# Patient Record
Sex: Male | Born: 1935 | Race: White | Hispanic: No | Marital: Married | Smoking: Former smoker
Health system: Southern US, Community
[De-identification: ages and names within clinical notes are randomized; demographics above are authoritative.]

## PROBLEM LIST (undated history)

## (undated) DIAGNOSIS — E041 Nontoxic single thyroid nodule: Secondary | ICD-10-CM

## (undated) DIAGNOSIS — IMO0001 Reserved for inherently not codable concepts without codable children: Secondary | ICD-10-CM

## (undated) DIAGNOSIS — E785 Hyperlipidemia, unspecified: Secondary | ICD-10-CM

## (undated) DIAGNOSIS — G4733 Obstructive sleep apnea (adult) (pediatric): Secondary | ICD-10-CM

## (undated) DIAGNOSIS — K219 Gastro-esophageal reflux disease without esophagitis: Secondary | ICD-10-CM

## (undated) DIAGNOSIS — G500 Trigeminal neuralgia: Secondary | ICD-10-CM

## (undated) DIAGNOSIS — I10 Essential (primary) hypertension: Secondary | ICD-10-CM

## (undated) DIAGNOSIS — I350 Nonrheumatic aortic (valve) stenosis: Secondary | ICD-10-CM

## (undated) DIAGNOSIS — E119 Type 2 diabetes mellitus without complications: Secondary | ICD-10-CM

## (undated) HISTORY — DX: Type 2 diabetes mellitus without complications: E11.9

## (undated) HISTORY — DX: Nontoxic single thyroid nodule: E04.1

## (undated) HISTORY — DX: Gastro-esophageal reflux disease without esophagitis: K21.9

## (undated) HISTORY — DX: Nonrheumatic aortic (valve) stenosis: I35.0

## (undated) HISTORY — DX: Essential (primary) hypertension: I10

## (undated) HISTORY — DX: Reserved for inherently not codable concepts without codable children: IMO0001

## (undated) HISTORY — DX: Obstructive sleep apnea (adult) (pediatric): G47.33

## (undated) HISTORY — DX: Hyperlipidemia, unspecified: E78.5

## (undated) HISTORY — PX: APPENDECTOMY: SHX54

## (undated) HISTORY — PX: OTHER SURGICAL HISTORY: SHX169

## (undated) HISTORY — DX: Trigeminal neuralgia: G50.0

---

## 1999-04-15 ENCOUNTER — Ambulatory Visit (HOSPITAL_BASED_OUTPATIENT_CLINIC_OR_DEPARTMENT_OTHER): Admission: RE | Admit: 1999-04-15 | Discharge: 1999-04-15 | Payer: Self-pay | Admitting: Plastic Surgery

## 1999-05-03 ENCOUNTER — Ambulatory Visit (HOSPITAL_BASED_OUTPATIENT_CLINIC_OR_DEPARTMENT_OTHER): Admission: RE | Admit: 1999-05-03 | Discharge: 1999-05-03 | Payer: Self-pay | Admitting: Plastic Surgery

## 2000-12-07 ENCOUNTER — Encounter (INDEPENDENT_AMBULATORY_CARE_PROVIDER_SITE_OTHER): Payer: Self-pay | Admitting: Specialist

## 2000-12-07 ENCOUNTER — Other Ambulatory Visit: Admission: RE | Admit: 2000-12-07 | Discharge: 2000-12-07 | Payer: Self-pay | Admitting: Gastroenterology

## 2004-11-15 ENCOUNTER — Ambulatory Visit: Payer: Self-pay | Admitting: Internal Medicine

## 2004-11-18 ENCOUNTER — Ambulatory Visit: Payer: Self-pay | Admitting: Internal Medicine

## 2004-12-13 ENCOUNTER — Ambulatory Visit: Payer: Self-pay | Admitting: Internal Medicine

## 2005-02-06 ENCOUNTER — Ambulatory Visit: Payer: Self-pay | Admitting: Internal Medicine

## 2005-04-03 ENCOUNTER — Ambulatory Visit: Payer: Self-pay | Admitting: Internal Medicine

## 2005-04-24 ENCOUNTER — Encounter: Admission: RE | Admit: 2005-04-24 | Discharge: 2005-04-24 | Payer: Self-pay | Admitting: Neurology

## 2005-07-10 ENCOUNTER — Ambulatory Visit: Payer: Self-pay | Admitting: Internal Medicine

## 2005-07-31 ENCOUNTER — Ambulatory Visit: Payer: Self-pay | Admitting: Internal Medicine

## 2005-10-30 ENCOUNTER — Ambulatory Visit: Payer: Self-pay | Admitting: Internal Medicine

## 2005-12-20 ENCOUNTER — Ambulatory Visit: Payer: Self-pay | Admitting: Internal Medicine

## 2005-12-21 ENCOUNTER — Ambulatory Visit: Payer: Self-pay | Admitting: Internal Medicine

## 2006-01-01 ENCOUNTER — Ambulatory Visit: Payer: Self-pay | Admitting: Internal Medicine

## 2006-01-08 ENCOUNTER — Encounter: Payer: Self-pay | Admitting: Internal Medicine

## 2006-01-08 ENCOUNTER — Ambulatory Visit: Payer: Self-pay

## 2006-02-01 ENCOUNTER — Ambulatory Visit: Payer: Self-pay | Admitting: Internal Medicine

## 2006-05-10 ENCOUNTER — Ambulatory Visit (HOSPITAL_BASED_OUTPATIENT_CLINIC_OR_DEPARTMENT_OTHER): Admission: RE | Admit: 2006-05-10 | Discharge: 2006-05-10 | Payer: Self-pay | Admitting: Neurology

## 2006-05-13 ENCOUNTER — Ambulatory Visit: Payer: Self-pay | Admitting: Internal Medicine

## 2006-06-01 ENCOUNTER — Ambulatory Visit: Payer: Self-pay | Admitting: Pulmonary Disease

## 2006-06-29 ENCOUNTER — Ambulatory Visit: Payer: Self-pay | Admitting: Pulmonary Disease

## 2006-08-08 ENCOUNTER — Ambulatory Visit: Payer: Self-pay | Admitting: Internal Medicine

## 2006-08-16 ENCOUNTER — Ambulatory Visit: Payer: Self-pay | Admitting: Internal Medicine

## 2006-10-25 ENCOUNTER — Ambulatory Visit: Payer: Self-pay | Admitting: Internal Medicine

## 2006-11-19 ENCOUNTER — Encounter: Admission: RE | Admit: 2006-11-19 | Discharge: 2006-11-19 | Payer: Self-pay | Admitting: Family Medicine

## 2007-01-18 ENCOUNTER — Encounter: Admission: RE | Admit: 2007-01-18 | Discharge: 2007-01-18 | Payer: Self-pay | Admitting: Neurology

## 2007-02-21 ENCOUNTER — Encounter: Admission: RE | Admit: 2007-02-21 | Discharge: 2007-02-21 | Payer: Self-pay | Admitting: Orthopedic Surgery

## 2007-02-22 ENCOUNTER — Ambulatory Visit: Payer: Self-pay | Admitting: Internal Medicine

## 2007-02-23 ENCOUNTER — Encounter: Admission: RE | Admit: 2007-02-23 | Discharge: 2007-02-23 | Payer: Self-pay | Admitting: Internal Medicine

## 2007-03-01 ENCOUNTER — Ambulatory Visit: Payer: Self-pay | Admitting: Vascular Surgery

## 2007-03-01 ENCOUNTER — Ambulatory Visit (HOSPITAL_COMMUNITY): Admission: RE | Admit: 2007-03-01 | Discharge: 2007-03-01 | Payer: Self-pay | Admitting: Family Medicine

## 2007-03-01 ENCOUNTER — Encounter: Payer: Self-pay | Admitting: Vascular Surgery

## 2007-03-20 ENCOUNTER — Encounter: Admission: RE | Admit: 2007-03-20 | Discharge: 2007-03-20 | Payer: Self-pay | Admitting: Neurology

## 2007-05-17 ENCOUNTER — Encounter: Admission: RE | Admit: 2007-05-17 | Discharge: 2007-05-17 | Payer: Self-pay | Admitting: Neurosurgery

## 2007-05-23 ENCOUNTER — Ambulatory Visit: Payer: Self-pay | Admitting: Internal Medicine

## 2007-05-23 LAB — CONVERTED CEMR LAB
ALT: 20 units/L (ref 0–53)
BUN: 15 mg/dL (ref 6–23)
HDL: 40 mg/dL (ref >39.0)
LDL Cholesterol: 90 mg/dL (ref 0–99)
T3, Free: 2.7 pg/mL (ref 2.3–4.2)
TSH: 4.47 microintl units/mL (ref 0.35–5.50)
Total CHOL/HDL Ratio: 3.8
VLDL: 21 mg/dL (ref 0–40)

## 2007-05-24 ENCOUNTER — Encounter: Admission: RE | Admit: 2007-05-24 | Discharge: 2007-05-24 | Payer: Self-pay | Admitting: Internal Medicine

## 2007-07-01 DIAGNOSIS — K219 Gastro-esophageal reflux disease without esophagitis: Secondary | ICD-10-CM

## 2007-07-01 DIAGNOSIS — E785 Hyperlipidemia, unspecified: Secondary | ICD-10-CM

## 2007-07-08 ENCOUNTER — Encounter: Payer: Self-pay | Admitting: Internal Medicine

## 2007-07-08 ENCOUNTER — Ambulatory Visit: Payer: Self-pay | Admitting: Internal Medicine

## 2007-07-08 DIAGNOSIS — E119 Type 2 diabetes mellitus without complications: Secondary | ICD-10-CM | POA: Insufficient documentation

## 2007-07-08 DIAGNOSIS — G500 Trigeminal neuralgia: Secondary | ICD-10-CM | POA: Insufficient documentation

## 2007-07-12 ENCOUNTER — Ambulatory Visit: Payer: Self-pay | Admitting: Internal Medicine

## 2007-07-13 ENCOUNTER — Encounter: Payer: Self-pay | Admitting: Internal Medicine

## 2007-08-13 ENCOUNTER — Encounter: Payer: Self-pay | Admitting: Internal Medicine

## 2007-08-15 ENCOUNTER — Ambulatory Visit: Payer: Self-pay | Admitting: Internal Medicine

## 2008-04-01 ENCOUNTER — Ambulatory Visit: Payer: Self-pay | Admitting: Internal Medicine

## 2008-04-01 DIAGNOSIS — G4700 Insomnia, unspecified: Secondary | ICD-10-CM

## 2008-04-01 DIAGNOSIS — G4733 Obstructive sleep apnea (adult) (pediatric): Secondary | ICD-10-CM

## 2008-04-01 DIAGNOSIS — F329 Major depressive disorder, single episode, unspecified: Secondary | ICD-10-CM

## 2008-06-09 ENCOUNTER — Ambulatory Visit: Payer: Self-pay | Admitting: Internal Medicine

## 2008-06-09 DIAGNOSIS — R350 Frequency of micturition: Secondary | ICD-10-CM | POA: Insufficient documentation

## 2008-06-09 DIAGNOSIS — R011 Cardiac murmur, unspecified: Secondary | ICD-10-CM | POA: Insufficient documentation

## 2008-06-09 DIAGNOSIS — K117 Disturbances of salivary secretion: Secondary | ICD-10-CM | POA: Insufficient documentation

## 2008-06-11 ENCOUNTER — Ambulatory Visit: Payer: Self-pay | Admitting: Internal Medicine

## 2008-06-11 LAB — CONVERTED CEMR LAB
Alkaline Phosphatase: 67 units/L (ref 39–117)
Calcium: 9 mg/dL (ref 8.4–10.5)
Creatinine, Ser: 1.1 mg/dL (ref 0.4–1.5)
GFR calc non Af Amer: 68 mL/min
Hgb A1c MFr Bld: 6.5 % — ABNORMAL HIGH (ref 4.6–6.0)
LDL Cholesterol: 71 mg/dL (ref 0–99)
PSA: 0.41 ng/mL (ref 0.10–4.00)
Total Bilirubin: 0.8 mg/dL (ref 0.3–1.2)
Total CHOL/HDL Ratio: 3.8
Total Protein: 7.1 g/dL (ref 6.0–8.3)
Triglycerides: 67 mg/dL (ref 0–149)

## 2008-06-12 ENCOUNTER — Ambulatory Visit: Payer: Self-pay

## 2008-06-12 ENCOUNTER — Encounter: Payer: Self-pay | Admitting: Internal Medicine

## 2008-06-16 ENCOUNTER — Encounter: Payer: Self-pay | Admitting: Internal Medicine

## 2008-06-18 ENCOUNTER — Encounter: Payer: Self-pay | Admitting: Internal Medicine

## 2008-06-22 ENCOUNTER — Ambulatory Visit: Payer: Self-pay | Admitting: Pulmonary Disease

## 2008-07-13 ENCOUNTER — Ambulatory Visit: Payer: Self-pay | Admitting: Internal Medicine

## 2008-07-13 ENCOUNTER — Telehealth: Payer: Self-pay | Admitting: Internal Medicine

## 2008-07-13 DIAGNOSIS — H8309 Labyrinthitis, unspecified ear: Secondary | ICD-10-CM | POA: Insufficient documentation

## 2008-08-05 ENCOUNTER — Ambulatory Visit: Payer: Self-pay | Admitting: Internal Medicine

## 2008-08-07 ENCOUNTER — Telehealth: Payer: Self-pay | Admitting: Internal Medicine

## 2008-09-11 ENCOUNTER — Telehealth: Payer: Self-pay | Admitting: Internal Medicine

## 2009-04-03 ENCOUNTER — Telehealth: Payer: Self-pay | Admitting: Family Medicine

## 2009-04-03 ENCOUNTER — Ambulatory Visit: Payer: Self-pay | Admitting: Family Medicine

## 2009-04-03 DIAGNOSIS — IMO0002 Reserved for concepts with insufficient information to code with codable children: Secondary | ICD-10-CM

## 2009-06-30 ENCOUNTER — Ambulatory Visit: Payer: Self-pay | Admitting: Internal Medicine

## 2009-08-04 ENCOUNTER — Ambulatory Visit: Payer: Self-pay | Admitting: Internal Medicine

## 2009-08-04 DIAGNOSIS — I10 Essential (primary) hypertension: Secondary | ICD-10-CM | POA: Insufficient documentation

## 2009-08-04 DIAGNOSIS — R079 Chest pain, unspecified: Secondary | ICD-10-CM | POA: Insufficient documentation

## 2009-08-06 ENCOUNTER — Telehealth: Payer: Self-pay | Admitting: Internal Medicine

## 2009-08-06 ENCOUNTER — Ambulatory Visit: Payer: Self-pay | Admitting: Internal Medicine

## 2009-08-09 ENCOUNTER — Ambulatory Visit: Payer: Self-pay | Admitting: Internal Medicine

## 2009-08-09 DIAGNOSIS — I2782 Chronic pulmonary embolism: Secondary | ICD-10-CM | POA: Insufficient documentation

## 2009-08-09 LAB — CONVERTED CEMR LAB
INR: 1
Prothrombin Time: 9.9 s
aPTT: 27.4 s

## 2009-08-10 ENCOUNTER — Encounter: Payer: Self-pay | Admitting: Internal Medicine

## 2009-08-10 LAB — CONVERTED CEMR LAB
AntiThromb III Func: 104 % (ref 76–126)
Protein C Activity: 129 % (ref 75–133)
Protein S Activity: 25 % — ABNORMAL LOW (ref 69–129)

## 2009-08-15 ENCOUNTER — Telehealth (INDEPENDENT_AMBULATORY_CARE_PROVIDER_SITE_OTHER): Payer: Self-pay | Admitting: *Deleted

## 2009-08-17 ENCOUNTER — Telehealth (INDEPENDENT_AMBULATORY_CARE_PROVIDER_SITE_OTHER): Payer: Self-pay | Admitting: *Deleted

## 2009-08-18 ENCOUNTER — Ambulatory Visit: Payer: Self-pay | Admitting: Internal Medicine

## 2009-08-18 DIAGNOSIS — D6859 Other primary thrombophilia: Secondary | ICD-10-CM

## 2009-08-25 ENCOUNTER — Ambulatory Visit: Payer: Self-pay | Admitting: Oncology

## 2009-08-27 ENCOUNTER — Encounter: Payer: Self-pay | Admitting: Internal Medicine

## 2009-08-27 LAB — CBC & DIFF AND RETIC
BASO%: 0.4 % (ref 0.0–2.0)
LYMPH%: 28.3 % (ref 14.0–49.0)
MCHC: 34.2 g/dL (ref 32.0–36.0)
MCV: 94.3 fL (ref 79.3–98.0)
MONO%: 6.1 % (ref 0.0–14.0)
Platelets: 248 10*3/uL (ref 140–400)
RBC: 4.4 10*6/uL (ref 4.20–5.82)
nRBC: 0 % (ref 0–0)

## 2009-08-27 LAB — MORPHOLOGY: RBC Comments: NORMAL

## 2009-09-01 ENCOUNTER — Encounter: Payer: Self-pay | Admitting: Internal Medicine

## 2009-09-01 LAB — PROTEIN S ACTIVITY: Protein S Activity: 58 % — ABNORMAL LOW (ref 69–129)

## 2009-09-01 LAB — COMPREHENSIVE METABOLIC PANEL
Alkaline Phosphatase: 72 U/L (ref 39–117)
BUN: 19 mg/dL (ref 6–23)
CO2: 20 mEq/L (ref 19–32)
Glucose, Bld: 155 mg/dL — ABNORMAL HIGH (ref 70–99)
Sodium: 139 mEq/L (ref 135–145)
Total Bilirubin: 0.4 mg/dL (ref 0.3–1.2)
Total Protein: 6.9 g/dL (ref 6.0–8.3)

## 2009-09-01 LAB — LACTATE DEHYDROGENASE: LDH: 103 U/L (ref 94–250)

## 2009-09-01 LAB — LUPUS ANTICOAGULANT PANEL

## 2009-09-01 LAB — PROTEIN S, ANTIGEN, FREE: Protein S Ag, Free: 59 % normal (ref 57–171)

## 2010-03-09 ENCOUNTER — Ambulatory Visit: Payer: Self-pay | Admitting: Internal Medicine

## 2010-03-09 DIAGNOSIS — J209 Acute bronchitis, unspecified: Secondary | ICD-10-CM

## 2010-03-14 ENCOUNTER — Telehealth: Payer: Self-pay | Admitting: Internal Medicine

## 2010-03-14 ENCOUNTER — Encounter: Payer: Self-pay | Admitting: Internal Medicine

## 2010-03-17 ENCOUNTER — Ambulatory Visit: Payer: Self-pay | Admitting: Internal Medicine

## 2010-07-22 ENCOUNTER — Ambulatory Visit: Payer: Self-pay | Admitting: Internal Medicine

## 2010-09-12 ENCOUNTER — Telehealth: Payer: Self-pay | Admitting: Internal Medicine

## 2010-10-25 ENCOUNTER — Telehealth: Payer: Self-pay | Admitting: Internal Medicine

## 2010-11-13 ENCOUNTER — Encounter: Payer: Self-pay | Admitting: Orthopedic Surgery

## 2010-11-13 ENCOUNTER — Encounter: Payer: Self-pay | Admitting: Family Medicine

## 2010-11-20 LAB — CONVERTED CEMR LAB
Basophils Absolute: 0 10*3/uL (ref 0.0–0.1)
Basophils Relative: 0.3 % (ref 0.0–3.0)
CK-MB: 2.1 ng/mL (ref 0.3–4.0)
Chloride: 104 meq/L (ref 96–112)
Cholesterol: 130 mg/dL (ref 0–200)
Eosinophils Relative: 4.4 % (ref 0.0–5.0)
HDL: 37.1 mg/dL — ABNORMAL LOW (ref >39.00)
Monocytes Absolute: 0.5 10*3/uL (ref 0.1–1.0)
Potassium: 4 meq/L (ref 3.5–5.1)
RBC: 4.33 M/uL (ref 4.22–5.81)
RDW: 12.8 % (ref 11.5–14.6)
TSH: 3.1 microintl units/mL (ref 0.35–5.50)
Total CK: 90 units/L (ref 7–232)

## 2010-11-22 NOTE — Assessment & Plan Note (Signed)
Summary: cold,cough/cd   Vital Signs:  Patient profile:   77 year old male Height:      68 inches Weight:      215 pounds BMI:     32.81 O2 Sat:      96 % on Room air Temp:     98.0 degrees F oral Pulse rate:   86 / minute BP sitting:   162 / 78  (left arm) Cuff size:   regular  Vitals Entered By: Bill Salinas CMA (Mar 09, 2010 9:36 AM)  O2 Flow:  Room air CC: pt here with c/o cold, cough and head congestion x 8 days/ ab   Primary Care Provider:  Albin Duckett  CC:  pt here with c/o cold and cough and head congestion x 8 days/ ab.  History of Present Illness: The patient is in the office today with an 8-day history of cough, chest congestion and nasal drainage.  At the onset of his symptoms he had sore throat and possibly a fever.  Since then the sore throat and fever have resolved but he has continued to have nasal drainage and productive cough.  He denies hemoptysis, shortness of breath, chest pain, decreased appetite, nausea and vomiting.  He used Mucinex for a few days with questionable benefit and has been using an over the counter cough syrup at night with great benefit.  He does not know of any sick contacts before his illness, but his wife has had similar symptoms for the past five days.  Current Medications (verified): 1)  Metformin Hcl 500 Mg  Tabs (Metformin Hcl) .... Take 1 Tablet By Mouth Two Times A Day 2)  Norvasc 5 Mg  Tabs (Amlodipine Besylate) .... Take 1 Tablet By Mouth Once A Day 3)  Simvastatin 20 Mg Tabs (Simvastatin) .Marland Kitchen.. 1 Once Daily 4)  Cialis 10 Mg  Tabs (Tadalafil) .... Take 1 Tablet By Mouth As Needed 5)  Meclizine Hcl 25 Mg Tabs (Meclizine Hcl) .... 1/2 or 1 Tab Q 6 Hrs Until Symptoms Clear Then Taper Off 6)  Aspirin 325 Mg Tabs (Aspirin) .Marland Kitchen.. 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  The patient denies anorexia, weight loss, weight gain, decreased hearing, chest pain, syncope, dyspnea on exertion, headaches, hemoptysis, abdominal pain, melena, hematochezia, severe indigestion/heartburn, muscle weakness, difficulty walking, unusual weight change, and enlarged lymph nodes.    Physical Exam  General:  alert, well-developed, and well-nourished.   Head:  normocephalic and atraumatic.   Eyes:  vision grossly intact, pupils equal, pupils round, pupils reactive to light, corneas and lenses clear, and no injection.   Ears:  R ear normal and L ear normal.  cerumen noted in bilateral EACs Nose:  no external deformity.   Mouth:  good dentition and pharynx pink and moist.   Neck:  supple and no masses.   Lungs:  normal respiratory effort, no accessory muscle use, normal breath sounds, no dullness, no crackles,  and no wheezes.   Heart:  regular rate and rhythm with a 2/6 holosystolic murmur best heard at the right upper sternal border.  no gallop or rub. Abdomen:  soft, non-tender, normal bowel sounds, no distention, no masses, no guarding, and no rigidity.   Pulses:  R radial normal.   Neurologic:  alert & oriented X3, cranial nerves II-XII intact, and strength normal in all extremities.   Cervical Nodes:  no anterior cervical adenopathy and no posterior cervical adenopathy.     Impression & Recommendations:  Problem # 1:  ACUTE BRONCHITIS (ICD-466.0) Patient's symptoms are consistent with a viral  bronchitis.  He reports some improvement in symptoms over the past week but does continue to complain of cough and congestion.  Plan- Symptomatic treatment with benzonatate and promethazine-codeine syrup.  Recommend hydration, continued use of Mucinex, ecchinacea and Vit C 1500mg  daily.  Patient counseled to call office if he begins to have fever or purulent sputum.    Complete Medication List: 1)  Metformin Hcl 500 Mg Tabs (Metformin hcl) .... Take 1 tablet by mouth two times a day 2)  Norvasc 5 Mg Tabs (Amlodipine besylate) .... Take 1 tablet by mouth once a day 3)  Simvastatin 20 Mg Tabs (Simvastatin) .Marland Kitchen.. 1 once daily 4)  Cialis 10 Mg Tabs (Tadalafil) .... Take 1 tablet by mouth as needed 5)  Meclizine Hcl 25 Mg Tabs (Meclizine hcl) .... 1/2 or 1 tab q 6 hrs until symptoms clear then taper off 6)  Aspirin 325 Mg Tabs (Aspirin) .Marland Kitchen.. 1 by mouth once daily

## 2010-11-22 NOTE — Progress Notes (Signed)
Summary: CALL  Phone Note Call from Patient Call back at [facility] Phone [phone]   Summary of Call: Pt left vm, he does not feel better and was advised to call office if no change. Pt emailed MD Friday. Patient is requesting a call back from MD Initial call taken by: Lamar Sprinkles, CMA,  Mar 14, 2010 11:22 AM  Follow-up for Phone Call        Doxycycline 100mg  two times a day x 7 days for bronchitis.  Follow-up by: Jacques Navy MD,  Mar 14, 2010 1:39 PM  Additional Follow-up for Phone Call Additional follow up Details #1::        Rx sent to pharmacy and pt notified Additional Follow-up by: Rock Nephew CMA,  Mar 14, 2010 2:13 PM    New/Updated Medications: DOXYCYCLINE HYCLATE 100 MG TABS (DOXYCYCLINE HYCLATE) Take 1 tablet by mouth two times a day x7 days Prescriptions: DOXYCYCLINE HYCLATE 100 MG TABS (DOXYCYCLINE HYCLATE) Take 1 tablet by mouth two times a day x7 days  #14 x 0   Entered by:   Rock Nephew CMA   Authorized by:   Jacques Navy MD   Signed by:   Rock Nephew CMA on 03/14/2010   Method used:   Electronically to        [facility] [phone]* (retail)       [address]       [place], [place]  [address]       Ph: [phone] or [phone]       Fax: [phone]   RxID:   [id number]

## 2010-11-22 NOTE — Letter (Signed)
Summary: Email regarding pt's bronchitis/Patient's Spouse  Email regarding pt's bronchitis/Patient's Spouse   Imported By: Sherian Rein 03/24/2010 10:31:40  _____________________________________________________________________  External Attachment:    Type:   Image     Comment:   External Document

## 2010-11-22 NOTE — Assessment & Plan Note (Signed)
Summary: FLU Jeremy Stone [name]   Nurse Visit   Allergies: No Known Drug Allergies  Orders Added: 1)  Flu Vaccine 5yrs + MEDICARE PATIENTS [Q2039] 2)  Administration Flu vaccine - MCR [G0008] Flu Vaccine Consent Questions     Do you have a history of severe allergic reactions to this vaccine? no    Any prior history of allergic reactions to egg and/or gelatin? no    Do you have a sensitivity to the preservative Thimersol? no    Do you have a past history of Guillan-Barre Syndrome? no    Do you currently have an acute febrile illness? no    Have you ever had a severe reaction to latex? no    Vaccine information given and explained to patient? yes    Are you currently pregnant? no    Lot Number:[id number]   Exp Date:04/22/2011   Site Given  Left Deltoid IM.lbmedflu

## 2010-11-22 NOTE — Progress Notes (Signed)
Summary: refills  Phone Note Call from Patient Call back at Home Phone [phone]   Caller: Patient Call For: Dr Debby Bud Summary of Call: Pt requests Simvastatin 20 mg,Amlopipile 5 mg,Metformin 500 mg be phoned in to [facility] - [phone] phone. Pt not out yet but will be by end of the is week. If we phone in today they will ship to pt today so he will have by end of the week. Initial call taken by: Verdell Face,  September 12, 2010 9:01 AM  Follow-up for Phone Call        RX called in to pharmacy given per pt.Alvy Beal Archie CMA  September 12, 2010 10:06 AM     Prescriptions: SIMVASTATIN 20 MG TABS (SIMVASTATIN) 1 once daily  #90 x 3   Entered by:   Rock Nephew CMA   Authorized by:   Jacques Navy MD   Signed by:   Rock Nephew CMA on 09/12/2010   Method used:   Telephoned to ...       [facility]* (mail-order)       [address]       [place], [place]  [address]       Ph: [phone]       Fax: [phone]   RxID:   [id number] NORVASC 5 MG  TABS (AMLODIPINE BESYLATE) Take 1 tablet by mouth once a day  #90 x 3   Entered by:   Rock Nephew CMA   Authorized by:   Jacques Navy MD   Signed by:   Rock Nephew CMA on 09/12/2010   Method used:   Telephoned to ...       [facility]* (mail-order)       [address]       [place], [place]  [address]       Ph: [phone]       Fax: [phone]   RxID:   [id number] METFORMIN HCL 500 MG  TABS (METFORMIN HCL) Take 1 tablet by mouth two times a day  #180 x 3   Entered by:   Rock Nephew CMA   Authorized by:   Jacques Navy MD   Signed by:   Rock Nephew CMA on 09/12/2010   Method used:   Telephoned to ...       [facility]* (mail-order)       [address]       [place], [place]  [address]       Ph: [phone]       Fax: [phone]   RxID:   [id number]

## 2010-11-22 NOTE — Assessment & Plan Note (Signed)
Summary: fell hit head/cd -- men pt   Vital Signs:  Patient profile:   77 year old male Height:      68 inches Weight:      215 pounds BMI:     32.81 O2 Sat:      96 % on Room air Temp:     97.8 degrees F oral Pulse rate:   89 / minute BP sitting:   148 / 80  (left arm) Cuff size:   regular  Vitals Entered By: Bill Salinas CMA (Mar 17, 2010 9:16 AM)  O2 Flow:  Room air CC: pt here for evaluation  after falling while gardening and hitting his head on a brick wall/ ab   Primary Care Provider:  norins  CC:  pt here for evaluation  after falling while gardening and hitting his head on a brick wall/ ab.  History of Present Illness: Patient lost his footing while working in is garden. He struck his head, arms and leg sustaining a deep abrasion to the scalp, proximal right UE and a superficial abrasion to the right forearm. He had a lot of bleeding. He did not loose consciousness or have any neurologic symptoms. He cleaned his wounds with peroxide and applied bandaid.   He is completing doxycycline. His cold is better. He has a scant cough and no productive sputum.   Current Medications (verified): 1)  Metformin Hcl 500 Mg  Tabs (Metformin Hcl) .... Take 1 Tablet By Mouth Two Times A Day 2)  Norvasc 5 Mg  Tabs (Amlodipine Besylate) .... Take 1 Tablet By Mouth Once A Day 3)  Simvastatin 20 Mg Tabs (Simvastatin) .Marland Kitchen.. 1 Once Daily 4)  Cialis 10 Mg  Tabs (Tadalafil) .... Take 1 Tablet By Mouth As Needed 5)  Meclizine Hcl 25 Mg Tabs (Meclizine Hcl) .... 1/2 or 1 Tab Q 6 Hrs Until Symptoms Clear Then Taper Off 6)  Aspirin 325 Mg Tabs (Aspirin) .Marland Kitchen.. 1 By Mouth Once Daily 7)  Doxycycline Hyclate 100 Mg Tabs (Doxycycline Hyclate) .... Take 1 Tablet By Mouth Two Times A Day X7 Days  Allergies (verified): No Known Drug Allergies PMH-FH-SH reviewed-no changes except otherwise noted  Review of Systems Neuro:  Denies brief paralysis, difficulty with concentration, disturbances in coordination,  headaches, inability to speak, memory loss, numbness, poor balance, sensation of room spinning, visual disturbances, and weakness.  Physical Exam  General:  Well-developed,well-nourished,in no acute distress; alert,appropriate and cooperative throughout examination Head:  abrasion on head. No deformity or severe traumatic lesion Eyes:  corneas and lenses clear and no injection.   Lungs:  normal respiratory effort and normal breath sounds.   Heart:  normal rate and regular rhythm.   Msk:  normal ROM and no joint instability.   Pulses:  2+ radial Neurologic:  alert & oriented X3, cranial nerves II-XII intact, and gait normal.   Skin:  3 x 3 cm abrasion frontal scalp. No exudate. MIld swelling. Abrasion/lac proximal Right UE just above the elbow. Clean appearing wound. Abrasion on right forearm - not bleeding. Abrasion on right leg.  Cervical Nodes:  no anterior cervical adenopathy and no posterior cervical adenopathy.   Psych:  Oriented X3, memory intact for recent and remote, normally interactive, and good eye contact.     Impression & Recommendations:  Problem # 1:  FCE NCK&SCLP NO EYE SUP FB NO MAJ OPN WND-INF (ICD-910.7) patient with superficial abrasions. He is neurologically intact. NO evidence of infection. He is current with tetnus.  Plan -  soap and water wash to all abrasions.           neosporin is optional           bandaid is ok for a day or two then open to the air.          call for any fever, drainage or change in wounds.   Complete Medication List: 1)  Metformin Hcl 500 Mg Tabs (Metformin hcl) .... Take 1 tablet by mouth two times a day 2)  Norvasc 5 Mg Tabs (Amlodipine besylate) .... Take 1 tablet by mouth once a day 3)  Simvastatin 20 Mg Tabs (Simvastatin) .Marland Kitchen.. 1 once daily 4)  Cialis 10 Mg Tabs (Tadalafil) .... Take 1 tablet by mouth as needed 5)  Meclizine Hcl 25 Mg Tabs (Meclizine hcl) .... 1/2 or 1 tab q 6 hrs until symptoms clear then taper off 6)  Aspirin 325 Mg  Tabs (Aspirin) .Marland Kitchen.. 1 by mouth once daily 7)  Doxycycline Hyclate 100 Mg Tabs (Doxycycline hyclate) .... Take 1 tablet by mouth two times a day x7 days   Immunization History:  Tetanus/Td Immunization History:    Tetanus/Td:  historical (12/23/2006)

## 2010-11-24 NOTE — Progress Notes (Signed)
Summary: RFs  Phone Note Call from Patient   Summary of Call: [facility]eds [facility]. Req a call when complete.  Initial call taken by: Lamar Sprinkles, CMA,  October 25, 2010 11:31 AM  Follow-up for Phone Call        Patient notified.Alvy Beal Archie CMA  October 25, 2010 3:08 PM     Prescriptions: SIMVASTATIN 20 MG TABS (SIMVASTATIN) 1 once daily  #90 x 3   Entered by:   Lamar Sprinkles, CMA   Authorized by:   Jacques Navy MD   Signed by:   Lamar Sprinkles, CMA on 10/25/2010   Method used:   Faxed to ...       [facility]* (retail)       [address] [place], [place]  [address]       Ph: [phone]       Fax: [phone]   RxID:   [id number] NORVASC 5 MG  TABS (AMLODIPINE BESYLATE) Take 1 tablet by mouth once a day  #90 x 3   Entered by:   Lamar Sprinkles, CMA   Authorized by:   Jacques Navy MD   Signed by:   Lamar Sprinkles, CMA on 10/25/2010   Method used:   Faxed to ...       [facility]* (retail)       [address] [place], [place]  [address]       Ph: [phone]       Fax: [phone]   RxID:   [id number] METFORMIN HCL 500 MG  TABS (METFORMIN HCL) Take 1 tablet by mouth two times a day  #180 x 3   Entered by:   Lamar Sprinkles, CMA   Authorized by:   Jacques Navy MD   Signed by:   Lamar Sprinkles, CMA on 10/25/2010   Method used:   Faxed to ...       [facility]* (retail)       [address] [place], [place]  [address]       Ph: [phone]       Fax: [phone]   RxID:   [id number]

## 2010-12-30 ENCOUNTER — Ambulatory Visit (INDEPENDENT_AMBULATORY_CARE_PROVIDER_SITE_OTHER): Payer: Medicare PPO | Admitting: Internal Medicine

## 2010-12-30 ENCOUNTER — Encounter: Payer: Self-pay | Admitting: Internal Medicine

## 2010-12-30 DIAGNOSIS — H8309 Labyrinthitis, unspecified ear: Secondary | ICD-10-CM

## 2011-01-10 NOTE — Assessment & Plan Note (Signed)
Summary: DIZZY LAST PM:  12/28/10---STC   Vital Signs:  Patient profile:   77 year old male Height:      68 inches (172.72 cm) Weight:      217 pounds (98.64 kg) BMI:     33.11 O2 Sat:      94 % on Room air Temp:     97.4 degrees F (36.33 degrees C) oral Pulse rate:   98 / minute Resp:     16 per minute BP sitting:   138 / 78  (left arm) Cuff size:   large  Vitals Entered By: Bill Salinas CMA (December 30, 2010 4:08 PM)  O2 Flow:  Room air CC: F/U Labrynthitis.Pt c/o dizzy spell Wednesday night/sls,cma Comments Pt needs Rx refill Cialis   Primary Care Provider:  norins  CC:  F/U Labrynthitis.Pt c/o dizzy spell Wednesday night/sls and cma.  History of Present Illness: Patient presents because of an episode several days ago of severe dizziness and atxia. this started at night while in bed: room spinning dizziness. He was able to get out of bed and had continues dysequilbirium. He had no focal symptoms: no diploplia, focal weakness, loss of strength, loss of feelng. He was able to sleep in his reclner. He did have some intiial diaphoresis but this cleared. He did take meclizine with good results. Over the next 24 hrs his symptoms resolved.   He is otherwise doing well.   Current Medications (verified): 1)  Metformin Hcl 500 Mg  Tabs (Metformin Hcl) .... Take 1 Tablet By Mouth Two Times A Day 2)  Norvasc 5 Mg  Tabs (Amlodipine Besylate) .... Take 1 Tablet By Mouth Once A Day 3)  Simvastatin 20 Mg Tabs (Simvastatin) .Marland Kitchen.. 1 Once Daily 4)  Cialis 10 Mg  Tabs (Tadalafil) .... Take 1 Tablet By Mouth As Needed 5)  Meclizine Hcl 25 Mg Tabs (Meclizine Hcl) .... 1/2 or 1 Tab Q 6 Hrs Until Symptoms Clear Then Taper Off 6)  Aspirin 325 Mg Tabs (Aspirin) .Marland Kitchen.. 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    Physical Exam  General:  Well-developed,well-nourished,in no acute distress; alert,appropriate and cooperative throughout examination Head:  normocephalic and atraumatic.   Eyes:  vision grossly intact.   Neck:  supple.   Lungs:  normal respiratory effort.   Heart:  normal rate and regular rhythm.   Neurologic:  alert & oriented X3, cranial nerves II-XII intact, strength normal in all extremities, gait normal, and DTRs symmetrical and normal.   Skin:  turgor normal and color normal.   Cervical Nodes:  no anterior cervical adenopathy and no posterior cervical adenopathy.   Psych:  Oriented X3, memory intact for recent  and remote, normally interactive, and good eye contact.     Impression & Recommendations:  Problem # 1:  LABRYNTHITIS (ICD-386.30) Acute episode of labyrinthits by history with a negative exam.  Plan - no further work-up indicated at this time.   Complete Medication List: 1)  Metformin Hcl 500 Mg Tabs (Metformin hcl) .... Take 1 tablet by mouth two times a day 2)  Norvasc 5 Mg Tabs (Amlodipine besylate) .... Take 1 tablet by mouth once a day 3)  Simvastatin 20 Mg Tabs  (Simvastatin) .Marland Kitchen.. 1 once daily 4)  Cialis 10 Mg Tabs (Tadalafil) .... Take 1 tablet by mouth as needed 5)  Meclizine Hcl 25 Mg Tabs (Meclizine hcl) .... 1/2 or 1 tab q 6 hrs until symptoms clear then taper off 6)  Aspirin 325 Mg Tabs (Aspirin) .Marland Kitchen.. 1 by mouth once daily   Orders Added: 1)  Est. Patient Level III [[address]]

## 2011-03-07 ENCOUNTER — Encounter: Payer: Self-pay | Admitting: Internal Medicine

## 2011-03-07 NOTE — Assessment & Plan Note (Signed)
[facility]                                 ON-CALL NOTE   NAME:LOWENSTEINChasen, Mendell                  MRN:          [id number]  DATE:02/22/2007                            DOB:          [birth year]    DATE OF INTERACTION:  Feb 22, 2007 at 6:32 p.m.   Phone number is [phone] and the caller was Angie at [facility]  The  patient had an after hours BMP drawn at 5:55 p.m. today with all numbers  normal except for a glucose of 115.  She does not know site that  generated the blood draw.  Primary care Norene Oliveri is unknown.  I told her  to fax the results to both [facility] and [facility].  A glucose of 115 is  easily tolerable.  Primary care Devaunte Gasparini is Dr. Debby Bud.  Home office is  [place].     Arta Silence, MD  Electronically Signed    RNS/MedQ  DD: 02/22/2007  DT: 02/22/2007  Job #: [id number]

## 2011-03-07 NOTE — Assessment & Plan Note (Signed)
[facility]                           PRIMARY CARE OFFICE NOTE   NAME:Jeremy Stone, Jeremy Stone                  MRN:          [id number]  DATE:05/23/2007                            DOB:          1926/10/15    HISTORY OF PRESENT ILLNESS:  Jeremy Stone is a 77 year old gentleman  well known to me who presents for followup evaluation and examination.  He was last seen on Feb 22, 2007. He is being evaluated for lower  extremity swelling. In the interval, since that visit, the patient has  been moving forward with treatment for his trigeminal neuralgia. He is  currently scheduled for a microvascular decompression procedure at [facility] by Jeremy Stone July 04, 2007. He has had  recent MRI on May 17, 2007, which did re-confirm previous studies,  which showed that there is a tortuous vessel with clear pressure on the  trigeminal nerve base. The patient reports that his surgeon has got a  very high success rate with very durable results. The patient is moving  in this direction because of difficulty with medical management and side  effects from medications.   The patient did have a study for a carotid occlusion in March of 2008,  which showed no significant internal carotid artery stenosis. Incidental  finding revealed multiple small thyroid nodules and a dedicated thyroid  ultrasound was recommended. This has been scheduled for the patient for  May 24, 2007.   PAST MEDICAL HISTORY:  Is well documented in my note of June 01, 2006  with no changes except as above.   CURRENT MEDICATIONS:  Metformin 500 mg b.i.d., Norvasc 5 mg daily,  Gabapentin 800 mg q.i.d., aspirin 81 mg daily, multivitamin daily, CQ10  daily, Lipitor 20 mg daily, Trileptal 100 mg daily.   SOCIAL HISTORY:  The patient's marriage is in good health. 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 He  is very pleasant with this decision and is happy to move forward.   CHART REVIEW:  The patient's last colonoscopy was January 22, 2003 with  diverticulosis and hemorrhoids and it was recommended that he have a  followup study April 2009. The patient had Zostervax August 16, 2006.  He has had a DP in 1993. The patient would be a candidate for pneumonia  vaccine.   REASON FOR ADMISSION:  The patient has had no fevers, sweats, or chills.  He has had no ophthalmic, cardiovascular, respiratory, GI, GU  complaints. Musculoskeletal is stable. Neurologic is positive for his  trigeminal neuralgia.   PHYSICAL EXAMINATION:  VITAL SIGNS:  Temperature 98.3, blood pressure  157/86, pulse 82, weight 230 pounds.  GENERAL:  This is an overweight Caucasian gentleman who looks younger  than his stated age. No acute distress.  HEENT:  Normocephalic and atraumatic. EAC with some cerumen but no  impaction. TM's were normal. Oropharynx revealed the patient did have  several teeth missing but the remaining dentition is in good repair. No  buccal lesions were noted. Posterior pharynx was clear. Conjunctivae and  sclerae were clear. Pupils are equal, round, and reactive to light and  accommodation. Funduscopic examination with hand held instrument  revealed normal disk margins with no vascular abnormality.  NECK:  Supple. He has an easily palpable thyroid gland with no nodular  abnormalities.  NODES:  No adenopathy was noted in the submandibular, cervical,  supraclavicular regions.  CHEST:  No deformities are noted. He has mild increased AV diameter.  LUNGS:  Clear with no rales, wheezes, or rhonchi.  CARDIOVASCULAR:  He had 2+ radial pulses. He has no JVD. I did not  appreciate any carotid bruits. He had a quiet precordium with a regular  rate and rhythm.  ABDOMEN:  Obese. He had positive bowel sounds in all 4 quadrants. No  guarding or rebound was  appreciated.  RECTAL:  The patient has a hemorrhoidal skin tag at the 4:00 o'clock  position. Sphincter tone was normal Prostate was smooth and normal size  and contour without nodules.  EXTREMITIES:  Without clubbing, cyanosis, or edema. No deformities were  noted. There is no residual swelling.   LABORATORY DATA:  Ordered and pending. Included PSA, A1C, lipid panel,  AST, ALT, BUN, creatinine, potassium, TSH, free T4 and T3.   ASSESSMENT:  1. Trigeminal neuralgia. The patient is scheduled for microvascular      decompression procedure for relief of his symptoms. He will      continue medications at this time and discontinue as instructed by      his surgeon and by Jeremy Stone.  2. Hypertension. The patient's blood pressure is mildly elevated at      today's visit. At his last visit on Feb 22, 2007, blood pressure was      150/87. At this last physical examination on June 01, 2006, his      blood pressure was 136/72.   PLAN:  1. Will continue the patient on his present regimen of Norvasc 5. I      would ask him to check his blood pressure's at home on a regular      basis and if he continues to have systolic blood pressure's greater      than 140, we need to make adjustments to his medication, with the      first step being to increased Norvasc to 10 mg daily and/or      consider adding a diuretic.  2. Diabetes. The patient reports that he has been well controlled and      has been diet compliant. Last A1C February 01, 2006 was 6.4%.      Laboratory is pending.  The patient to continue on his present      medications. Will adjust as needed based on today's laboratory.  3. Hyperlipidemia. The patient's last lipid profile from December 20, 2005 showed a total cholesterol of 118, HDL was 41.2, LDL      cholesterol was 66. Followup laboratory is ordered and pending.      Will adjust his medications as indicated.  4. Thyroid nodule. The patient had an unremarkable examination. The       last 2 years, his TSH has been normal. The patient is scheduled for      a thyroid ultrasound at [facility], [address] for      Friday, May 24, 2007 and the patient is aware of his appointment.  5. Health maintenance. The patient is current  and up to date with      colorectal cancer screening. PSA is ordered and pending. His      immunization status is short of a Pneumovax vaccine, which we will      be happy to do at his convenience.   In summary, he is a very pleasant gentleman. He is prepared for surgery.  I believe there are no contraindication to surgery and need for any  cardiac evaluation or testing.   I have asked the patient to forward records to me from [facility]  as he proceeds with his treatment.     Rosalyn Gess Norins, MD  Electronically Signed    MEN/MedQ  DD: 05/23/2007  DT: 05/23/2007  Job #: [id number]   cc:   Rene Kocher, M.D.

## 2011-03-09 ENCOUNTER — Ambulatory Visit (INDEPENDENT_AMBULATORY_CARE_PROVIDER_SITE_OTHER): Payer: Medicare PPO | Admitting: Internal Medicine

## 2011-03-09 ENCOUNTER — Other Ambulatory Visit (INDEPENDENT_AMBULATORY_CARE_PROVIDER_SITE_OTHER): Payer: Medicare PPO

## 2011-03-09 ENCOUNTER — Encounter: Payer: Self-pay | Admitting: Internal Medicine

## 2011-03-09 VITALS — BP 136/70 | HR 82 | Temp 97.4°F | Wt 218.0 lb

## 2011-03-09 DIAGNOSIS — E119 Type 2 diabetes mellitus without complications: Secondary | ICD-10-CM

## 2011-03-09 DIAGNOSIS — E785 Hyperlipidemia, unspecified: Secondary | ICD-10-CM

## 2011-03-09 DIAGNOSIS — I10 Essential (primary) hypertension: Secondary | ICD-10-CM

## 2011-03-09 DIAGNOSIS — Z136 Encounter for screening for cardiovascular disorders: Secondary | ICD-10-CM

## 2011-03-09 DIAGNOSIS — K219 Gastro-esophageal reflux disease without esophagitis: Secondary | ICD-10-CM

## 2011-03-09 DIAGNOSIS — Z0000 Encounter for general adult medical examination without abnormal findings: Secondary | ICD-10-CM

## 2011-03-09 LAB — CBC WITH DIFFERENTIAL/PLATELET
Eosinophils Absolute: 0.2 10*3/uL (ref 0.0–0.7)
Hemoglobin: 14.4 g/dL (ref 13.0–17.0)
Lymphocytes Relative: 33.4 % (ref 12.0–46.0)
MCV: 98 fl (ref 78.0–100.0)
Neutro Abs: 4.9 10*3/uL (ref 1.4–7.7)
Neutrophils Relative %: 55.4 % (ref 43.0–77.0)
Platelets: 272 10*3/uL (ref 150.0–400.0)
RDW: 13.6 % (ref 11.5–14.6)

## 2011-03-09 LAB — HEPATIC FUNCTION PANEL
Albumin: 3.8 g/dL (ref 3.5–5.2)
Bilirubin, Direct: 0.1 mg/dL (ref 0.0–0.3)
Total Protein: 6.7 g/dL (ref 6.0–8.3)

## 2011-03-09 LAB — LIPID PANEL
Cholesterol: 110 mg/dL (ref 0–200)
Total CHOL/HDL Ratio: 3

## 2011-03-09 LAB — COMPREHENSIVE METABOLIC PANEL
ALT: 14 U/L (ref 0–53)
BUN: 16 mg/dL (ref 6–23)
Calcium: 9.4 mg/dL (ref 8.4–10.5)
Creatinine, Ser: 1 mg/dL (ref 0.4–1.5)
Glucose, Bld: 80 mg/dL (ref 70–99)
Potassium: 4.6 mEq/L (ref 3.5–5.1)

## 2011-03-09 NOTE — Progress Notes (Signed)
Subjective:    Patient ID: Jeremy Stone, male    DOB: 18-Mar-1926, 77 y.o.   MRN: [id number]  HPI  The patient is here for annual Medicare wellness examination and management of other chronic and acute problems. He has been feelings well and doing well. He recently had excision of a basal cell skin cancer from the right frontal scalp at the dermatology surgical center. He developed swelling and erythema in the right infraorbital area. His surgeon was not concerned and stated this is frequently seen post-op. There is no pain or tenderness associated with this.     The risk factors are reflected in the social history.  The roster of all physicians providing medical care to patient - is listed in the Snapshot section of the chart.  Activities of daily living:  The patient is 100% inedpendent in all ADLs: dressing, toileting, feeding as well as independent mobility. 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   Home safety : The patient has smoke detectors in the home. They wear seatbelts. No firearms at home . There is no violence in the home. He has had no falls and has no fall risk.   There is no risks for hepatitis, STDs or HIV. There is no   history of blood transfusion. They have no travel history to infectious disease endemic areas of the world.  The patient not seen their dentist in the last six month. They have  seen their eye doctor in the last year. They deny  any hearing difficulty and have not had audiologic testing in the last year.  They do not  have excessive sun exposure. Discussed the need for sun protection: hats, long sleeves and use of sunscreen if there is significant sun exposure.   Diet: the importance of a healthy diet is discussed. They do have a healthy diet.  Exercise: no regular exercise program presently.  Past Medical History  Diagnosis Date  .  NIDDM (non-insulin dependent diabetes mellitus)   . Hyperlipidemia   . Thyroid nodule   . Hypertension   . Trigeminal neuralgia   . GERD (gastroesophageal reflux disease)   . OSA (obstructive sleep apnea)    Past Surgical History  Procedure Date  . Appendectomy   . Trigeminal surgery     07-22-07- [facility]   Family History  Problem Relation Age of Onset  . Dementia Sister    History   Social History  . Marital Status: Married    Spouse Name: N/A    Number of Children: N/A  . Years of Education: 16   Occupational History  . businessman    Social History Main Topics  . Smoking status: Former Smoker -- 1.0 packs/day for 20 years    Types: Cigarettes  . Smokeless tobacco: Never Used  . Alcohol Use: 5.0 oz/week    10 drink(s) per week  . Drug Use: No  . Sexually Active: Yes -- Male partner(s)   Other Topics Concern  . 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 Married  And divorced - 2 children: 1 son - Engineer, petroleum now in [place]; 1 dtr , several grandhchildren. 2nd marriage - doing well.        Review of Systems Review of Systems  Constitutional:  Negative for fever, chills, activity change and unexpected weight change.  HENT:  Negative for hearing loss, ear pain, congestion, neck stiffness and postnasal drip.   Eyes: Negative for pain, discharge and visual disturbance.  Respiratory: Negative for chest tightness and wheezing.   Cardiovascular: Negative for chest pain and palpitations.       [No decreased exercise tolerance Gastrointestinal: [No change in bowel habit. No bloating or gas. No reflux or indigestion Genitourinary: Negative for urgency, frequency, flank pain and difficulty urinating.  Musculoskeletal: Negative for myalgias, back pain, arthralgias and gait problem.  Neurological: Negative for dizziness, tremors, weakness and headaches.  Hematological: Negative for adenopathy.    Psychiatric/Behavioral: Negative for behavioral problems and dysphoric mood.       Objective:   Physical Exam Constitutional: He is oriented to person, place, and time. He appears well-developed and well-nourished.       Healthy appearing white male in no acute distress  HENT:  Head: Normocephalic and atraumatic. Well healed surgical scar right frontal scalp Right Ear: External ear normal. EAC/TM nl Left Ear: External ear normal.  EAC/TM nl Nose: Nose normal.  Mouth/Throat: Oropharynx is clear and moist.  Eyes: Conjunctivae and EOM are normal. Pupils are equal, round, and reactive to light. Right eye exhibits no discharge. There is a red and swollen hematoma under the right eye.  Left eye exhibits no discharge. No scleral icterus.  Neck: Normal range of motion. Neck supple. No JVD present. No tracheal deviation present. No thyromegaly present.  Cardiovascular: Normal rate, regular rhythm and normal heart sounds.  Exam reveals no gallop and no friction rub.   II/VII Systolic murmur best at RSB and at LSB.      Quiet precordium. 2+ radial and DP pulses  Pulmonary/Chest: Effort normal. No respiratory distress. He has no wheezes. He has no rales. He exhibits no tenderness.       No chest wall deformity  Abdominal: Soft. Bowel sounds are normal. He exhibits no distension. There is no tenderness. There is no rebound and no guarding.       No heptosplenomegaly  Genitourinary: Deferred  Musculoskeletal: Normal range of motion. He exhibits no edema and no tenderness.       Small and large joints without redness, synovial thickening or deformity. Full range of motion preserved about all small, median and large joints.  Lymphadenopathy:    He has no cervical adenopathy.  Neurological: He is alert and oriented to person, place, and time. He has normal reflexes. No cranial nerve deficit. Coordination normal.  Skin: Skin is warm and dry. No rash noted. No erythema. See Eye and head exam  above. Psychiatric: He has a normal mood and affect. His behavior is normal. Thought content normal.   Lab Results  Component Value Date   WBC 8.8 03/09/2011   HGB 14.4 03/09/2011   HCT 41.6 03/09/2011   PLT 272.0 03/09/2011   CHOL 110 03/09/2011   TRIG 116.0 03/09/2011   HDL 37.40* 03/09/2011   ALT 14 03/09/2011   ALT 14 03/09/2011   AST 17 03/09/2011   AST 17 03/09/2011   NA 141 03/09/2011   K 4.6 03/09/2011   CL 107 03/09/2011   CREATININE 1.0 03/09/2011   BUN 16 03/09/2011   CO2 29 03/09/2011   TSH 3.56 03/09/2011   PSA 0.41 06/11/2008   INR 1.0 ratio 08/09/2009   HGBA1C 6.7* 03/09/2011   Lab Results  Component Value Date   LDLCALC 49 03/09/2011          Assessment & Plan:  1. Hypertension - good  control on present regimen. Lab results are normal.  Plan - continue preset medications  2. Hyperlipidemia - excellent control on present medication - definitely below goal with no adverse medication side-effects.  Plan - continue present meds.  3. Diabetes - tolerating metformin well. A1C is below goal of 7%.  Pla - continue present medications.   4. Health maintenance - interval history notable for skin surgery otherwise very stable. Physical exam is unremarkable (per J.Brooks MSIII). Lab results are very good. He is current with colorectal surgery with last colonoscopy 4 years ago. At his current age no further studies are recommended. Also not a candidate for further screening for prostate cancer. Immunizations - current with Tetanus, pneumonia and shingles vaccine. 12 lead EKG without signs of acute change, ischemia or injury.   In summary -a very nice man who is medically stable and in good health. He is encouraged to develop a regular exercise program: 30 minutes of aerobic exercise three times a week. He will return as needed or in 6 months for Diabetes follow-up.

## 2011-03-10 NOTE — Assessment & Plan Note (Signed)
HEALTHCARE                               PULMONARY OFFICE NOTE   NAME:Jeremy Stone, Jeremy Stone                  MRN:          [id number]  DATE:06/01/2006                            DOB:          May 23, 1926    HISTORY OF PRESENT ILLNESS:  The patient is a 77 year old gentleman who I  have been asked to see for obstructive sleep apnea. The patient underwent  nocturnal polysomnography in July of 2007 where by split night protocol he  was found to have severe obstructive sleep apnea with 52 events per hour  prior to CPAP initiation. He was then placed on CPAP and ultimately titrated  to 14 cm with a fairly good response. He still had some breakthrough apneas  to the tune of 4 events per hour. The patient states that he typically goes  to bed between 11:00 and 11:30 and gets up at 6:30 to start his day. He  feels tired most mornings whenever he awakens. He has been noted to have  loud snoring but no one has ever mentioned pauses in his breathing during  sleep. The patient states that he has some sleepiness during the day with  periods of activity. He will definitely fall asleep while watching TV and  will often take a nap in the afternoon. The patient initially thought this  may be due to his carbapenems. The patient describes no problems with his  alertness while driving short or long distances. Of note, the patient's  weight is up about 10 pounds over the last few years.   PAST MEDICAL HISTORY:  1. Hypertension.  2. Status post appendectomy.  3. History of trigeminal neuralgia.  4. History of dyslipidemia.  5. History of diabetes.   MEDICATIONS:  1. Metformin 500 mg b.i.d.  2. Norvasc 5 mg daily.  3. Lipitor 20 mg daily.  4. Carbapenems 800 mg t.i.d.   ALLERGIES:  THE PATIENT HAS NO KNOWN DRUG ALLERGIES.   SOCIAL HISTORY:  He is married. He has a history of smoking 1 pack per day  for 30 years. He has not smoked in 25 years.   FAMILY HISTORY:   Totally noncontributory, according to the patient.   REVIEW OF SYSTEMS:  As per history of present illness. Also see patient  intake form documented in the chart.   PHYSICAL EXAMINATION:  GENERAL: He is an overweight male in no acute  distress.  VITAL SIGNS: Blood pressure 136/72, pulse 73, temperature 97.9, weight 224  pounds, oxygen saturation on room air 96%. He is 5 feet 10 inches tall.  HEENT: Pupils equal, round and reactive to light and accommodation.  Extraocular movements are intact. Nares show mild septal deviation to the  right but patent. Oropharynx does show elongation of the soft palate and  uvula. Neck is supple without JVD or lymphadenopathy. There is no palpable  thyromegaly. Chest is totally clear. Cardiac examination reveals regular  rate and rhythm with a 1/6 systolic murmur. Abdomen is soft, nontender, with  good bowel sounds. Genital exam, rectal exam and breast exam were not done  and not indicated. Lower extremities are  without edema. Good pulses  distally. There is no calf tenderness. Neurologically he is alert and  oriented with no evidence of motor deficits.   IMPRESSION:  Severe obstructive sleep apnea documented by nocturnal  polysomnography. The patient clearly is symptomatic during the day and does  have weight to lose as well as abnormal upper airway anatomy. I had a long  discussion with him about the pathophysiology of sleep apnea including the  short term quality of life issues and the long term cardiovascular issues.  At this point in time I think weight loss along coupled with CPAP would be  in his best interest. The patient is agreeable to this approach.   PLAN:  1. Will initiate CPAP starting at 10 cm and will escalate once this is      tolerated to 14 cm.  2. Work on weight loss.  3. The patient is to follow up in 4 weeks or sooner if there are problems.                                   Barbaraann Share, MD,FCCP   [facility]/MedQ  DD:   06/26/2006  DT:  06/26/2006  Job #:  [id number]   cc:   Rene Kocher, M.D.  Rosalyn Gess Norins, MD

## 2011-03-10 NOTE — Procedures (Signed)
NAME:  Jeremy Stone, Jeremy Stone NO.:  [id number]   MEDICAL RECORD NO.:  [id number]          PATIENT TYPE:  OUT   LOCATION:  SLEEP CENTER                 FACILITY:  [facility]   PHYSICIAN:  Clinton D. Maple Hudson, M.D. DATE OF BIRTH:  1925/10/27   DATE OF STUDY:                              NOCTURNAL POLYSOMNOGRAM   REFERRING PHYSICIAN:  Dr. Amelia Jo   DATE OF STUDY:  05/10/2006   INDICATIONS FOR STUDY:  Hypersomnia with sleep apnea.   EPWORTH SLEEPINESS SCORE:  11/24. BMI 31.  Weight 218 pounds.   HOME MEDICATIONS:  Norvasc, Lipitor, metformin, gabapentin.   SLEEP ARCHITECTURE:  Short total sleep time, 224 minutes, with sleep  efficiency 62%.  Stage I was 13%, stage II 77%, stages III and IV were  absent, REM 10% of total sleep time.  Sleep latency 51 minutes, REM latency  271 minutes, awake after sleep onset 85 minutes, arousal index increased at  41.3 indicating increased sleep fragmentation.  No bedtime medication was  reported.   RESPIRATORY DATA:  Split study protocol.  Apnea/hypopnea index (AHI, RDI)  51.6 obstructive events per hour indicating moderately severe obstructive  sleep apnea/hypopnea syndrome before CPAP.  There were 50 obstructive  apneas, 1 central apnea and 57 hypopneas before CPAP.  Events were not  positional.  REM AHI 2.6 per hour.  CPAP was titrated to 14 CWP, AHI 3.7 per  hour.  A large Ultra Mirage full-face mask was chosen, with heated  humidifier.   OXYGEN DATA:  Moderately loud snoring with oxygen desaturation to a nadir of  85% before CPAP.  After CPAP control saturation held 92% on room air.   CARDIAC DATA:  Normal sinus rhythm.   MOVEMENT/PARASOMNIA:  Occasional limb jerk with little effect on sleep.   IMPRESSION/RECOMMENDATIONS:  1.  Moderately severe obstructive sleep apnea/hypopnea syndrome, AHI 51.6      per hour with nonpositional events, moderately loud snoring and oxygen      desaturation to a nadir of 85%.  2.  Successful CPAP  titration to 14 CWP, AHI 3.7 per hour.  A large Ultra      Mirage full-face mask was chosen, with heated humidifier.      Clinton D. Maple Hudson, M.D.  Diplomate, Biomedical engineer of Sleep Medicine  Electronically Signed     CDY/MEDQ  D:  05/13/2006 10:55:12  T:  05/14/2006 00:13:45  Job:  [id number]

## 2011-03-12 ENCOUNTER — Encounter: Payer: Self-pay | Admitting: Internal Medicine

## 2011-03-13 ENCOUNTER — Encounter: Payer: Self-pay | Admitting: Internal Medicine

## 2011-04-27 ENCOUNTER — Encounter: Payer: Self-pay | Admitting: Internal Medicine

## 2011-06-21 ENCOUNTER — Telehealth: Payer: Self-pay | Admitting: *Deleted

## 2011-06-21 NOTE — Telephone Encounter (Signed)
Patient requesting RFs on metformin, amlodipine & simvastatin to go to [facility]. Please call when complete.

## 2011-06-22 MED ORDER — SIMVASTATIN 20 MG PO TABS: 20.0000 mg | ORAL_TABLET | Freq: Every day | ORAL | Status: DC

## 2011-06-22 MED ORDER — METFORMIN HCL 500 MG PO TABS: 500.0000 mg | ORAL_TABLET | Freq: Two times a day (BID) | ORAL | Status: DC

## 2011-06-22 MED ORDER — AMLODIPINE BESYLATE 5 MG PO TABS: 5.0000 mg | ORAL_TABLET | Freq: Every day | ORAL | Status: DC

## 2011-06-22 NOTE — Telephone Encounter (Signed)
Informed pt prescriptions were faxed in

## 2011-08-23 ENCOUNTER — Ambulatory Visit (INDEPENDENT_AMBULATORY_CARE_PROVIDER_SITE_OTHER): Payer: Medicare PPO

## 2011-08-23 DIAGNOSIS — Z23 Encounter for immunization: Secondary | ICD-10-CM

## 2011-09-17 ENCOUNTER — Other Ambulatory Visit: Payer: Self-pay | Admitting: Internal Medicine

## 2012-03-25 ENCOUNTER — Encounter: Payer: Self-pay | Admitting: Internal Medicine

## 2012-03-25 ENCOUNTER — Ambulatory Visit (INDEPENDENT_AMBULATORY_CARE_PROVIDER_SITE_OTHER): Payer: Medicare PPO | Admitting: Internal Medicine

## 2012-03-25 ENCOUNTER — Other Ambulatory Visit (INDEPENDENT_AMBULATORY_CARE_PROVIDER_SITE_OTHER): Payer: Medicare PPO

## 2012-03-25 VITALS — BP 142/80 | HR 84 | Temp 97.1°F | Resp 16 | Wt 212.0 lb

## 2012-03-25 DIAGNOSIS — I10 Essential (primary) hypertension: Secondary | ICD-10-CM

## 2012-03-25 DIAGNOSIS — Z79899 Other long term (current) drug therapy: Secondary | ICD-10-CM

## 2012-03-25 DIAGNOSIS — E119 Type 2 diabetes mellitus without complications: Secondary | ICD-10-CM

## 2012-03-25 DIAGNOSIS — R5381 Other malaise: Secondary | ICD-10-CM

## 2012-03-25 DIAGNOSIS — R011 Cardiac murmur, unspecified: Secondary | ICD-10-CM

## 2012-03-25 DIAGNOSIS — R5383 Other fatigue: Secondary | ICD-10-CM

## 2012-03-25 DIAGNOSIS — E785 Hyperlipidemia, unspecified: Secondary | ICD-10-CM

## 2012-03-25 DIAGNOSIS — G4733 Obstructive sleep apnea (adult) (pediatric): Secondary | ICD-10-CM

## 2012-03-25 LAB — LIPID PANEL
HDL: 40.8 mg/dL (ref >39.00)
Total CHOL/HDL Ratio: 3
VLDL: 33.6 mg/dL (ref 0.0–40.0)

## 2012-03-25 LAB — CBC WITH DIFFERENTIAL/PLATELET
Basophils Absolute: 0 10*3/uL (ref 0.0–0.1)
Basophils Relative: 0.5 % (ref 0.0–3.0)
Eosinophils Relative: 3.2 % (ref 0.0–5.0)
Hemoglobin: 13.9 g/dL (ref 13.0–17.0)
Lymphocytes Relative: 30.3 % (ref 12.0–46.0)
Monocytes Relative: 8 % (ref 3.0–12.0)
Neutro Abs: 4.8 10*3/uL (ref 1.4–7.7)
RBC: 4.24 Mil/uL (ref 4.22–5.81)

## 2012-03-25 LAB — COMPREHENSIVE METABOLIC PANEL
Alkaline Phosphatase: 67 U/L (ref 39–117)
Glucose, Bld: 104 mg/dL — ABNORMAL HIGH (ref 70–99)
Sodium: 142 mEq/L (ref 135–145)
Total Bilirubin: 0.3 mg/dL (ref 0.3–1.2)
Total Protein: 7 g/dL (ref 6.0–8.3)

## 2012-03-25 LAB — TSH: TSH: 2.33 u[IU]/mL (ref 0.35–5.50)

## 2012-03-25 LAB — HEPATIC FUNCTION PANEL
AST: 21 U/L (ref 0–37)
Albumin: 3.8 g/dL (ref 3.5–5.2)
Alkaline Phosphatase: 67 U/L (ref 39–117)

## 2012-03-25 NOTE — Progress Notes (Signed)
  Subjective:    Patient ID: Jeremy Stone, male    DOB: Apr 22, 1926, 78 y.o.   MRN: [id number]  HPI Jeremy Stone presents for a month h/o increased fatigue and lack of energy. He will awaken tired. He has no focal complaint or signs of any underlying problem. He has lost some weight but this has been intentional. He also reports that he has had balance problems. When he bends over to pick up anything he may fall and has fallen. He will have some balance with rapid change in position. This has been noticeably worse. He also reports that he has been dropping things more than ever.   Past Medical History  Diagnosis Date  . NIDDM (non-insulin dependent diabetes mellitus)   . Hyperlipidemia   . Thyroid nodule   . Hypertension   . Trigeminal neuralgia   . GERD (gastroesophageal reflux disease)   . OSA (obstructive sleep apnea)    Past Surgical History  Procedure Date  . Appendectomy   . Trigeminal surgery     07-22-07- [facility]   Family History  Problem Relation Age of Onset  . Dementia Sister    History   Social History  . Marital Status: Married    Spouse Name: N/A    Number of Children: N/A  . Years of Education: 16   Occupational History  . businessman    Social History Main Topics  . Smoking status: Former Smoker -- 1.0 packs/day for 20 years    Types: Cigarettes  . Smokeless tobacco: Never Used  . Alcohol Use: 5.0 oz/week    10 drink(s) per week  . Drug Use: No  . Sexually Active: Yes -- Male partner(s)   Other Topics Concern  . 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 Married  And divorced - 2 children: 1 son - Engineer, petroleum now in [place]; 1 dtr , several grandhchildren. 2nd marriage - doing well.       Review of Systems System review is negative for any constitutional, cardiac, pulmonary, GI or neuro symptoms or complaints other than as described in the HPI.     Objective:   Physical  Exam Filed Vitals:   03/25/12 1407  BP: 142/80  Pulse: 84  Temp: 97.1 F (36.2 C)  Resp: 16   Wt Readings from Last 3 Encounters:  03/25/12 212 lb (96.163 kg)  03/09/11 218 lb (98.884 kg)  12/30/10 217 lb (98.431 kg)  Gen'l- WNWD older white man in no distress HEENT - C&S clear Neck - supple, no JVD, no carotid bruits Cor 2+ radial pulse, 1+ DP -pulse. RRR II-III/VI systolic murmur best at LSB but also present RSB PUlm- normal respirations, CTAP Neuro- A&O x 3, CN II-XII normal with normal facial symmetry, no deviation or fasciculation of the tongue, normal shoulder shrug; MS 5/5 throughout; DTRs 2+ and equal; no tremor         Assessment & Plan:  Fatigue and malaise - new symptoms with a non-focal exam. Will r/o metabolic, hematologic, or cardiac etiology.  Plan Labs: CBC, metabolic, TSH, B12, ESR, A1C  Cardiac eval - 2 D echo to r/o LV dysfunction.

## 2012-03-25 NOTE — Patient Instructions (Signed)
Balance issue with standing suddenly is positional vertigo or carotid dysautonmia - the pressure sensor in the carotid artery has lost a little sensitivity.  Balance issue with bending over - strongly suggests an inner ear type problem. Your neurologic exam is normal. If this gets worse will consider brain imaging.  Fatigue and malaise - exam is non-focal. Plan - lab to look for anemia, B12 deficiency, thyroid issues, metabolic derangement. 2 D echo to look at the heart: juice per squeeze and to look for any valvular disease that could have affected the function of the heart overall.   At this time no new medications.  Older is not the same as Old, which is 33+

## 2012-03-26 NOTE — Assessment & Plan Note (Signed)
Lab Results  Component Value Date   HGBA1C 6.5 03/25/2012   Good control

## 2012-03-26 NOTE — Assessment & Plan Note (Signed)
BP Readings from Last 3 Encounters:  03/25/12 142/80  03/09/11 136/70  12/30/10 138/78   Adequate control on present medical regimen

## 2012-03-26 NOTE — Assessment & Plan Note (Signed)
Lab Results  Component Value Date   CHOL 106 03/25/2012   HDL 40.80 03/25/2012   LDLCALC 32 03/25/2012   TRIG 168.0* 03/25/2012   CHOLHDL 3 03/25/2012   Excellent control with normal LFTs

## 2012-03-26 NOTE — Assessment & Plan Note (Addendum)
Reports he is doing well with current CPAP equipment and settings.   For unexplained and persistent fatigue will consider a follow-up titration study

## 2012-03-29 ENCOUNTER — Other Ambulatory Visit (HOSPITAL_COMMUNITY): Payer: Medicare PPO

## 2012-03-31 ENCOUNTER — Encounter: Payer: Self-pay | Admitting: Internal Medicine

## 2012-04-03 ENCOUNTER — Ambulatory Visit (HOSPITAL_COMMUNITY): Payer: Medicare PPO | Attending: Cardiovascular Disease

## 2012-04-03 DIAGNOSIS — E785 Hyperlipidemia, unspecified: Secondary | ICD-10-CM | POA: Insufficient documentation

## 2012-04-03 DIAGNOSIS — I10 Essential (primary) hypertension: Secondary | ICD-10-CM | POA: Insufficient documentation

## 2012-04-03 DIAGNOSIS — I359 Nonrheumatic aortic valve disorder, unspecified: Secondary | ICD-10-CM | POA: Insufficient documentation

## 2012-04-03 DIAGNOSIS — R011 Cardiac murmur, unspecified: Secondary | ICD-10-CM | POA: Insufficient documentation

## 2012-04-03 DIAGNOSIS — R5381 Other malaise: Secondary | ICD-10-CM | POA: Insufficient documentation

## 2012-04-03 DIAGNOSIS — R5383 Other fatigue: Secondary | ICD-10-CM | POA: Insufficient documentation

## 2012-04-03 DIAGNOSIS — Z87891 Personal history of nicotine dependence: Secondary | ICD-10-CM | POA: Insufficient documentation

## 2012-04-03 DIAGNOSIS — G4730 Sleep apnea, unspecified: Secondary | ICD-10-CM | POA: Insufficient documentation

## 2012-04-03 DIAGNOSIS — E119 Type 2 diabetes mellitus without complications: Secondary | ICD-10-CM | POA: Insufficient documentation

## 2012-04-03 NOTE — Progress Notes (Signed)
Echocardiogram performed.  

## 2012-04-07 ENCOUNTER — Encounter: Payer: Self-pay | Admitting: Internal Medicine

## 2012-07-09 ENCOUNTER — Ambulatory Visit (INDEPENDENT_AMBULATORY_CARE_PROVIDER_SITE_OTHER): Payer: Medicare PPO

## 2012-07-09 DIAGNOSIS — Z23 Encounter for immunization: Secondary | ICD-10-CM

## 2012-08-26 ENCOUNTER — Other Ambulatory Visit: Payer: Self-pay | Admitting: Internal Medicine

## 2013-03-14 ENCOUNTER — Ambulatory Visit (INDEPENDENT_AMBULATORY_CARE_PROVIDER_SITE_OTHER): Payer: Medicare PPO | Admitting: Pulmonary Disease

## 2013-03-14 ENCOUNTER — Encounter: Payer: Self-pay | Admitting: Pulmonary Disease

## 2013-03-14 VITALS — BP 138/82 | HR 74 | Temp 97.6°F | Ht 70.0 in | Wt 214.0 lb

## 2013-03-14 DIAGNOSIS — G4733 Obstructive sleep apnea (adult) (pediatric): Secondary | ICD-10-CM

## 2013-03-14 NOTE — Progress Notes (Signed)
Subjective:    Patient ID: Jeremy Stone, male    DOB: Apr 09, 1926, 79 y.o.   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 Overnight polysomnogram will in 04/2006 showed severe obstructive sleep apnea  with AHI of 52 events per hour and lowest desaturation of 85%. This was corrected by CPAP of 14 cm with a large full face mask. Pt currently has CPAP. Pt wants to buy small CPAP. he is going to [place] next month and wants a portable machine for his visits outside the country. pt states he wears it everynight x 6-8 hrs a night. Denies any problems w/ mask/machine. Pressure seems fine. He wakes up with dry mouth that seems to be his only complaint. Enjoys a daily nap for 30 minutes that refreshes him in the afternoon. Bedtime is 11 PM, sleep latency is 10 minutes, he sleeps on his side with one pillow, he is 2 awakenings due to dryness of mouth and denies any post void sleep latency. He is out of bed at 7:30 AM feeling refreshed. Epworth sleepiness score is 7/24 He has recently changed insurance companies and is frustrated by the lack of cooperation.  Past Medical History  Diagnosis Date  . NIDDM (non-insulin dependent diabetes mellitus)   . Hyperlipidemia   . Thyroid nodule   . Hypertension   . Trigeminal neuralgia   . GERD (gastroesophageal reflux disease)   . OSA (obstructive sleep apnea)    Past Surgical History  Procedure Laterality Date  . Appendectomy    . Trigeminal surgery      07-22-07- [facility]   No Known Allergies  History   Social History  . Marital Status: Married    Spouse Name: N/A    Number of Children: N/A  . Years of Education: 16   Occupational History  . businessman    Social History Main Topics  . Smoking status: Former Smoker -- 1.00 packs/day for 20 years    Types: Cigarettes    Quit date: 10/23/1982  . Smokeless tobacco: Never Used  . Alcohol Use: 5.0 oz/week    10 drink(s) per week   Comment: wine with dinner  . Drug Use: No  . Sexually Active: Yes -- Male partner(s)   Other Topics Concern  . 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 Married  And divorced - 2 children: 1 son - Engineer, petroleum now in [place]; 1 dtr , several grandhchildren. 2nd marriage - doing well.      Review of Systems  Constitutional: Negative for appetite change and unexpected weight change.  HENT: Negative for ear pain, congestion, sore throat, sneezing, trouble swallowing and dental problem.   Respiratory: Positive for shortness of breath. Negative for cough.   Cardiovascular: Negative for chest pain, palpitations and leg swelling.  Gastrointestinal: Negative for abdominal pain.  Musculoskeletal: Negative for joint swelling.  Skin: Negative for pallor.  Neurological: Negative for headaches.  Psychiatric/Behavioral: Negative for dysphoric mood. The patient is not nervous/anxious.        Objective:   Physical Exam  Gen. Pleasant, well-nourished, in no distress, normal affect ENT - no lesions, no post nasal drip Neck: No JVD, no thyromegaly, no carotid bruits Lungs: no use of accessory muscles, no dullness to percussion, clear without rales or rhonchi  Cardiovascular: Rhythm regular, heart sounds  normal, no murmurs or gallops, no peripheral edema Abdomen: soft and non-tender, no hepatosplenomegaly, BS normal. Musculoskeletal: No  deformities, no cyanosis or clubbing Neuro:  alert, non focal       Assessment & Plan:

## 2013-03-14 NOTE — Patient Instructions (Signed)
Rx for CPAP 14 cm, humidity Trial of nasal mask - Rx will be sent to [facility] in few weeks to report if dryness better

## 2013-03-14 NOTE — Assessment & Plan Note (Addendum)
Rx for CPAP 14 cm, humidity - he will update portable machine himself Trial of nasal mask - Rx will be sent to [facility] in few weeks to report if dryness better  Weight loss encouraged, compliance with goal of at least 4-6 hrs every night is the expectation. Advised against medications with sedative side effects Cautioned against driving when sleepy - understanding that sleepiness will vary on a day to day basis

## 2013-03-18 ENCOUNTER — Telehealth: Payer: Self-pay | Admitting: Pulmonary Disease

## 2013-03-18 DIAGNOSIS — G4733 Obstructive sleep apnea (adult) (pediatric): Secondary | ICD-10-CM

## 2013-03-18 NOTE — Telephone Encounter (Signed)
Pt aware and orders sent to [facility].

## 2013-03-18 NOTE — Telephone Encounter (Signed)
OK to place order for  CPAP 14 cm, humidity , download in 4 wks

## 2013-03-18 NOTE — Telephone Encounter (Signed)
Spoke to pt. He states that his CPAP machine is making a terrible noise. Has had this current machine since 2009. Called [facility] this morning and was told that he is eligible for a new machine. Requesting that we place an order for a new machine.  Dr. Vassie Loll - please advise. Thanks.

## 2013-03-19 ENCOUNTER — Telehealth: Payer: Self-pay | Admitting: Pulmonary Disease

## 2013-03-19 NOTE — Telephone Encounter (Signed)
Pt has [employer] ins, which Jeremy Stone takes, referral that Was done 03/14/13 for mask of choice was sent to [facility], I called [facility] and spoke with Crystal at [facility], who says they called pt to set up, pt refused, did not want.  Spoke with Jeremy Stone, who says he is at [facility] right now and they are readjusting his cpap and getting new mask, he is changing insurance  1st of the year and wants to stay with [facility]. Nothing further is needed and he will discuss with Dr Vassie Loll per patient.

## 2013-03-21 ENCOUNTER — Telehealth: Payer: Self-pay | Admitting: Internal Medicine

## 2013-03-21 MED ORDER — AMLODIPINE BESYLATE 5 MG PO TABS: 5.0000 mg | ORAL_TABLET | Freq: Every day | ORAL | Status: DC

## 2013-03-21 MED ORDER — SIMVASTATIN 20 MG PO TABS: 20.0000 mg | ORAL_TABLET | Freq: Every day | ORAL | Status: DC

## 2013-03-21 MED ORDER — METFORMIN HCL 500 MG PO TABS: 500.0000 mg | ORAL_TABLET | Freq: Two times a day (BID) | ORAL | Status: DC

## 2013-03-21 NOTE — Telephone Encounter (Signed)
rx's sent to [id number] No refills patient is due for an office visit since last seen 03/25/2012

## 2013-03-21 NOTE — Telephone Encounter (Signed)
Pt req refill to be send [facility] for : metformin, amlodipine and simvastatin. Please advise

## 2013-03-24 ENCOUNTER — Telehealth: Payer: Self-pay | Admitting: Pulmonary Disease

## 2013-03-24 DIAGNOSIS — G4733 Obstructive sleep apnea (adult) (pediatric): Secondary | ICD-10-CM

## 2013-03-24 NOTE — Telephone Encounter (Signed)
Pl ask [facility] Buford Dresser) to change to auto 5-14 cm , download in 2 weeks They can call him to make changes ASAP

## 2013-03-24 NOTE — Telephone Encounter (Signed)
I spoke with pt. He reports he has only had 15 hrs of sleep in 10 days. He is waking up with severe dry mouth with his lips stuck together. He is waking up every hr. He is adjusting the humidifier and has done everything [facility] has reccommended. He reports he has spent over $1000 on a new CPAP and still having this problem. He machine is currently set at 14 cm according to last phone note on 03/18/13. Pt is requesting recs ASAP since he is not sleeping at all. Please advise Dr. Vassie Loll thanks

## 2013-03-25 NOTE — Telephone Encounter (Signed)
Order placed and pt is aware. [name], CMA  

## 2013-03-28 ENCOUNTER — Telehealth: Payer: Self-pay | Admitting: Pulmonary Disease

## 2013-03-28 NOTE — Telephone Encounter (Signed)
Error.  Wrong doc.  Holly D Pryor ° °

## 2013-03-28 NOTE — Telephone Encounter (Signed)
Combination of -keep CPAP on auto settings - increase humidifer setting to 4 -drink water

## 2013-03-28 NOTE — Telephone Encounter (Signed)
I spoke with pt spouse and she stated pt is not able to sleep nothing at all. Pt machine is on auto setting that same day we sent in the order 03/25/13. He states his biggest complaint is he is getting very dry. He is using Oasis for the dry mouth (moisturizing spray) right before he goes to bed. Pt lips will be stuck together when he wakes up, he will get up and drank water then repeat the cycle about 2-3 times a night. Pt is requesting recs ASAP. Please advise Dr. Vassie Loll

## 2013-03-28 NOTE — Telephone Encounter (Signed)
I spoke with pt and made him aware of AR recs. He voiced his understanding and needed nothing further.

## 2013-03-31 ENCOUNTER — Telehealth: Payer: Self-pay | Admitting: Pulmonary Disease

## 2013-03-31 NOTE — Telephone Encounter (Signed)
Dr. Vassie Loll do you want to work pt in this week. He is still having sleeping problems/CPAP. Pt stated he is going to [facility] today to get a chin strap to wear tonight to see if that helps. Pt stated he is not sleeping a wink. He wants to be seen in [place] office. thanks

## 2013-03-31 NOTE — Telephone Encounter (Signed)
Pt appt scheduled. Nothing further was needed

## 2013-03-31 NOTE — Telephone Encounter (Signed)
ok 

## 2013-04-02 ENCOUNTER — Ambulatory Visit (INDEPENDENT_AMBULATORY_CARE_PROVIDER_SITE_OTHER): Payer: Medicare PPO | Admitting: Pulmonary Disease

## 2013-04-02 ENCOUNTER — Encounter: Payer: Self-pay | Admitting: Pulmonary Disease

## 2013-04-02 VITALS — BP 130/76 | HR 80 | Temp 96.7°F | Ht 70.0 in | Wt 216.0 lb

## 2013-04-02 DIAGNOSIS — G4733 Obstructive sleep apnea (adult) (pediatric): Secondary | ICD-10-CM

## 2013-04-02 NOTE — Assessment & Plan Note (Signed)
Stay on chin strap x 2 weeks , then wean off as we discussed Stay on zyrtec x June & nasocort  We will call you with download report  Weight loss encouraged, compliance with goal of at least 4-6 hrs every night is the expectation. Advised against medications with sedative side effects Cautioned against driving when sleepy - understanding that sleepiness will vary on a day to day basis

## 2013-04-02 NOTE — Progress Notes (Signed)
  Subjective:    Patient ID: Jeremy Stone, male    DOB: Dec 14, 1925, 78 y.o.   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  Overnight polysomnogram will in 04/2006 showed severe obstructive sleep apnea with AHI of 52 events per hour and lowest desaturation of 85%. This was corrected by CPAP of 14 cm with a large full face mask.  Pt currently has CPAP. Pt wants to buy small CPAP. he is going to [place] next month and wants a portable machine for his visits outside the country. pt states he wears it everynight x 6-8 hrs a night. Denies any problems w/ mask/machine. Pressure seems fine. He wakes up with dry mouth that seems to be his only complaint.  Enjoys a daily nap for 30 minutes that refreshes him in the afternoon.  Bedtime is 11 PM, sleep latency is 10 minutes, he sleeps on his side with one pillow, he is 2 awakenings due to dryness of mouth and denies any post void sleep latency. He is out of bed at 7:30 AM feeling refreshed. Epworth sleepiness score is 7/24  He has recently changed insurance companies and is frustrated by the lack of cooperation from [facility], prefers [facility].  04/02/2013 He got new CPAP from [facility] (paid out of pocket) C/o Extreme dryness >>unclear why worse now - he has been on cpap x yrs Advised Combination of  -keep CPAP on auto settings  - increase humidifer setting to 4  -drink water Finally obtained relief with Chin strap , zyrtec & nasocort   Review of Systems neg for any significant sore throat, dysphagia, itching, sneezing, nasal congestion or excess/ purulent secretions, fever, chills, sweats, unintended wt loss, pleuritic or exertional cp, hempoptysis, orthopnea pnd or change in chronic leg swelling. Also denies presyncope, palpitations, heartburn, abdominal pain, nausea, vomiting, diarrhea or change in bowel or urinary habits, dysuria,hematuria, rash, arthralgias, visual complaints, headache, numbness weakness or  ataxia.     Objective:   Physical Exam  Gen. Pleasant, well-nourished, in no distress ENT - no lesions, no post nasal drip Neck: No JVD, no thyromegaly, no carotid bruits Lungs: no use of accessory muscles, no dullness to percussion, clear without rales or rhonchi  Cardiovascular: Rhythm regular, heart sounds  normal, no murmurs or gallops, no peripheral edema Musculoskeletal: No deformities, no cyanosis or clubbing        Assessment & Plan:

## 2013-04-02 NOTE — Patient Instructions (Signed)
Stay on chin strap x 2 weeks , then wean off as we discussed Stay on zyrtec x June & nasocort  We will call you with download report

## 2013-04-21 ENCOUNTER — Ambulatory Visit (INDEPENDENT_AMBULATORY_CARE_PROVIDER_SITE_OTHER): Payer: Medicare PPO | Admitting: Internal Medicine

## 2013-04-21 ENCOUNTER — Encounter: Payer: Self-pay | Admitting: Internal Medicine

## 2013-04-21 VITALS — BP 150/78 | HR 79 | Temp 97.2°F | Ht 67.75 in | Wt 211.0 lb

## 2013-04-21 DIAGNOSIS — E119 Type 2 diabetes mellitus without complications: Secondary | ICD-10-CM

## 2013-04-21 DIAGNOSIS — I10 Essential (primary) hypertension: Secondary | ICD-10-CM

## 2013-04-21 DIAGNOSIS — M25559 Pain in unspecified hip: Secondary | ICD-10-CM

## 2013-04-21 DIAGNOSIS — G8929 Other chronic pain: Secondary | ICD-10-CM

## 2013-04-21 DIAGNOSIS — R5381 Other malaise: Secondary | ICD-10-CM

## 2013-04-21 DIAGNOSIS — E785 Hyperlipidemia, unspecified: Secondary | ICD-10-CM

## 2013-04-21 MED ORDER — ZOLPIDEM TARTRATE 5 MG PO TABS: 5.0000 mg | ORAL_TABLET | Freq: Every evening | ORAL | Status: DC | PRN

## 2013-04-21 NOTE — Progress Notes (Signed)
Subjective:    Patient ID: Jeremy Stone, male    DOB: 09/01/1926, 79 y.o.   MRN: [id number]  HPI Mr. Hoshino presents for increased fatigue and low energy. He has been taking B12 tablets/otc but still feels like he is fatigued.  He has been having trouble with CPAP: dry mouth. But after seeing Dr. Vassie Loll and the Home health respiratory clinician he was fitted with a chin strap which eliminated the dry mouth problem and has allowed for better sleep. He is still having some somnolence.   He has a problem with left hip pain at the left buttock that is relieved by deep massage and does not radiate to the leg. Sounds more like piriformis/gluteus problem. No hip films on file.   Past Medical History  Diagnosis Date  . NIDDM (non-insulin dependent diabetes mellitus)   . Hyperlipidemia   . Thyroid nodule   . Hypertension   . Trigeminal neuralgia   . GERD (gastroesophageal reflux disease)   . OSA (obstructive sleep apnea)    Past Surgical History  Procedure Laterality Date  . Appendectomy    . Trigeminal surgery      07-22-07- [facility]   Family History  Problem Relation Age of Onset  . Dementia Sister    History   Social History  . Marital Status: Married    Spouse Name: N/A    Number of Children: N/A  . Years of Education: 16   Occupational History  . businessman    Social History Main Topics  . Smoking status: Former Smoker -- 1.00 packs/day for 20 years    Types: Cigarettes    Quit date: 10/23/1982  . Smokeless tobacco: Never Used  . Alcohol Use: 5.0 oz/week    10 drink(s) per week     Comment: wine with dinner  . Drug Use: No  . Sexually Active: Yes -- Male partner(s)   Other Topics Concern  . 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 2nd marriage - doing well.    Current Outpatient  Prescriptions on File Prior to Visit  Medication Sig Dispense Refill  . amLODipine (NORVASC) 5 MG tablet Take 1 tablet (5 mg total) by mouth daily.  90 tablet  0  . aspirin 325 MG tablet Take 325 mg by mouth daily.        . cetirizine (ZYRTEC) 10 MG tablet Take 10 mg by mouth daily.      . cholecalciferol (VITAMIN D) 1000 UNITS tablet Take 1,000 Units by mouth daily.      . Coenzyme Q10 (CO Q-10) 100 MG CAPS Take by mouth daily.      . Cyanocobalamin (VITAMIN B 12 PO) Take by mouth daily.      . meclizine (ANTIVERT) 25 MG tablet 1/2 or 1 tab q 6 hrs until symptoms clear then taper off       . metFORMIN (GLUCOPHAGE) 500 MG tablet Take 1 tablet (500 mg total) by mouth 2 (two) times daily with a meal.  180 tablet  0  . simvastatin (ZOCOR) 20 MG tablet Take 1 tablet (20 mg total) by mouth at bedtime.  90 tablet  0  . triamcinolone (NASACORT) 55 MCG/ACT nasal inhaler Place 2 sprays into the nose daily.       No current facility-administered medications on file prior to visit.  Review of Systems System review is negative for any constitutional, cardiac, pulmonary, GI or neuro symptoms or complaints other than as described in the HPI.     Objective:   Physical Exam Filed Vitals:   04/21/13 1651  BP: 150/78  Pulse: 79  Temp: 97.2 F (36.2 C)   gen'l- overweight white man who looks very good for his age HEENT- C&S clear, no facial lesions Cor 2+ radial, RRR Pulm - normal respirations Neuro - A&O x 3, normal gait and station.       Assessment & Plan:  1. Generalized weakness - may be sleep issues, but CPAP is working better. There may be metabolic derangement, i.e. B12 deficiency, glucose, renal or liver function issues.   Plan Full lab workup to include B12  2. Buttock pain - what you describe is more consistent with piriformis syndrome with some involvement of the gluteus muscle group. This is usually associated with hip problems. Your symptoms do not suggest a disk issue in  the back.

## 2013-04-21 NOTE — Patient Instructions (Addendum)
Generalized weakness - may be sleep issues, but CPAP is working better. There may be metabolic derangement, i.e. B12 deficiency, glucose, renal or liver function issues.   Plan Full lab workup to include B12  Buttock pain - what you describe is more consistent with piriformis syndrome with some involvement of the gluteus muscle group. This is usually associated with hip problems. Your symptoms do not suggest a disk issue in the back.  Head treatment - Dr. Jorja Loa is great and has a good sense of humor  Please sign up for MyChart

## 2013-04-22 ENCOUNTER — Other Ambulatory Visit (INDEPENDENT_AMBULATORY_CARE_PROVIDER_SITE_OTHER): Payer: Medicare PPO

## 2013-04-22 ENCOUNTER — Ambulatory Visit (INDEPENDENT_AMBULATORY_CARE_PROVIDER_SITE_OTHER)
Admission: RE | Admit: 2013-04-22 | Discharge: 2013-04-22 | Disposition: A | Discharge status: Home / Self Care | Payer: Medicare PPO | Source: Ambulatory Visit | Attending: Internal Medicine | Admitting: Internal Medicine

## 2013-04-22 DIAGNOSIS — G8929 Other chronic pain: Secondary | ICD-10-CM

## 2013-04-22 DIAGNOSIS — E785 Hyperlipidemia, unspecified: Secondary | ICD-10-CM

## 2013-04-22 DIAGNOSIS — I10 Essential (primary) hypertension: Secondary | ICD-10-CM

## 2013-04-22 DIAGNOSIS — R5383 Other fatigue: Secondary | ICD-10-CM

## 2013-04-22 DIAGNOSIS — M25552 Pain in left hip: Secondary | ICD-10-CM

## 2013-04-22 DIAGNOSIS — E119 Type 2 diabetes mellitus without complications: Secondary | ICD-10-CM

## 2013-04-22 DIAGNOSIS — R5381 Other malaise: Secondary | ICD-10-CM

## 2013-04-22 DIAGNOSIS — M25559 Pain in unspecified hip: Secondary | ICD-10-CM

## 2013-04-22 LAB — HEPATIC FUNCTION PANEL
AST: 17 U/L (ref 0–37)
Albumin: 4.1 g/dL (ref 3.5–5.2)
Alkaline Phosphatase: 68 U/L (ref 39–117)
Total Protein: 7.5 g/dL (ref 6.0–8.3)

## 2013-04-22 LAB — CBC WITH DIFFERENTIAL/PLATELET
Basophils Absolute: 0 10*3/uL (ref 0.0–0.1)
Eosinophils Absolute: 0.3 10*3/uL (ref 0.0–0.7)
HCT: 42.9 % (ref 39.0–52.0)
Lymphs Abs: 3 10*3/uL (ref 0.7–4.0)
MCHC: 33.8 g/dL (ref 30.0–36.0)
Monocytes Absolute: 0.8 10*3/uL (ref 0.1–1.0)
Monocytes Relative: 8.1 % (ref 3.0–12.0)
Platelets: 276 10*3/uL (ref 150.0–400.0)
RDW: 13.7 % (ref 11.5–14.6)

## 2013-04-22 LAB — COMPREHENSIVE METABOLIC PANEL
AST: 17 U/L (ref 0–37)
Alkaline Phosphatase: 68 U/L (ref 39–117)
BUN: 21 mg/dL (ref 6–23)
Calcium: 9.4 mg/dL (ref 8.4–10.5)
Chloride: 106 mEq/L (ref 96–112)
Creatinine, Ser: 1.2 mg/dL (ref 0.4–1.5)
Glucose, Bld: 122 mg/dL — ABNORMAL HIGH (ref 70–99)

## 2013-04-22 LAB — LIPID PANEL
Cholesterol: 123 mg/dL (ref 0–200)
HDL: 44.9 mg/dL (ref >39.00)
Total CHOL/HDL Ratio: 3
Triglycerides: 56 mg/dL (ref 0.0–149.0)

## 2013-04-22 LAB — VITAMIN B12: Vitamin B-12: 627 pg/mL (ref 211–911)

## 2013-04-24 ENCOUNTER — Telehealth: Payer: Self-pay | Admitting: *Deleted

## 2013-04-24 ENCOUNTER — Telehealth: Payer: Self-pay | Admitting: Internal Medicine

## 2013-04-24 ENCOUNTER — Encounter: Payer: Self-pay | Admitting: Family Medicine

## 2013-04-24 ENCOUNTER — Encounter: Payer: Self-pay | Admitting: Internal Medicine

## 2013-04-24 ENCOUNTER — Ambulatory Visit (INDEPENDENT_AMBULATORY_CARE_PROVIDER_SITE_OTHER): Payer: Medicare PPO | Admitting: Family Medicine

## 2013-04-24 VITALS — BP 150/70 | HR 94 | Temp 98.6°F | Wt 210.0 lb

## 2013-04-24 DIAGNOSIS — J069 Acute upper respiratory infection, unspecified: Secondary | ICD-10-CM

## 2013-04-24 MED ORDER — AMOXICILLIN 875 MG PO TABS: 875.0000 mg | ORAL_TABLET | Freq: Two times a day (BID) | ORAL | Status: DC

## 2013-04-24 NOTE — Progress Notes (Signed)
Chief Complaint  Patient presents with  . Cough    chills x3 days, fatigue    HPI:  Acute visit for cough: -started 3 days ago -symptoms: cough, drainage, chills, nasal congestion, fatigue -denies: fevers, chills, NVD, SOB, ear pain or tooth pain -has not tried anything for this  ROS: See pertinent positives and negatives per HPI.  Past Medical History  Diagnosis Date  . NIDDM (non-insulin dependent diabetes mellitus)   . Hyperlipidemia   . Thyroid nodule   . Hypertension   . Trigeminal neuralgia   . GERD (gastroesophageal reflux disease)   . OSA (obstructive sleep apnea)     Family History  Problem Relation Age of Onset  . Dementia Sister     History   Social History  . Marital Status: Married    Spouse Name: N/A    Number of Children: N/A  . Years of Education: 16   Occupational History  . businessman    Social History Main Topics  . Smoking status: Former Smoker -- 1.00 packs/day for 20 years    Types: Cigarettes    Quit date: 10/23/1982  . Smokeless tobacco: Never Used  . Alcohol Use: 5.0 oz/week    10 drink(s) per week     Comment: wine with dinner  . Drug Use: No  . Sexually Active: Yes -- Male partner(s)   Other Topics Concern  . 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 2nd marriage - doing well.    Current outpatient prescriptions:amLODipine (NORVASC) 5 MG tablet, Take 1 tablet (5 mg total) by mouth daily., Disp: 90 tablet, Rfl: 0;  aspirin 325 MG tablet, Take 325 mg by mouth daily.  , Disp: , Rfl: ;  cetirizine (ZYRTEC) 10 MG tablet, Take 10 mg by mouth daily., Disp: , Rfl: ;  cholecalciferol (VITAMIN D) 1000 UNITS tablet, Take 1,000 Units by mouth daily., Disp: , Rfl: ;  Coenzyme Q10 (CO Q-10) 100 MG CAPS, Take by mouth daily., Disp: , Rfl:  Cyanocobalamin (VITAMIN B 12 PO), Take by  mouth daily., Disp: , Rfl: ;  meclizine (ANTIVERT) 25 MG tablet, 1/2 or 1 tab q 6 hrs until symptoms clear then taper off , Disp: , Rfl: ;  metFORMIN (GLUCOPHAGE) 500 MG tablet, Take 1 tablet (500 mg total) by mouth 2 (two) times daily with a meal., Disp: 180 tablet, Rfl: 0;  simvastatin (ZOCOR) 20 MG tablet, Take 1 tablet (20 mg total) by mouth at bedtime., Disp: 90 tablet, Rfl: 0 triamcinolone (NASACORT) 55 MCG/ACT nasal inhaler, Place 2 sprays into the nose daily., Disp: , Rfl: ;  zolpidem (AMBIEN) 5 MG tablet, Take 1 tablet (5 mg total) by mouth at bedtime as needed for sleep., Disp: 15 tablet, Rfl: 1;  amoxicillin (AMOXIL) 875 MG tablet, Take 1 tablet (875 mg total) by mouth 2 (two) times daily., Disp: 20 tablet, Rfl: 0  EXAM:  Filed Vitals:   04/24/13 1416  BP: 150/70  Pulse: 94  Temp: 98.6 F (37 C)    Body mass index is 32.16 kg/(m^2).  GENERAL: vitals reviewed and listed above, alert, oriented, appears well hydrated and in no acute distress  HEENT: atraumatic, conjunttiva clear, no obvious abnormalities on inspection of external nose and ears, normal appearance of ear canals and TMs, clear nasal congestion, mild post oropharyngeal erythema with PND, no tonsillar  edema or exudate, no sinus TTP  NECK: no obvious masses on inspection  LUNGS: clear to auscultation bilaterally, no wheezes, rales or rhonchi, good air movement  CV: HRRR, SEM - (documented by PCP in 2013) no peripheral edema  MS: moves all extremities without noticeable abnormality  PSYCH: pleasant and cooperative, no obvious depression or anxiety  ASSESSMENT AND PLAN:  Discussed the following assessment and plan:  Upper respiratory infection - Plan: amoxicillin (AMOXIL) 875 MG tablet  -advised to follow PCP recs for cough, supportive care and abx printed if sinus pain, fevers as wife is very worried has bacterial infection - explained this is more likely viral and risks of abx -return and emergent  precautions -Patient advised to return or notify a doctor immediately if symptoms worsen or persist or new concerns arise.  There are no Patient Instructions on file for this visit.   Kriste Basque R.

## 2013-04-24 NOTE — Telephone Encounter (Signed)
Called - left msg. Radiologist could not exclude blastic changes but there is no evidence of prostate disease. The DJD lumbar region is the more likely cause of pain.  Patient advised that if he is concerned he can come in for a prostate exam.

## 2013-04-24 NOTE — Telephone Encounter (Signed)
1st choice - robitussin DM 1 tsp every 4 hours. If this does not work - Rx for phenergan with codeine 1 tsp every 6  - will need to be called in if needed.

## 2013-04-24 NOTE — Telephone Encounter (Signed)
Pt called states he has developed a cough since his OV on Monday.  Pt is requesting a medication to treat the cough either prescribed or over the counter.  Please advise pt.

## 2013-04-24 NOTE — Telephone Encounter (Signed)
Spoke with pt advised him of MDs message 

## 2013-04-28 ENCOUNTER — Inpatient Hospital Stay (HOSPITAL_COMMUNITY)
Admission: AD | Admit: 2013-04-28 | Discharge: 2013-05-02 | DRG: 193 | Disposition: A | Discharge status: Home / Self Care | Payer: Medicare PPO | Source: Ambulatory Visit | Attending: Internal Medicine | Admitting: Internal Medicine

## 2013-04-28 ENCOUNTER — Ambulatory Visit (HOSPITAL_COMMUNITY): Payer: Medicare PPO

## 2013-04-28 ENCOUNTER — Inpatient Hospital Stay (HOSPITAL_COMMUNITY): Payer: Medicare PPO

## 2013-04-28 ENCOUNTER — Ambulatory Visit (INDEPENDENT_AMBULATORY_CARE_PROVIDER_SITE_OTHER): Payer: Medicare PPO | Admitting: Internal Medicine

## 2013-04-28 ENCOUNTER — Telehealth: Payer: Self-pay | Admitting: Internal Medicine

## 2013-04-28 ENCOUNTER — Encounter (HOSPITAL_COMMUNITY): Payer: Self-pay

## 2013-04-28 ENCOUNTER — Encounter: Payer: Self-pay | Admitting: Internal Medicine

## 2013-04-28 VITALS — BP 150/90 | HR 127 | Temp 102.8°F | Wt 206.0 lb

## 2013-04-28 DIAGNOSIS — R509 Fever, unspecified: Secondary | ICD-10-CM

## 2013-04-28 DIAGNOSIS — K219 Gastro-esophageal reflux disease without esophagitis: Secondary | ICD-10-CM | POA: Diagnosis present

## 2013-04-28 DIAGNOSIS — E119 Type 2 diabetes mellitus without complications: Secondary | ICD-10-CM

## 2013-04-28 DIAGNOSIS — I359 Nonrheumatic aortic valve disorder, unspecified: Secondary | ICD-10-CM | POA: Diagnosis present

## 2013-04-28 DIAGNOSIS — J189 Pneumonia, unspecified organism: Principal | ICD-10-CM | POA: Diagnosis present

## 2013-04-28 DIAGNOSIS — G500 Trigeminal neuralgia: Secondary | ICD-10-CM | POA: Diagnosis present

## 2013-04-28 DIAGNOSIS — Z6832 Body mass index (BMI) 32.0-32.9, adult: Secondary | ICD-10-CM

## 2013-04-28 DIAGNOSIS — E43 Unspecified severe protein-calorie malnutrition: Secondary | ICD-10-CM

## 2013-04-28 DIAGNOSIS — K449 Diaphragmatic hernia without obstruction or gangrene: Secondary | ICD-10-CM | POA: Diagnosis present

## 2013-04-28 DIAGNOSIS — R0902 Hypoxemia: Secondary | ICD-10-CM | POA: Diagnosis present

## 2013-04-28 DIAGNOSIS — Z791 Long term (current) use of non-steroidal anti-inflammatories (NSAID): Secondary | ICD-10-CM

## 2013-04-28 DIAGNOSIS — E669 Obesity, unspecified: Secondary | ICD-10-CM | POA: Diagnosis present

## 2013-04-28 DIAGNOSIS — G4733 Obstructive sleep apnea (adult) (pediatric): Secondary | ICD-10-CM

## 2013-04-28 DIAGNOSIS — E785 Hyperlipidemia, unspecified: Secondary | ICD-10-CM

## 2013-04-28 DIAGNOSIS — K5730 Diverticulosis of large intestine without perforation or abscess without bleeding: Secondary | ICD-10-CM | POA: Diagnosis present

## 2013-04-28 DIAGNOSIS — Z79899 Other long term (current) drug therapy: Secondary | ICD-10-CM

## 2013-04-28 DIAGNOSIS — Z87891 Personal history of nicotine dependence: Secondary | ICD-10-CM

## 2013-04-28 DIAGNOSIS — I2510 Atherosclerotic heart disease of native coronary artery without angina pectoris: Secondary | ICD-10-CM | POA: Diagnosis present

## 2013-04-28 DIAGNOSIS — I10 Essential (primary) hypertension: Secondary | ICD-10-CM

## 2013-04-28 DIAGNOSIS — F05 Delirium due to known physiological condition: Secondary | ICD-10-CM

## 2013-04-28 DIAGNOSIS — Z7982 Long term (current) use of aspirin: Secondary | ICD-10-CM

## 2013-04-28 DIAGNOSIS — K4090 Unilateral inguinal hernia, without obstruction or gangrene, not specified as recurrent: Secondary | ICD-10-CM | POA: Diagnosis present

## 2013-04-28 DIAGNOSIS — J438 Other emphysema: Secondary | ICD-10-CM | POA: Diagnosis present

## 2013-04-28 DIAGNOSIS — J159 Unspecified bacterial pneumonia: Secondary | ICD-10-CM

## 2013-04-28 LAB — HEMOGLOBIN A1C: Hgb A1c MFr Bld: 6.3 % — ABNORMAL HIGH (ref <5.7)

## 2013-04-28 LAB — GLUCOSE, CAPILLARY
Glucose-Capillary: 122 mg/dL — ABNORMAL HIGH (ref 70–99)
Glucose-Capillary: 140 mg/dL — ABNORMAL HIGH (ref 70–99)
Glucose-Capillary: 94 mg/dL (ref 70–99)
Glucose-Capillary: 99 mg/dL (ref 70–99)

## 2013-04-28 LAB — CBC WITH DIFFERENTIAL/PLATELET
Basophils Absolute: 0 10*3/uL (ref 0.0–0.1)
Eosinophils Absolute: 0 10*3/uL (ref 0.0–0.7)
Eosinophils Relative: 0 % (ref 0–5)
Lymphocytes Relative: 10 % — ABNORMAL LOW (ref 12–46)
Lymphs Abs: 1 10*3/uL (ref 0.7–4.0)
MCV: 93.3 fL (ref 78.0–100.0)
Neutrophils Relative %: 80 % — ABNORMAL HIGH (ref 43–77)
Platelets: 223 10*3/uL (ref 150–400)
RBC: 4.16 MIL/uL — ABNORMAL LOW (ref 4.22–5.81)
RDW: 13.4 % (ref 11.5–15.5)
WBC: 10.2 10*3/uL (ref 4.0–10.5)

## 2013-04-28 LAB — COMPREHENSIVE METABOLIC PANEL
Alkaline Phosphatase: 80 U/L (ref 39–117)
BUN: 17 mg/dL (ref 6–23)
CO2: 25 mEq/L (ref 19–32)
Chloride: 99 mEq/L (ref 96–112)
Creatinine, Ser: 1.19 mg/dL (ref 0.50–1.35)
GFR calc non Af Amer: 54 mL/min — ABNORMAL LOW (ref >90)
Glucose, Bld: 149 mg/dL — ABNORMAL HIGH (ref 70–99)
Potassium: 3.9 mEq/L (ref 3.5–5.1)
Total Bilirubin: 0.6 mg/dL (ref 0.3–1.2)

## 2013-04-28 MED ORDER — PANTOPRAZOLE SODIUM 40 MG PO TBEC
40.0000 mg | DELAYED_RELEASE_TABLET | Freq: Every day | ORAL | Status: DC
  Administered 2013-04-28 – 2013-05-02 (×4): 40 mg via ORAL
  Filled 2013-04-28 (×6): qty 1

## 2013-04-28 MED ORDER — IOHEXOL 300 MG/ML  SOLN
100.0000 mL | Freq: Once | INTRAMUSCULAR | Status: AC | PRN
  Administered 2013-04-28: 100 mL via INTRAVENOUS

## 2013-04-28 MED ORDER — ZOLPIDEM TARTRATE 5 MG PO TABS: 5.0000 mg | ORAL_TABLET | Freq: Every evening | ORAL | Status: DC | PRN

## 2013-04-28 MED ORDER — SENNA 8.6 MG PO TABS
1.0000 | ORAL_TABLET | Freq: Two times a day (BID) | ORAL | Status: DC
  Administered 2013-04-28 – 2013-05-02 (×8): 8.6 mg via ORAL
  Filled 2013-04-28 (×9): qty 1

## 2013-04-28 MED ORDER — FLUTICASONE PROPIONATE 50 MCG/ACT NA SUSP
1.0000 | Freq: Every day | NASAL | Status: DC
  Administered 2013-04-28 – 2013-04-30 (×3): 1 via NASAL
  Filled 2013-04-28: qty 16

## 2013-04-28 MED ORDER — ACETAMINOPHEN 650 MG RE SUPP: 650.0000 mg | Freq: Four times a day (QID) | RECTAL | Status: DC | PRN

## 2013-04-28 MED ORDER — ONDANSETRON HCL 4 MG PO TABS: 4.0000 mg | ORAL_TABLET | Freq: Four times a day (QID) | ORAL | Status: DC | PRN

## 2013-04-28 MED ORDER — ENOXAPARIN SODIUM 40 MG/0.4ML ~~LOC~~ SOLN
40.0000 mg | Freq: Every day | SUBCUTANEOUS | Status: DC
  Administered 2013-04-28 – 2013-04-30 (×3): 40 mg via SUBCUTANEOUS
  Filled 2013-04-28 (×5): qty 0.4

## 2013-04-28 MED ORDER — ACETAMINOPHEN 325 MG PO TABS
650.0000 mg | ORAL_TABLET | Freq: Four times a day (QID) | ORAL | Status: DC | PRN
  Administered 2013-04-28 – 2013-05-01 (×9): 650 mg via ORAL
  Filled 2013-04-28 (×9): qty 2

## 2013-04-28 MED ORDER — ONDANSETRON HCL 4 MG/2ML IJ SOLN: 4.0000 mg | Freq: Four times a day (QID) | INTRAMUSCULAR | Status: DC | PRN

## 2013-04-28 MED ORDER — LORATADINE 10 MG PO TABS
10.0000 mg | ORAL_TABLET | Freq: Every day | ORAL | Status: DC
  Administered 2013-04-28 – 2013-05-02 (×5): 10 mg via ORAL
  Filled 2013-04-28 (×5): qty 1

## 2013-04-28 MED ORDER — PIPERACILLIN-TAZOBACTAM 3.375 G IVPB
3.3750 g | Freq: Three times a day (TID) | INTRAVENOUS | Status: DC
  Administered 2013-04-28 – 2013-05-02 (×11): 3.375 g via INTRAVENOUS
  Filled 2013-04-28 (×13): qty 50

## 2013-04-28 MED ORDER — SACCHAROMYCES BOULARDII 250 MG PO CAPS
250.0000 mg | ORAL_CAPSULE | Freq: Two times a day (BID) | ORAL | Status: DC
  Administered 2013-04-28 – 2013-05-01 (×7): 250 mg via ORAL
  Filled 2013-04-28 (×10): qty 1

## 2013-04-28 MED ORDER — SIMVASTATIN 20 MG PO TABS
20.0000 mg | ORAL_TABLET | Freq: Every day | ORAL | Status: DC
  Administered 2013-04-28 – 2013-05-01 (×4): 20 mg via ORAL
  Filled 2013-04-28 (×5): qty 1

## 2013-04-28 MED ORDER — DM-GUAIFENESIN ER 30-600 MG PO TB12
1.0000 | ORAL_TABLET | Freq: Two times a day (BID) | ORAL | Status: DC
  Administered 2013-04-28 – 2013-05-01 (×7): 1 via ORAL
  Filled 2013-04-28 (×11): qty 1

## 2013-04-28 MED ORDER — TRAMADOL HCL 50 MG PO TABS
50.0000 mg | ORAL_TABLET | Freq: Four times a day (QID) | ORAL | Status: DC
Start: 2013-04-28 — End: 2013-05-02
  Administered 2013-04-28: 50 mg via ORAL
  Filled 2013-04-28 (×19): qty 1

## 2013-04-28 MED ORDER — IOHEXOL 300 MG/ML  SOLN
25.0000 mL | INTRAMUSCULAR | Status: AC
  Administered 2013-04-28 (×2): 25 mL via ORAL

## 2013-04-28 MED ORDER — ALBUTEROL SULFATE (5 MG/ML) 0.5% IN NEBU
2.5000 mg | INHALATION_SOLUTION | Freq: Four times a day (QID) | RESPIRATORY_TRACT | Status: DC
  Administered 2013-04-28 – 2013-05-02 (×12): 2.5 mg via RESPIRATORY_TRACT
  Filled 2013-04-28 (×13): qty 0.5

## 2013-04-28 MED ORDER — ASPIRIN 325 MG PO TABS
325.0000 mg | ORAL_TABLET | Freq: Every day | ORAL | Status: DC
  Administered 2013-04-28 – 2013-05-02 (×4): 325 mg via ORAL
  Filled 2013-04-28 (×5): qty 1

## 2013-04-28 MED ORDER — AMLODIPINE BESYLATE 5 MG PO TABS
5.0000 mg | ORAL_TABLET | Freq: Every day | ORAL | Status: DC
  Administered 2013-04-29 – 2013-05-02 (×3): 5 mg via ORAL
  Filled 2013-04-28 (×4): qty 1

## 2013-04-28 MED ORDER — INSULIN ASPART 100 UNIT/ML ~~LOC~~ SOLN
0.0000 [IU] | SUBCUTANEOUS | Status: DC
  Administered 2013-04-28 – 2013-04-29 (×3): 2 [IU] via SUBCUTANEOUS

## 2013-04-28 MED ORDER — SODIUM CHLORIDE 0.45 % IV SOLN
50.0000 mL/h | INTRAVENOUS | Status: DC
  Administered 2013-04-28 – 2013-05-02 (×3): 50 mL/h via INTRAVENOUS

## 2013-04-28 NOTE — H&P (Signed)
Jeremy Stone is an 79 y.o. male.   Chief Complaint: Fever to 105, productive cough HPI: [name] was seen at the [facility] office recently and diagnosed with URI. He was started on Amoxicillin. For the last 4-5 days he has been having intermittent fevers to 103-105 degrees fahrenheit. Home remedies have brought the fever down. He has also had a harsh productive cough along with increased SOB. He has not had hemoptysis. On exam he was also found to have tenderness in the RLQ (s/p appy many years ago).  He is now admitted for evaluation of fever, cough and abdominal pain in an elderly patient. Past Medical History  Diagnosis Date  . NIDDM (non-insulin dependent diabetes mellitus)   . Hyperlipidemia   . Thyroid nodule   . Hypertension   . Trigeminal neuralgia   . GERD (gastroesophageal reflux disease)   . OSA (obstructive sleep apnea)     Past Surgical History  Procedure Laterality Date  . Appendectomy    . Trigeminal surgery      07-22-07- [facility]    Family History  Problem Relation Age of Onset  . Dementia Sister    Social History:  reports that he quit smoking about 30 years ago. His smoking use included Cigarettes. He has a 20 pack-year smoking history. He has never used smokeless tobacco. He reports that he drinks about 5.0 ounces of alcohol per week. He reports that he does not use illicit drugs.  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 Married And divorced - 2 children: 1 son - Engineer, petroleum now in [place]; 1 dtr , several grandhchildren. 2nd marriage - doing well.    Allergies: No Known Allergies  No current facility-administered medications on file prior to encounter.   Current Outpatient Prescriptions on File Prior to Encounter  Medication Sig Dispense Refill  . amLODipine (NORVASC) 5 MG tablet Take 1 tablet (5 mg total) by mouth daily.  90 tablet  0  . aspirin 325 MG tablet Take 325 mg by mouth daily.        . cetirizine (ZYRTEC) 10 MG  tablet Take 10 mg by mouth daily.      . cholecalciferol (VITAMIN D) 1000 UNITS tablet Take 1,000 Units by mouth daily.      . Coenzyme Q10 (CO Q-10) 100 MG CAPS Take by mouth daily.      . Cyanocobalamin (VITAMIN B 12 PO) Take by mouth daily.      Marland Kitchen dextromethorphan-guaiFENesin (MUCINEX DM) 30-600 MG per 12 hr tablet Take 1 tablet by mouth every 12 (twelve) hours.      Marland Kitchen ibuprofen (ADVIL,MOTRIN) 200 MG tablet Take 200 mg by mouth every 6 (six) hours as needed for pain.      . meclizine (ANTIVERT) 25 MG tablet 1/2 or 1 tab q 6 hrs until symptoms clear then taper off       . metFORMIN (GLUCOPHAGE) 500 MG tablet Take 1 tablet (500 mg total) by mouth 2 (two) times daily with a meal.  180 tablet  0  . simvastatin (ZOCOR) 20 MG tablet Take 1 tablet (20 mg total) by mouth at bedtime.  90 tablet  0  . triamcinolone (NASACORT) 55 MCG/ACT nasal inhaler Place 2 sprays into the nose daily.      Marland Kitchen zolpidem (AMBIEN) 5 MG tablet Take 1 tablet (5 mg total) by mouth at bedtime as needed for sleep.  15 tablet  1   Recent Results (from the past 2160 hour(s))  HEPATIC FUNCTION  PANEL     Status: None   Collection Time    04/22/13  7:33 AM      Result Value Range   Total Bilirubin 0.6  0.3 - 1.2 mg/dL   Bilirubin, Direct 0.1  0.0 - 0.3 mg/dL   Alkaline Phosphatase 68  39 - 117 U/L   AST 17  0 - 37 U/L   ALT 18  0 - 53 U/L   Total Protein 7.5  6.0 - 8.3 g/dL   Albumin 4.1  3.5 - 5.2 g/dL  TSH     Status: Abnormal   Collection Time    04/22/13  7:33 AM      Result Value Range   TSH 5.57 (*) 0.35 - 5.50 uIU/mL  COMPREHENSIVE METABOLIC PANEL     Status: Abnormal   Collection Time    04/22/13  7:33 AM      Result Value Range   Sodium 140  135 - 145 mEq/L   Potassium 4.4  3.5 - 5.1 mEq/L   Chloride 106  96 - 112 mEq/L   CO2 27  19 - 32 mEq/L   Glucose, Bld 122 (*) 70 - 99 mg/dL   BUN 21  6 - 23 mg/dL   Creatinine, Ser 1.2  0.4 - 1.5 mg/dL   Total Bilirubin 0.6  0.3 - 1.2 mg/dL   Alkaline Phosphatase 68   39 - 117 U/L   AST 17  0 - 37 U/L   ALT 18  0 - 53 U/L   Total Protein 7.5  6.0 - 8.3 g/dL   Albumin 4.1  3.5 - 5.2 g/dL   Calcium 9.4  8.4 - 45.4 mg/dL   GFR 09.81  >19.14 mL/min  LIPID PANEL     Status: None   Collection Time    04/22/13  7:33 AM      Result Value Range   Cholesterol 123  0 - 200 mg/dL   Comment: ATP III Classification       Desirable:  < 200 mg/dL               Borderline High:  200 - 239 mg/dL          High:  > = 782 mg/dL   Triglycerides 95.6  0.0 - 149.0 mg/dL   Comment: Normal:  <213 mg/dLBorderline High:  150 - 199 mg/dL   HDL 08.65  >78.46 mg/dL   VLDL 96.2  0.0 - 95.2 mg/dL   LDL Cholesterol 67  0 - 99 mg/dL   Total CHOL/HDL Ratio 3     Comment:                Men          Women1/2 Average Risk     3.4          3.3Average Risk          5.0          4.42X Average Risk          9.6          7.13X Average Risk          15.0          11.0                      CBC WITH DIFFERENTIAL     Status: None   Collection Time    04/22/13  7:33 AM  Result Value Range   WBC 9.4  4.5 - 10.5 K/uL   RBC 4.34  4.22 - 5.81 Mil/uL   Hemoglobin 14.5  13.0 - 17.0 g/dL   HCT 40.9  81.1 - 91.4 %   MCV 98.9  78.0 - 100.0 fl   MCHC 33.8  30.0 - 36.0 g/dL   RDW 78.2  95.6 - 21.3 %   Platelets 276.0  150.0 - 400.0 K/uL   Neutrophils Relative % 56.7  43.0 - 77.0 %   Lymphocytes Relative 31.8  12.0 - 46.0 %   Monocytes Relative 8.1  3.0 - 12.0 %   Eosinophils Relative 3.0  0.0 - 5.0 %   Basophils Relative 0.4  0.0 - 3.0 %   Neutro Abs 5.4  1.4 - 7.7 K/uL   Lymphs Abs 3.0  0.7 - 4.0 K/uL   Monocytes Absolute 0.8  0.1 - 1.0 K/uL   Eosinophils Absolute 0.3  0.0 - 0.7 K/uL   Basophils Absolute 0.0  0.0 - 0.1 K/uL  VITAMIN B12     Status: None   Collection Time    04/22/13  7:33 AM      Result Value Range   Vitamin B-12 627  211 - 911 pg/mL  HEMOGLOBIN A1C     Status: Abnormal   Collection Time    04/22/13  7:33 AM      Result Value Range   Hemoglobin A1C 6.8 (*) 4.6 -  6.5 %   Comment: Glycemic Control Guidelines for People with Diabetes:Non Diabetic:  <6%Goal of Therapy: <7%Additional Action Suggested:  >8%        No results found.  Review of Systems  Constitutional: Positive for fever, chills and malaise/fatigue. Negative for weight loss and diaphoresis.  HENT: Negative for hearing loss, congestion, tinnitus and ear discharge.   Eyes: Negative.   Respiratory: Positive for cough, sputum production and shortness of breath. Negative for hemoptysis and wheezing.   Cardiovascular: Negative for chest pain, palpitations, orthopnea and leg swelling.  Gastrointestinal: Positive for abdominal pain and constipation. Negative for heartburn, nausea, vomiting, diarrhea and blood in stool.  Genitourinary: Negative.   Musculoskeletal: Negative.   Skin: Negative.   Neurological: Positive for dizziness and weakness. Negative for tingling, tremors, speech change, focal weakness and headaches.  Endo/Heme/Allergies: Negative.   Psychiatric/Behavioral: Negative.     There were no vitals taken for this visit. Physical Exam  There were no vitals filed for this visit.  Gen'l - elderly white male who is much weaker than usual but in no distress HEENT- C&S clear, TM's normal, throat clear, no oral lesions Neck - supple, no thyromegaly Nodes- negative neck, axilla Cor 2+ radial pulse, regular tachycardia, II/VI mm LSB, Apex PUlm - normal respirations, no rales or wheezes, negative egophony, negative pectoriloquy. Abd - BS hypoactive, no guarding, very tender RLQ with rebound, no HSM Genitalia - deferred Rectal - deferred Neuro - A&O x 3 CN II-XII grossly intact.  Derm - no rash or skin lesions noted.  Assessment/Plan 1. Fever - patient with several days of high fever and weakness. On exam the primary finding was RLQ abdominal pain: diverticulitis vs other. Respiratory exam was cler  Plan Med/surg admit  Antibiotics - with a possible intra-abdominal infection will  cover with Zosyn with consult from pharmacy due to patients age  Lab: blood cultures x 2 sites; CBCD, Cmet  Imaging: CXR; CT abd/pelvis with contrast.  Illene Regulus 04/28/2013, 12:00 PM

## 2013-04-28 NOTE — Progress Notes (Signed)
  Subjective:    Patient ID: Jeremy Stone, male    DOB: Oct 25, 1925, 79 y.o.   MRN: [id number]  HPI  Presents with several days of high fever. He is admitted for evaluation.   Review of Systems     Objective:   Physical Exam        Assessment & Plan:

## 2013-04-28 NOTE — Progress Notes (Signed)
Subjective: [name] continues to cough. He has not appetite. He is in no distress  Objective: Lab:  Recent Labs  04/28/13 1320  WBC 10.2  NEUTROABS 8.2*  HGB 13.5  HCT 38.8*  MCV 93.3  PLT 223    Recent Labs  04/28/13 1320  NA 134*  K 3.9  CL 99  GLUCOSE 149*  BUN 17  CREATININE 1.19  CALCIUM 9.1    Imaging: CXR: IMPRESSION:  1. New right lower lobe airspace disease is concerning for  pneumonia.  2. Emphysema.  CT abd/pelvis - IMPRESSION:  1. Progressive herniation of the proximal sigmoid colon into a  left inguinal hernia.  2. Sigmoid diverticulosis without evidence for diverticulitis.  3. Atherosclerosis without aneurysm. Coronary artery disease is  present.  4. Stable small hiatal hernia.  5. New right lower lobe airspace disease likely represents  pneumonia. Aspiration is considered less likely.  6. New nonobstructing left upper pole 5 mm kidney stone.  Scheduled Meds: . [START ON 04/29/2013] amLODipine  5 mg Oral Daily  . aspirin  325 mg Oral Daily  . dextromethorphan-guaiFENesin  1 tablet Oral Q12H  . enoxaparin (LOVENOX) injection  40 mg Subcutaneous Daily  . fluticasone  1 spray Each Nare Daily  . insulin aspart  0-15 Units Subcutaneous Q4H  . loratadine  10 mg Oral Daily  . pantoprazole  40 mg Oral Daily  . piperacillin-tazobactam (ZOSYN)  IV  3.375 g Intravenous Q8H  . saccharomyces boulardii  250 mg Oral BID  . senna  1 tablet Oral BID  . simvastatin  20 mg Oral QHS  . traMADol  50 mg Oral Q6H   Continuous Infusions: . sodium chloride 50 mL/hr (04/28/13 1250)   PRN Meds:.acetaminophen, acetaminophen, ondansetron (ZOFRAN) IV, ondansetron, zolpidem   Physical Exam: Filed Vitals:   04/28/13 1614  BP:   Pulse:   Temp: 98.5 F (36.9 C)  Resp:         Assessment/Plan: 1. ID/Fever - most likely pneumonia. However, with murmurs on exam will look at valves.  Plan AM lab: CBCD, BMET  2 D echo  Flutter valve - 5 min every hour  while awake  2. Elevated blood sugar. Last A1C July 1, '14 6.8 % Plan Continue ss.   Will advance diet to low carb     [employer] IM (o) [phone]; (c) [phone] Call-grp - Jeremy Stone IM  Tele: [phone]  04/28/2013, 5:56 PM

## 2013-04-28 NOTE — Telephone Encounter (Signed)
APPT today at 10:30.

## 2013-04-28 NOTE — Progress Notes (Signed)
ANTIBIOTIC CONSULT NOTE - INITIAL  Pharmacy Consult for Zosyn  Indication: r/o abdominal infection  No Known Allergies  Patient Measurements: Height: 5\' 7"  (170.2 cm) Weight: 203 lb 9.6 oz (92.352 kg) IBW/kg (Calculated) : 66.1   Vital Signs: Temp: 101.4 F (38.6 C) (07/07 1221) Temp src: Oral (07/07 1221) BP: 141/68 mmHg (07/07 1221) Pulse Rate: 117 (07/07 1221) Intake/Output from previous day:   Intake/Output from this shift:    Labs: No results found for this basename: WBC, HGB, PLT, LABCREA, CREATININE,  in the last 72 hours Estimated Creatinine Clearance: 48.8 ml/min (by C-G formula based on Cr of 1.2). No results found for this basename: VANCOTROUGH, VANCOPEAK, VANCORANDOM, GENTTROUGH, GENTPEAK, GENTRANDOM, TOBRATROUGH, TOBRAPEAK, TOBRARND, AMIKACINPEAK, AMIKACINTROU, AMIKACIN,  in the last 72 hours   Microbiology: No results found for this or any previous visit (from the past 720 hour(s)).  Medical History: Past Medical History  Diagnosis Date  . NIDDM (non-insulin dependent diabetes mellitus)   . Hyperlipidemia   . Thyroid nodule   . Hypertension   . Trigeminal neuralgia   . GERD (gastroesophageal reflux disease)   . OSA (obstructive sleep apnea)     Medications:  Prescriptions prior to admission  Medication Sig Dispense Refill  . acetaminophen (TYLENOL) 500 MG tablet Take 500 mg by mouth every 6 (six) hours as needed for pain (fever).      Marland Kitchen amLODipine (NORVASC) 5 MG tablet Take 1 tablet (5 mg total) by mouth daily.  90 tablet  0  . amoxicillin (AMOXIL) 875 MG tablet Take 875 mg by mouth 2 (two) times daily. Started on 04-24-13 for 7 days.      Marland Kitchen aspirin 325 MG tablet Take 325 mg by mouth daily.        . cetirizine (ZYRTEC) 10 MG tablet Take 10 mg by mouth daily.      . cholecalciferol (VITAMIN D) 1000 UNITS tablet Take 1,000 Units by mouth daily.      . Coenzyme Q10 (CO Q-10) 100 MG CAPS Take by mouth daily.      . Cyanocobalamin (VITAMIN B 12 PO) Take by  mouth daily.      Marland Kitchen dextromethorphan-guaiFENesin (MUCINEX DM) 30-600 MG per 12 hr tablet Take 1 tablet by mouth every 12 (twelve) hours.      Marland Kitchen ibuprofen (ADVIL,MOTRIN) 200 MG tablet Take 200 mg by mouth every 6 (six) hours as needed for pain.      . simvastatin (ZOCOR) 20 MG tablet Take 1 tablet (20 mg total) by mouth at bedtime.  90 tablet  0  . triamcinolone (NASACORT) 55 MCG/ACT nasal inhaler Place 2 sprays into the nose daily.       Scheduled:  . [START ON 04/29/2013] amLODipine  5 mg Oral Daily  . aspirin  325 mg Oral Daily  . dextromethorphan-guaiFENesin  1 tablet Oral Q12H  . enoxaparin (LOVENOX) injection  40 mg Subcutaneous Daily  . fluticasone  1 spray Each Nare Daily  . insulin aspart  0-15 Units Subcutaneous Q4H  . loratadine  10 mg Oral Daily  . pantoprazole  40 mg Oral Daily  . piperacillin-tazobactam (ZOSYN)  IV  3.375 g Intravenous Q8H  . saccharomyces boulardii  250 mg Oral BID  . senna  1 tablet Oral BID  . simvastatin  20 mg Oral QHS  . traMADol  50 mg Oral Q6H   Infusions:  . sodium chloride 50 mL/hr (04/28/13 1250)   PRN: acetaminophen, acetaminophen, ondansetron (ZOFRAN) IV, ondansetron, zolpidem Anti-infectives  Start     Dose/Rate Route Frequency Ordered Stop   04/28/13 1400  piperacillin-tazobactam (ZOSYN) IVPB 3.375 g     3.375 g 12.5 mL/hr over 240 Minutes Intravenous 3 times per day 04/28/13 1238       Assessment:  79 yo M with reported high fever for several days and RLQ abdominal tenderness, starting empiric zosyn.  Patient has been on amoxacillin x 5 days for URI diagnosed by PCP  Currently with temperature 101.4  BMET pending, Scr from 04/22/13 was 1.2  Blood cultures ordered  Goal of Therapy:  Zosyn per renal function   Plan:  1.) Zosyn 3.375 grams IV q8h, extended infusion  2.) F/u BMET and source of infection   BorgerdingLoma Messing PharmD Pager #: [phone] 1:04 PM 04/28/2013

## 2013-04-28 NOTE — Telephone Encounter (Signed)
Fever is from 101 -103.  Breathing problems.  Wants to be put in the hospital.

## 2013-04-28 NOTE — Telephone Encounter (Signed)
OV this AM - will make a direct admission to hospital if appropriate (most likely)

## 2013-04-29 ENCOUNTER — Ambulatory Visit: Payer: Medicare PPO

## 2013-04-29 ENCOUNTER — Inpatient Hospital Stay (HOSPITAL_COMMUNITY): Payer: Medicare PPO

## 2013-04-29 DIAGNOSIS — E119 Type 2 diabetes mellitus without complications: Secondary | ICD-10-CM

## 2013-04-29 DIAGNOSIS — I517 Cardiomegaly: Secondary | ICD-10-CM

## 2013-04-29 DIAGNOSIS — R509 Fever, unspecified: Secondary | ICD-10-CM

## 2013-04-29 LAB — GLUCOSE, CAPILLARY
Glucose-Capillary: 140 mg/dL — ABNORMAL HIGH (ref 70–99)
Glucose-Capillary: 111 mg/dL — ABNORMAL HIGH (ref 70–99)
Glucose-Capillary: 154 mg/dL — ABNORMAL HIGH (ref 70–99)

## 2013-04-29 LAB — CBC WITH DIFFERENTIAL/PLATELET
Basophils Absolute: 0 10*3/uL (ref 0.0–0.1)
Lymphocytes Relative: 11 % — ABNORMAL LOW (ref 12–46)
Lymphs Abs: 1.2 10*3/uL (ref 0.7–4.0)
Neutro Abs: 8.3 10*3/uL — ABNORMAL HIGH (ref 1.7–7.7)
Neutrophils Relative %: 79 % — ABNORMAL HIGH (ref 43–77)
Platelets: 243 10*3/uL (ref 150–400)
RBC: 4.1 MIL/uL — ABNORMAL LOW (ref 4.22–5.81)
RDW: 13.6 % (ref 11.5–15.5)
WBC: 10.5 10*3/uL (ref 4.0–10.5)

## 2013-04-29 LAB — BASIC METABOLIC PANEL
Calcium: 8.9 mg/dL (ref 8.4–10.5)
Creatinine, Ser: 1.31 mg/dL (ref 0.50–1.35)
GFR calc Af Amer: 56 mL/min — ABNORMAL LOW (ref >90)
Sodium: 132 mEq/L — ABNORMAL LOW (ref 135–145)

## 2013-04-29 LAB — STREP PNEUMONIAE URINARY ANTIGEN: Strep Pneumo Urinary Antigen: NEGATIVE

## 2013-04-29 MED ORDER — ENSURE COMPLETE PO LIQD
237.0000 mL | Freq: Two times a day (BID) | ORAL | Status: DC
  Administered 2013-04-29 – 2013-04-30 (×3): 237 mL via ORAL

## 2013-04-29 MED ORDER — INSULIN ASPART 100 UNIT/ML ~~LOC~~ SOLN
0.0000 [IU] | Freq: Three times a day (TID) | SUBCUTANEOUS | Status: DC
  Administered 2013-04-29: 2 [IU] via SUBCUTANEOUS
  Administered 2013-04-29: 3 [IU] via SUBCUTANEOUS
  Administered 2013-04-30 – 2013-05-02 (×5): 2 [IU] via SUBCUTANEOUS

## 2013-04-29 NOTE — Progress Notes (Signed)
Subjective: Having echo. He does feel better, has had good relief with nebs, has been using flutter valve and has had a bit of an appetite  Objective: Lab:  Recent Labs  04/28/13 1320 04/29/13 0346  WBC 10.2 10.5  NEUTROABS 8.2* 8.3*  HGB 13.5 13.1  HCT 38.8* 38.6*  MCV 93.3 94.1  PLT 223 243  79% segs, 11%lymphs  Recent Labs  04/28/13 1320 04/29/13 0346  NA 134* 132*  K 3.9 3.7  CL 99 98  GLUCOSE 149* 136*  BUN 17 19  CREATININE 1.19 1.31  CALCIUM 9.1 8.9   Blood cx's x 2 pending Imaging:  Scheduled Meds: . albuterol  2.5 mg Nebulization QID  . amLODipine  5 mg Oral Daily  . aspirin  325 mg Oral Daily  . dextromethorphan-guaiFENesin  1 tablet Oral Q12H  . enoxaparin (LOVENOX) injection  40 mg Subcutaneous Daily  . fluticasone  1 spray Each Nare Daily  . insulin aspart  0-15 Units Subcutaneous Q4H  . loratadine  10 mg Oral Daily  . pantoprazole  40 mg Oral Daily  . piperacillin-tazobactam (ZOSYN)  IV  3.375 g Intravenous Q8H  . saccharomyces boulardii  250 mg Oral BID  . senna  1 tablet Oral BID  . simvastatin  20 mg Oral QHS  . traMADol  50 mg Oral Q6H   Continuous Infusions: . sodium chloride 50 mL/hr (04/28/13 1250)   PRN Meds:.acetaminophen, acetaminophen, ondansetron (ZOFRAN) IV, ondansetron, zolpidem   Physical Exam: Filed Vitals:   04/29/13 0558  BP:   Pulse:   Temp: 100.8 F (38.2 C)  Resp:         Assessment/Plan: 1. ID/pul - working diagnosis is PNA but he is still having fevers. Echo in progress to r/o SBE. X-ray is pending. Cultures pending  Plan Continue present antibiotic regimen  2. DM - will convert to AC/HS w/ no night time coverage.  3. Deconditioning - will consult PT when he is a little stronger.      Illene Regulus [facility] IM (o) [phone]; (c) [phone] Call-grp - Patsi Sears IM  Tele: [phone]  04/29/2013, 8:27 AM

## 2013-04-29 NOTE — Progress Notes (Signed)
INITIAL NUTRITION ASSESSMENT  DOCUMENTATION CODES Per approved criteria  -Severe malnutrition in the context of acute illness or injury -Obesity Unspecified  Pt meets criteria for Severe MALNUTRITION in the context of acute illness as evidenced by >2% wt loss in 1 week and energy intake <50% of estimated energy requirements for >5 days.  INTERVENTION: Provide Ensure Complete BID until appetite improves Encourage PO intake  NUTRITION DIAGNOSIS: Inadequate oral intake related to poor appetite/current illness as evidenced by pt's report of eating 25% of PO usual intake for past 7 days.   Goal: Pt to meet >/= 90% of their estimated nutrition needs  Monitor:  PO intake Weight Labs  Reason for Assessment: Malnutrition screening Tool, score of 3  79 y.o. male  Admitting Dx: PNA  ASSESSMENT: 79 year old male who was seen at the [facility] office recently and diagnosed with URI. He was started on Amoxicillin. For the last 4-5 days he has been having intermittent fevers to 103-105 degrees fahrenheit. He has also had a harsh productive cough along with increased SOB.  Pt reports that he has had no appetite for the past 7 days. He has been eating about 10-25% of meals since admission and reports eating 75% less than usual at home for the past week. Pt reports usual body weight is 213 lbs. Weight at admission was 206 lbs; 2-3% wt loss in past week based on pt's report. Encouraged pt to eat small more frequent amounts and drink Ensure until appetite improves.  Height: Ht Readings from Last 1 Encounters:  04/28/13 5\' 7"  (1.702 m)    Weight: Wt Readings from Last 1 Encounters:  04/29/13 209 lb 14.4 oz (95.21 kg)    Ideal Body Weight: 148 lbs  % Ideal Body Weight: 141%  Wt Readings from Last 10 Encounters:  04/29/13 209 lb 14.4 oz (95.21 kg)  04/28/13 206 lb (93.441 kg)  04/24/13 210 lb (95.255 kg)  04/21/13 211 lb (95.709 kg)  04/02/13 216 lb (97.977 kg)  03/14/13 214 lb (97.07  kg)  03/25/12 212 lb (96.163 kg)  03/09/11 218 lb (98.884 kg)  12/30/10 217 lb (98.431 kg)  03/17/10 215 lb (97.523 kg)    Usual Body Weight: 213 lbs  % Usual Body Weight: 98%  BMI:  Body mass index is 32.87 kg/(m^2).  Estimated Nutritional Needs: Kcal: 1970-2140 Protein: 80-95 grams Fluid: 2.6 L  Skin: WDL  Diet Order: Carb Control  EDUCATION NEEDS: -No education needs identified at this time   Intake/Output Summary (Last 24 hours) at 04/29/13 1242 Last data filed at 04/29/13 0440  Gross per 24 hour  Intake 571.83 ml  Output    600 ml  Net -28.17 ml    Last BM: 7/7  Labs:   Recent Labs Lab 04/28/13 1320 04/29/13 0346  NA 134* 132*  K 3.9 3.7  CL 99 98  CO2 25 24  BUN 17 19  CREATININE 1.19 1.31  CALCIUM 9.1 8.9  GLUCOSE 149* 136*    CBG (last 3)   Recent Labs  04/28/13 2343 04/29/13 0417 04/29/13 0734  GLUCAP 94 140* 111*    Scheduled Meds: . albuterol  2.5 mg Nebulization QID  . amLODipine  5 mg Oral Daily  . aspirin  325 mg Oral Daily  . dextromethorphan-guaiFENesin  1 tablet Oral Q12H  . enoxaparin (LOVENOX) injection  40 mg Subcutaneous Daily  . fluticasone  1 spray Each Nare Daily  . insulin aspart  0-15 Units Subcutaneous TID WC  .  loratadine  10 mg Oral Daily  . pantoprazole  40 mg Oral Daily  . piperacillin-tazobactam (ZOSYN)  IV  3.375 g Intravenous Q8H  . saccharomyces boulardii  250 mg Oral BID  . senna  1 tablet Oral BID  . simvastatin  20 mg Oral QHS  . traMADol  50 mg Oral Q6H    Continuous Infusions: . sodium chloride 50 mL/hr (04/28/13 1250)    Past Medical History  Diagnosis Date  . NIDDM (non-insulin dependent diabetes mellitus)   . Hyperlipidemia   . Thyroid nodule   . Hypertension   . Trigeminal neuralgia   . GERD (gastroesophageal reflux disease)   . OSA (obstructive sleep apnea)     Past Surgical History  Procedure Laterality Date  . Appendectomy    . Trigeminal surgery      07-22-07- [facility]     Ian Malkin RD, LDN Inpatient Clinical Dietitian Pager: [phone] After Hours Pager: [phone]

## 2013-04-29 NOTE — Care Management Note (Signed)
CARE MANAGEMENT NOTE 04/29/2013  Patient:  Jeremy Stone, Jeremy Stone   Account Number:  [id number]  Date Initiated:  04/29/2013  Documentation initiated by:  Sabel Hornbeck  Subjective/Objective Assessment:   79 yo male admitted with PNA & fever.     Action/Plan:   Home when stable   Anticipated DC Date:     Anticipated DC Plan:  HOME W HOME HEALTH SERVICES      DC Planning Services  CM consult      Choice offered to / List presented to:  C-1 Patient   DME arranged  NA      DME agency  NA        Status of service:  In process, will continue to follow Medicare Important Message given?   (If response is "NO", the following Medicare IM given date fields will be blank) Date Medicare IM given:   Date Additional Medicare IM given:    Discharge Disposition:    Per UR Regulation:  Reviewed for med. necessity/level of care/duration of stay  If discussed at Long Length of Stay Meetings, dates discussed:    Comments:  04/29/13 1601 Pantelis Elgersma,RN,BSN [phone] Chart reviewed for utilization of services. PT consulted for possible HH servivces.

## 2013-04-29 NOTE — Progress Notes (Signed)
Echocardiogram 2D Echocardiogram has been performed.  Jeremy Stone 04/29/2013, 12:46 PM

## 2013-04-29 NOTE — Progress Notes (Signed)
Mid-day note:  CXR - resolution of RLL infiltrate!! 2 D echo is pending. No repeat vitals since this AM when temp was 100.8  PE: no new vitals Gen'l- overweight white man who was up to BR Cor- RRR, slight Murmur RXB, Apex PUlm - good breath sounds, no rales or wheezes  A/p 1. ID - FUO. With CXR clearing - doubt PNA. Still tender RLQ abdomen but good BS. 2-D echo pending and SBE remains a concern.  Plan Continue antibiotics  Await 2 D echo  Repeat blood culture for T>100  M.Marlisha Vanwyk, MD (c) [phone] (o) [phone]

## 2013-04-30 ENCOUNTER — Encounter (HOSPITAL_COMMUNITY): Payer: Self-pay | Admitting: Radiology

## 2013-04-30 ENCOUNTER — Inpatient Hospital Stay (HOSPITAL_COMMUNITY): Payer: Medicare PPO

## 2013-04-30 DIAGNOSIS — I10 Essential (primary) hypertension: Secondary | ICD-10-CM

## 2013-04-30 DIAGNOSIS — Z228 Carrier of other infectious diseases: Secondary | ICD-10-CM

## 2013-04-30 LAB — URINE MICROSCOPIC-ADD ON

## 2013-04-30 LAB — URINALYSIS, ROUTINE W REFLEX MICROSCOPIC
Glucose, UA: NEGATIVE mg/dL
Leukocytes, UA: NEGATIVE
Specific Gravity, Urine: 1.028 (ref 1.005–1.030)
pH: 5.5 (ref 5.0–8.0)

## 2013-04-30 LAB — GLUCOSE, CAPILLARY: Glucose-Capillary: 189 mg/dL — ABNORMAL HIGH (ref 70–99)

## 2013-04-30 MED ORDER — IOHEXOL 350 MG/ML SOLN
100.0000 mL | Freq: Once | INTRAVENOUS | Status: AC | PRN
  Administered 2013-04-30: 100 mL via INTRAVENOUS

## 2013-04-30 NOTE — Progress Notes (Signed)
PM note - reviewed Dr. Hatcher's note and orders.  D-dimer this PM was positive at 2.4. CTangio: IMPRESSION:  No evidence of pulmonary embolus.  Consolidation in the right lower lobe. Minimal patchy densities in  the posterior left upper lobe and left lower lobe. Findings  concerning for pneumonia. Reactive hilar and mediastinal lymph  nodes.  Dense coronary artery calcifications.  Reviewed work-up in 2010: coags panels were negative!. CT angio chest was normal.  Gave patient result information.  For TEE tomorrow.  

## 2013-04-30 NOTE — Progress Notes (Signed)
Subjective: Per Mrs. Faustino Congress he had a rough night: several fever spikes, associated delerium, hypoxemia with mottling. He is feeling better this morning. Denies pain, shortness of breath or other systemic symptoms. He is anxious and also wants to go home  Objective: Lab:  Recent Labs  04/28/13 1320 04/29/13 0346  WBC 10.2 10.5  NEUTROABS 8.2* 8.3*  HGB 13.5 13.1  HCT 38.8* 38.6*  MCV 93.3 94.1  PLT 223 243    Recent Labs  04/28/13 1320 04/29/13 0346  NA 134* 132*  K 3.9 3.7  CL 99 98  GLUCOSE 149* 136*  BUN 17 19  CREATININE 1.19 1.31  CALCIUM 9.1 8.9   Blood Cx 7/7 - NGTD, 2 sets blood cultures 7/8 were drawn.  Imaging: 2 D echo 7/8: Impressions:  - Technically difficult; LV function is preserved; aortic valve not well visualized but elevated mean gradient of 28 mmHg suggests moderate AS; cannot R/O vegetation with this study; suggest TEE to better assess.  Scheduled Meds: . albuterol  2.5 mg Nebulization QID  . amLODipine  5 mg Oral Daily  . aspirin  325 mg Oral Daily  . dextromethorphan-guaiFENesin  1 tablet Oral Q12H  . enoxaparin (LOVENOX) injection  40 mg Subcutaneous Daily  . feeding supplement  237 mL Oral BID BM  . fluticasone  1 spray Each Nare Daily  . insulin aspart  0-15 Units Subcutaneous TID WC  . loratadine  10 mg Oral Daily  . pantoprazole  40 mg Oral Daily  . piperacillin-tazobactam (ZOSYN)  IV  3.375 g Intravenous Q8H  . saccharomyces boulardii  250 mg Oral BID  . senna  1 tablet Oral BID  . simvastatin  20 mg Oral QHS  . traMADol  50 mg Oral Q6H   Continuous Infusions: . sodium chloride 50 mL/hr (04/30/13 0320)   PRN Meds:.acetaminophen, acetaminophen, ondansetron (ZOFRAN) IV, ondansetron, zolpidem   Physical Exam: Filed Vitals:   04/30/13 0521  BP: 127/66  Pulse: 89  Temp: 98.6 F (37 C)  Resp: 20    Intake/Output Summary (Last 24 hours) at 04/30/13 0826 Last data filed at 04/30/13 0600  Gross per 24 hour  Intake    1315 ml  Output    500 ml  Net    815 ml   Gen'l - elderly white man in no distress this AM HEENT- difficult fundiscopic exam with poor illumination and patient with cataracts but no roth's spots, throat clear Neck - supple Nodes - negative throughout Cor - RRR II/VI systolic mm RSB, LSB, lower LSB, no JVD Pulm - feint rales at the left base. No wheezes Abd - soft, non-tender Derm - no subungual hemorrhage, no rash.      Assessment/Plan: 1. ID - persistent fever spikes without source to date. Blood cx's pending. White count remains normal but with left shift. No focal pain. No evidence of malignance. 2 D echo non-revealing.  Plan Follow up CXR with new rales left base  Continue zosyn   TEE - request to cardiology placed  - probably for tomorrow   Illene Regulus [facility] (o) [phone]; (c) [phone] Call-grp - [facility]  Tele: [phone]  04/30/2013, 7:56 AM

## 2013-04-30 NOTE — Clinical Documentation Improvement (Signed)
THIS DOCUMENT IS NOT A PERMANENT PART OF THE MEDICAL RECORD  Please update your documentation with the medical record to reflect your response to this query. If you need help knowing how to do this please call [phone].  04/30/13   Dear Dr. Debby Bud / Associates,  In a better effort to capture your patient's severity of illness, reflect appropriate length of stay and utilization of resources, a review of the patient medical record has revealed the following indicators.    Based on your clinical judgment, please clarify and document in a progress note and/or discharge summary the clinical condition associated with the following supporting information:  In responding to this query please exercise your independent judgment.  The fact that a query is asked, does not imply that any particular answer is desired or expected. INITIAL NUTRITION ASSESSMENT on 04/29/13   Please specify the clarification of degree/type of malnutrition Possible Clinical Conditions?  _______Mild Malnutrition  _______Moderate Malnutrition _______Severe Malnutrition   _______Protein Calorie Malnutrition _______Severe Protein Calorie Malnutrition   _______Other Condition________________ _______Cannot clinically determine     Supporting Information: Risk Factors: PNA, diabetes mellitus, GERD, OSA, Hyperlipidemia   Signs & Symptoms: Ht 5\' 7"      Wt: 209 lbs    BMI:32.87  Weight  Loss: "Pt reports that he has had no appetite for the past 7 days. He has been eating about 10-25% of meals since admission and reports eating 75% less than usual at home for the past week. Pt reports usual body weight is 213 lbs. Weight at admission was 206 lbs; 2-3% wt loss in past week based on pt's report. Encouraged pt to eat small more frequent amounts and drink Ensure until appetite improves."  Diagnostics:  Albumin level:  3.0  Total Protein: 7.4  Calcium level: 9.1  Treatment:INTERVENTION:  Provide Ensure Complete BID until  appetite improves  Encourage PO intake    Medications:  Protonix, Ensure  Nutrition Consult: Pt meets criteria for Severe MALNUTRITION in the context of acute illness as evidenced by >2% wt loss in 1 week and energy intake <50% of estimated energy requirements for >5 days.    You may use possible, probable, or suspect with inpatient documentation. possible, probable, suspected diagnoses MUST be documented at the time of discharge  Reviewed: see discharge summary for documentation  Thank You,  Andy Gauss  Clinical Documentation Specialist: [phone] Health Information Management [place]

## 2013-04-30 NOTE — Consult Note (Addendum)
INFECTIOUS DISEASE CONSULT NOTE  Date of Admission:  04/28/2013  Date of Consult:  04/30/2013  Reason for Consult: Fever Referring Physician: Norins  Impression/Recommendation Fever Returned traveler  Would: Check malaria smear  check serologies for yellow fever, dengue, chickungunya Check HIV RNA Await TEE Check D-dimer, consider CT angio  Check Influenza Check leptospirosis  Comment: Wide differential in this patient was recently returned from travel. Certainly ruling out a DVT and pulmonary embolus the patient has been on a plane flight would be useful after he presents with fever and cough. He does not have abdominal pain or acidosis which could suggest mesenteric ischemia, however this would be a consideration as well in a FUO in older patient. His CT scan suggests a possible incarcerated hernia however he has no symptoms of this and denies having a hernia. With negative blood cultures and less convinced of endocarditis in etiology in this patient.  Certainly the flu seasons reverse depending on the hemisphere (Winter in the Northern hemisphere). Leptospirosis would be a possibility given that he was exposed to a fresh water lake, however his liver function tests are normal and this would seem less likely. His problem list has "chronic pulmonary embolism" and "priamry hypercoaguable state" however he is not on anti-coagulation.   Thank you so much for this interesting consult,   Jeremy Stone (pager) [phone] [email or web address]  Jeremy Stone is an 79 y.o. male.  HPI: 79 yo M with hx of DM (x 15 years), OSA, and a thyroid nodule (TSH 5.57 on 04-22-13). Patient was in [place] from 04/10/13 to 04/14/2013. While there he had no bug bites. He did not swim in fresh or salt water. He drank only bottled water. He took no anti-malaria prophylaxis. He received no vaccinations prior to departure. By June 30 he had developed fever at home as well as fatigue. He then developed a  cough and was tx for URI with amoxicillin. Over 4-5 days prior to admission he had temps up to 105, RLQ pain and non-productive cough. On admission he was started on zosyn for concern for diverticulitis.  His WBC was normal on admission, temp was 102.8. He underwent CXR showing new RLL airspace dz; CT abd/pelvis showed herniation of proximal sigmoid into L inguinal hernia, sigmoid diverticulitis without diverticulitis. He has undergone TTE which was non-conclusive for IE, notable for moderate AS. He has continued to have temps to 102.2.    Past Medical History  Diagnosis Date  . NIDDM (non-insulin dependent diabetes mellitus)   . Hyperlipidemia   . Thyroid nodule   . Hypertension   . Trigeminal neuralgia   . GERD (gastroesophageal reflux disease)   . OSA (obstructive sleep apnea)     Past Surgical History  Procedure Laterality Date  . Appendectomy    . Trigeminal surgery      07-22-07- [facility]     Allergies  Allergen Reactions  . Lactose Intolerance (Gi)     Medications:  Scheduled: . albuterol  2.5 mg Nebulization QID  . amLODipine  5 mg Oral Daily  . aspirin  325 mg Oral Daily  . dextromethorphan-guaiFENesin  1 tablet Oral Q12H  . enoxaparin (LOVENOX) injection  40 mg Subcutaneous Daily  . feeding supplement  237 mL Oral BID BM  . fluticasone  1 spray Each Nare Daily  . insulin aspart  0-15 Units Subcutaneous TID WC  . loratadine  10 mg Oral Daily  . pantoprazole  40 mg Oral Daily  . piperacillin-tazobactam (ZOSYN)  IV  3.375 g Intravenous Q8H  . saccharomyces boulardii  250 mg Oral BID  . senna  1 tablet Oral BID  . simvastatin  20 mg Oral QHS  . traMADol  50 mg Oral Q6H    Total days of antibiotics: 3           Social History:  reports that he quit smoking about 30 years ago. His smoking use included Cigarettes. He has a 20 pack-year smoking history. He has never used smokeless tobacco. He reports that he drinks about 5.0 ounces of alcohol per week. He reports that  he does not use illicit drugs.  Family History  Problem Relation Age of Onset  . Dementia Sister     General ROS: No pets. No diarrhea. Since in the hospital he has had loose stools however decreased frequency. His cough is nonproductive. He's had no rashes. See history of present illness. No dysphagia, non LAN. No change in color of stool or urine.  He complains of loss of voice.   Blood pressure 128/63, pulse 101, temperature 98.3 F (36.8 C), temperature source Oral, resp. rate 20, height 5\' 7"  (1.702 m), weight 95.21 kg (209 lb 14.4 oz), SpO2 94.00%. General appearance: alert, cooperative and no distress Eyes: negative findings: conjunctivae and sclerae normal and pupils equal, round, reactive to light and accomodation Throat: lips, mucosa, and tongue normal; teeth and gums normal Neck: no adenopathy and supple, symmetrical, trachea midline Lungs: clear to auscultation bilaterally Heart: regular rate and rhythm Abdomen: normal findings: bowel sounds normal and soft, non-tender and mild distension Extremities: edema none Skin: Skin color, texture, turgor normal. No rashes or lesions Neurologic: Grossly normal   Results for orders placed during the hospital encounter of 04/28/13 (from the past 48 hour(s))  GLUCOSE, CAPILLARY     Status: None   Collection Time    04/28/13  4:08 PM      Result Value Range   Glucose-Capillary 99  70 - 99 mg/dL  GLUCOSE, CAPILLARY     Status: Abnormal   Collection Time    04/28/13  8:22 PM      Result Value Range   Glucose-Capillary 122 (*) 70 - 99 mg/dL  GLUCOSE, CAPILLARY     Status: None   Collection Time    04/28/13 11:43 PM      Result Value Range   Glucose-Capillary 94  70 - 99 mg/dL  BASIC METABOLIC PANEL     Status: Abnormal   Collection Time    04/29/13  3:46 AM      Result Value Range   Sodium 132 (*) 135 - 145 mEq/L   Potassium 3.7  3.5 - 5.1 mEq/L   Chloride 98  96 - 112 mEq/L   CO2 24  19 - 32 mEq/L   Glucose, Bld 136 (*)  70 - 99 mg/dL   BUN 19  6 - 23 mg/dL   Creatinine, Ser 8.11  0.50 - 1.35 mg/dL   Calcium 8.9  8.4 - 91.4 mg/dL   GFR calc non Af Amer 48 (*) >90 mL/min   GFR calc Af Amer 56 (*) >90 mL/min   Comment:            The eGFR has been calculated     using the CKD EPI equation.     This calculation has not been     validated in all clinical     situations.     eGFR's persistently     <90 mL/min signify  possible Chronic Kidney Disease.  CBC WITH DIFFERENTIAL     Status: Abnormal   Collection Time    04/29/13  3:46 AM      Result Value Range   WBC 10.5  4.0 - 10.5 K/uL   RBC 4.10 (*) 4.22 - 5.81 MIL/uL   Hemoglobin 13.1  13.0 - 17.0 g/dL   HCT 96.0 (*) 45.4 - 09.8 %   MCV 94.1  78.0 - 100.0 fL   MCH 32.0  26.0 - 34.0 pg   MCHC 33.9  30.0 - 36.0 g/dL   RDW 11.9  14.7 - 82.9 %   Platelets 243  150 - 400 K/uL   Neutrophils Relative % 79 (*) 43 - 77 %   Neutro Abs 8.3 (*) 1.7 - 7.7 K/uL   Lymphocytes Relative 11 (*) 12 - 46 %   Lymphs Abs 1.2  0.7 - 4.0 K/uL   Monocytes Relative 9  3 - 12 %   Monocytes Absolute 0.9  0.1 - 1.0 K/uL   Eosinophils Relative 0  0 - 5 %   Eosinophils Absolute 0.0  0.0 - 0.7 K/uL   Basophils Relative 0  0 - 1 %   Basophils Absolute 0.0  0.0 - 0.1 K/uL  GLUCOSE, CAPILLARY     Status: Abnormal   Collection Time    04/29/13  4:17 AM      Result Value Range   Glucose-Capillary 140 (*) 70 - 99 mg/dL  GLUCOSE, CAPILLARY     Status: Abnormal   Collection Time    04/29/13  7:34 AM      Result Value Range   Glucose-Capillary 111 (*) 70 - 99 mg/dL  STREP PNEUMONIAE URINARY ANTIGEN     Status: None   Collection Time    04/29/13 11:47 AM      Result Value Range   Strep Pneumo Urinary Antigen NEGATIVE  NEGATIVE   Comment:            Infection due to S. pneumoniae     cannot be absolutely ruled out     since the antigen present     may be below the detection limit     of the test.  GLUCOSE, CAPILLARY     Status: Abnormal   Collection Time    04/29/13  12:01 PM      Result Value Range   Glucose-Capillary 154 (*) 70 - 99 mg/dL  GLUCOSE, CAPILLARY     Status: Abnormal   Collection Time    04/29/13  5:09 PM      Result Value Range   Glucose-Capillary 126 (*) 70 - 99 mg/dL  GLUCOSE, CAPILLARY     Status: Abnormal   Collection Time    04/29/13  7:46 PM      Result Value Range   Glucose-Capillary 144 (*) 70 - 99 mg/dL   Comment 1 Notify RN    GLUCOSE, CAPILLARY     Status: Abnormal   Collection Time    04/30/13  6:46 AM      Result Value Range   Glucose-Capillary 137 (*) 70 - 99 mg/dL   Comment 1 Notify RN    URINALYSIS, ROUTINE W REFLEX MICROSCOPIC     Status: Abnormal   Collection Time    04/30/13 10:09 AM      Result Value Range   Color, Urine YELLOW  YELLOW   APPearance CLOUDY (*) CLEAR   Specific Gravity, Urine 1.028  1.005 - 1.030   pH  5.5  5.0 - 8.0   Glucose, UA NEGATIVE  NEGATIVE mg/dL   Hgb urine dipstick SMALL (*) NEGATIVE   Bilirubin Urine NEGATIVE  NEGATIVE   Ketones, ur 15 (*) NEGATIVE mg/dL   Protein, ur 308 (*) NEGATIVE mg/dL   Urobilinogen, UA 1.0  0.0 - 1.0 mg/dL   Nitrite NEGATIVE  NEGATIVE   Leukocytes, UA NEGATIVE  NEGATIVE  URINE MICROSCOPIC-ADD ON     Status: Abnormal   Collection Time    04/30/13 10:09 AM      Result Value Range   Squamous Epithelial / LPF RARE  RARE   WBC, UA 0-2  <3 WBC/hpf   RBC / HPF 0-2  <3 RBC/hpf   Bacteria, UA FEW (*) RARE   Casts GRANULAR CAST (*) NEGATIVE      Component Value Date/Time   SDES BLOOD LEFT HAND 04/28/2013 1320   SPECREQUEST BOTTLES DRAWN AEROBIC AND ANAEROBIC 10CC 04/28/2013 1320   CULT        BLOOD CULTURE RECEIVED NO GROWTH TO DATE CULTURE WILL BE HELD FOR 5 DAYS BEFORE ISSUING A FINAL NEGATIVE REPORT 04/28/2013 1320   REPTSTATUS PENDING 04/28/2013 1320   Dg Chest 2 View  04/30/2013   *RADIOLOGY REPORT*  Clinical Data: Short of breath, cough for 2 weeks, former smoking history  CHEST - 2 VIEW  Comparison: Portable chest x-ray of 04/29/2013  Findings: The lungs  appear slightly better aerated. There is minimal patchy opacity medially at the right lung base and follow- up is recommended to exclude developing pneumonia.  Some of this opacity is due to overlapping calcified costochondral junction. Mediastinal contours are stable.  The heart is within upper limits of normal.  IMPRESSION: Minimal opacity at the right lung base medially may be due to overlapping calcified costochondral junction but a patchy pneumonia cannot be excluded.  Consider follow-up.   Original Report Authenticated By: Dwyane Dee, M.D.   Dg Chest 2 View  04/29/2013   *RADIOLOGY REPORT*  Clinical Data: Follow-up infiltrates.  Coughing and congestion  CHEST - 2 VIEW  Comparison: 04/28/2013  Findings: Lower lung volumes are present and taking this into consideration heart and mediastinal contours are stable. Mild aortic ectasia is again identified.  There has been interval clearing of the right lower lobe infiltrate since the previous exam.  The lung fields now appear clear with no signs of focal infiltrate or congestive failure.  An increase in basilar interstitial markings correlates with underlying bronchitic change.  Bony structures are notable for degenerative change about both shoulder girdles and diffuse osteophytosis of the thoracic spine.  IMPRESSION: Interval resolution of right lower lobe infiltrate.  Lower lung volumes with no new worrisome focal or acute abnormality identified   Original Report Authenticated By: Rhodia Albright, M.D.   X-ray Chest Pa And Lateral   04/28/2013   *RADIOLOGY REPORT*  Clinical Data: Fever and productive cough.  CHEST - 2 VIEW  Comparison: CTA chest 08/06/2009.  Findings: The heart size is normal.  Emphysematous changes are present.  Right lower lobe airspace disease is concerning for pneumonia.  Minimal atelectasis is present at the left base. Degenerative changes are noted in the shoulders bilaterally. Ankylosis of the thoracic spine is evident.  IMPRESSION:  1.   New right lower lobe airspace disease is concerning for pneumonia. 2.  Emphysema.   Original Report Authenticated By: Marin Roberts, M.D.   Ct Abdomen Pelvis W Contrast  04/28/2013   *RADIOLOGY REPORT*  Clinical Data: Right lower quadrant abdominal  pain.  Fever. Appendectomy.  Irritable bowel syndrome.  CT ABDOMEN AND PELVIS WITH CONTRAST  Technique:  Multidetector CT imaging of the abdomen and pelvis was performed following the standard protocol during bolus administration of intravenous contrast.  Contrast: OMNIPAQUE IOHEXOL 300 MG/ML  SOLN  Comparison: CT abdomen and pelvis 02/23/2007.  Findings: Patchy right lower lobe airspace disease is compatible with pneumonia.  Minimal peripheral fibrotic changes are noted at the left base.  The heart size is normal.  The coronary artery calcifications are present.  A small hiatal hernia is noted.  No significant pleural or pericardial effusion is present.  The liver and spleen are within normal limits.  The stomach is otherwise within normal limits.  The duodenum and pancreas are unremarkable.  The common bile duct and gallbladder are normal. The adrenal glands are normal bilaterally.  A cystic lesion at the lower pole of the left kidney has increased in size.  Two smaller hypodense lesions in the left kidney are not significantly changed in size.  Ureters are within normal limits bilaterally.  A nonobstructing 5 mm stone is present at the upper pole of the left kidney.  Extensive sigmoid diverticular changes are present without to definite inflammation to suggest diverticulitis.  A loop of the more proximal sigmoid colon extends into a left inguinal hernia without evidence for obstruction.  The more proximal colon is within normal limits.  The cecum is normal.  The small bowel is unremarkable.  No significant adenopathy or free fluid is present. Mild prominence of the prostate gland is not significantly changed.  Advanced degenerative changes of the  thoracolumbar spine are again noted.  There is interval fusion across the L1-2 and L4-5 disc levels.  Ankylosis of the thoracolumbar spine is near complete with the exception of the L5-S1 where a vacuum disc appears more prominent than on the prior exam.  IMPRESSION:  1.  Progressive herniation of the proximal sigmoid colon into a left inguinal hernia. 2.  Sigmoid diverticulosis without evidence for diverticulitis. 3.  Atherosclerosis without aneurysm.  Coronary artery disease is present. 4.  Stable small hiatal hernia. 5.  New right lower lobe airspace disease likely represents pneumonia.  Aspiration is considered less likely.  6.  New nonobstructing left upper pole 5 mm kidney stone.   Original Report Authenticated By: Marin Roberts, M.D.   Recent Results (from the past 240 hour(s))  CULTURE, BLOOD (ROUTINE X 2)     Status: None   Collection Time    04/28/13  1:05 PM      Result Value Range Status   Specimen Description BLOOD LEFT HAND   Final   Special Requests Normal   Final   Culture  Setup Time 04/28/2013 22:49   Final   Culture     Final   Value:        BLOOD CULTURE RECEIVED NO GROWTH TO DATE CULTURE WILL BE HELD FOR 5 DAYS BEFORE ISSUING A FINAL NEGATIVE REPORT   Report Status PENDING   Incomplete  CULTURE, BLOOD (ROUTINE X 2)     Status: None   Collection Time    04/28/13  1:20 PM      Result Value Range Status   Specimen Description BLOOD LEFT HAND   Final   Special Requests BOTTLES DRAWN AEROBIC AND ANAEROBIC 10CC   Final   Culture  Setup Time 04/28/2013 22:49   Final   Culture     Final   Value:  BLOOD CULTURE RECEIVED NO GROWTH TO DATE CULTURE WILL BE HELD FOR 5 DAYS BEFORE ISSUING A FINAL NEGATIVE REPORT   Report Status PENDING   Incomplete      04/30/2013, 3:32 PM     LOS: 2 days

## 2013-04-30 NOTE — Progress Notes (Signed)
Pt has home CPAP unit and has stated that he doesn't need any help with his machine, RT to monitor and assess as needed.

## 2013-05-01 ENCOUNTER — Encounter (HOSPITAL_COMMUNITY): Payer: Self-pay | Admitting: Gastroenterology

## 2013-05-01 ENCOUNTER — Encounter (HOSPITAL_COMMUNITY)
Admission: AD | Disposition: A | Discharge status: Home / Self Care | Payer: Self-pay | Source: Ambulatory Visit | Attending: Internal Medicine

## 2013-05-01 DIAGNOSIS — M79609 Pain in unspecified limb: Secondary | ICD-10-CM

## 2013-05-01 DIAGNOSIS — R05 Cough: Secondary | ICD-10-CM

## 2013-05-01 DIAGNOSIS — E43 Unspecified severe protein-calorie malnutrition: Secondary | ICD-10-CM

## 2013-05-01 DIAGNOSIS — I359 Nonrheumatic aortic valve disorder, unspecified: Secondary | ICD-10-CM

## 2013-05-01 DIAGNOSIS — G4733 Obstructive sleep apnea (adult) (pediatric): Secondary | ICD-10-CM

## 2013-05-01 DIAGNOSIS — F05 Delirium due to known physiological condition: Secondary | ICD-10-CM

## 2013-05-01 HISTORY — PX: TEE WITHOUT CARDIOVERSION: SHX5443

## 2013-05-01 LAB — GLUCOSE, CAPILLARY
Glucose-Capillary: 115 mg/dL — ABNORMAL HIGH (ref 70–99)
Glucose-Capillary: 138 mg/dL — ABNORMAL HIGH (ref 70–99)

## 2013-05-01 LAB — HIV-1 RNA QUANT-NO REFLEX-BLD: HIV 1 RNA Quant: <20 copies/mL (ref <20)

## 2013-05-01 SURGERY — ECHOCARDIOGRAM, TRANSESOPHAGEAL
Anesthesia: Moderate Sedation

## 2013-05-01 MED ORDER — FENTANYL CITRATE 0.05 MG/ML IJ SOLN
INTRAMUSCULAR | Status: AC
  Filled 2013-05-01: qty 2

## 2013-05-01 MED ORDER — LEVOFLOXACIN 500 MG PO TABS
500.0000 mg | ORAL_TABLET | Freq: Every day | ORAL | Status: DC
  Administered 2013-05-02: 500 mg via ORAL
  Filled 2013-05-01 (×3): qty 1

## 2013-05-01 MED ORDER — MIDAZOLAM HCL 10 MG/2ML IJ SOLN
INTRAMUSCULAR | Status: DC | PRN
  Administered 2013-05-01 (×2): 2 mg via INTRAVENOUS
  Administered 2013-05-01: 1 mg via INTRAVENOUS

## 2013-05-01 MED ORDER — HALOPERIDOL LACTATE 5 MG/ML IJ SOLN: 1.0000 mg | Freq: Four times a day (QID) | INTRAMUSCULAR | Status: DC | PRN

## 2013-05-01 MED ORDER — MIDAZOLAM HCL 5 MG/ML IJ SOLN
INTRAMUSCULAR | Status: AC
  Filled 2013-05-01: qty 2

## 2013-05-01 MED ORDER — SODIUM CHLORIDE 0.9 % IV SOLN
INTRAVENOUS | Status: DC
  Administered 2013-05-01: 500 mL via INTRAVENOUS

## 2013-05-01 MED ORDER — BUTAMBEN-TETRACAINE-BENZOCAINE 2-2-14 % EX AERO
INHALATION_SPRAY | CUTANEOUS | Status: DC | PRN
  Administered 2013-05-01: 2 via TOPICAL

## 2013-05-01 MED ORDER — ENSURE COMPLETE PO LIQD: 237.0000 mL | Freq: Two times a day (BID) | ORAL | Status: DC

## 2013-05-01 MED ORDER — FENTANYL CITRATE 0.05 MG/ML IJ SOLN
INTRAMUSCULAR | Status: DC | PRN
  Administered 2013-05-01: 25 ug via INTRAVENOUS
  Administered 2013-05-01: 12.5 ug via INTRAVENOUS

## 2013-05-01 MED ORDER — HALOPERIDOL 1 MG PO TABS
1.0000 mg | ORAL_TABLET | Freq: Four times a day (QID) | ORAL | Status: DC | PRN
  Filled 2013-05-01: qty 1

## 2013-05-01 NOTE — Progress Notes (Signed)
*  PRELIMINARY RESULTS* Echocardiogram Echocardiogram Transesophageal has been performed.  Jeryl Columbia 05/01/2013, 3:30 PM

## 2013-05-01 NOTE — Progress Notes (Signed)
Subjective: Jeremy Stone has no change in his condition. Per his wife's report his cough is no better and he doesn't feel better. He has had two nighttime episodes of delirium pulling out his IV, being disoriented, getting out of bed. He has not had any pain. He is bothered by condom catheter. He gets urinary urgency.   Objective: Lab:  Recent Labs  04/29/13 0346  WBC 10.5  NEUTROABS 8.3*  HGB 13.1  HCT 38.6*  MCV 94.1  PLT 243    Recent Labs  04/29/13 0346  NA 132*  K 3.7  CL 98  GLUCOSE 136*  BUN 19  CREATININE 1.31  CALCIUM 8.9   Blood smear - preliminary per Dr. Ninetta Lights negatvie for malaria. Blood Cx's - x8 NGTD Urine for strep antigen - negative  Imaging: CT angio chest - no PE, patchy infiltrates c/w pneumonia.  TEE - negative for vegatations. Scheduled Meds: . albuterol  2.5 mg Nebulization QID  . amLODipine  5 mg Oral Daily  . aspirin  325 mg Oral Daily  . dextromethorphan-guaiFENesin  1 tablet Oral Q12H  . enoxaparin (LOVENOX) injection  40 mg Subcutaneous Daily  . feeding supplement  237 mL Oral BID BM  . fluticasone  1 spray Each Nare Daily  . insulin aspart  0-15 Units Subcutaneous TID WC  . levofloxacin  500 mg Oral Daily  . loratadine  10 mg Oral Daily  . pantoprazole  40 mg Oral Daily  . piperacillin-tazobactam (ZOSYN)  IV  3.375 g Intravenous Q8H  . saccharomyces boulardii  250 mg Oral BID  . senna  1 tablet Oral BID  . simvastatin  20 mg Oral QHS  . traMADol  50 mg Oral Q6H   Continuous Infusions: . sodium chloride 50 mL/hr (04/30/13 0320)  . sodium chloride 500 mL (05/01/13 1153)   PRN Meds:.acetaminophen, acetaminophen, ondansetron (ZOFRAN) IV, ondansetron, zolpidem   Physical Exam: Filed Vitals:   05/01/13 1456  BP: 159/76  Pulse:   Temp: 99.2 F (37.3 C)  Resp: 20    Intake/Output Summary (Last 24 hours) at 05/01/13 1832 Last data filed at 05/01/13 1456  Gross per 24 hour  Intake    988 ml  Output    525 ml  Net    463  ml   Total this Adm: +1,509 Gen'l elderly white man with a wet and harsh cough, no "whooping" observed during the exam. HEENT- C&S clear Cor - RRR Pulm - no increased WOB, wet cough, no wheezing Neuro - A&O during the exam.      Assessment/Plan: 1. FUO - continues to have fever spikes. Micro negative to date, TEE negative, CT chest with patchy infiltrates. Multiple serologies and cultures pending. He doesn't have the joint and bone pain of Dengue, smear negative for malaria. Jeremy Stone is very concerned about pertussis since on the flight from [place] there was a sick child with a severe cough.  Plan AM lab  Increase IV fluid rate  Pertussis culture/PCR   Discussion: question of need to continue zosyn and/or start additional antibiotics will be directed to Dr. Ninetta Lights. If unable to produce adequate sputum specimen may need to consider Naso-tracheal aspiration or request pulmonary consult.  2. Malnutrition - per RD consult patient meets criteria for severe protein/calorie malnutrition with febrile illness and loss ot 2% body-weight in a week's period  Plan Supplements as recommended.  3. Delirium - patient has had several episodes of delirium. Discussed with Mrs. L - preferred treatment is  reorientation and reassurance. Avoiding sedation is is better for the patient.  Greater than 50%of 60 minute visit in discussion and counseling with family. Will try to connect Mrs. L with Dr. Ninetta Lights to better answer her questions   Ill[facility] (o) [phone]; (c) [phone] Call-grp - [facility]  Tele: [phone]  05/01/2013, 5:47 PM

## 2013-05-01 NOTE — Progress Notes (Addendum)
INFECTIOUS DISEASE PROGRESS NOTE  ID: Jeremy Stone is a 79 y.o. male with  Active Problems:   Protein-calorie malnutrition, severe  Subjective: Continued cough, fever. Wants to go home.   Abtx:  Anti-infectives   Start     Dose/Rate Route Frequency Ordered Stop   04/28/13 1400  piperacillin-tazobactam (ZOSYN) IVPB 3.375 g     3.375 g 12.5 mL/hr over 240 Minutes Intravenous 3 times per day 04/28/13 1238        Medications:  Scheduled: . albuterol  2.5 mg Nebulization QID  . amLODipine  5 mg Oral Daily  . aspirin  325 mg Oral Daily  . dextromethorphan-guaiFENesin  1 tablet Oral Q12H  . enoxaparin (LOVENOX) injection  40 mg Subcutaneous Daily  . feeding supplement  237 mL Oral BID BM  . fluticasone  1 spray Each Nare Daily  . insulin aspart  0-15 Units Subcutaneous TID WC  . loratadine  10 mg Oral Daily  . pantoprazole  40 mg Oral Daily  . piperacillin-tazobactam (ZOSYN)  IV  3.375 g Intravenous Q8H  . saccharomyces boulardii  250 mg Oral BID  . senna  1 tablet Oral BID  . simvastatin  20 mg Oral QHS  . traMADol  50 mg Oral Q6H    Objective: Vital signs in last 24 hours: Temp:  [98.9 F (37.2 C)-102.7 F (39.3 C)] 100.8 F (38.2 C) (07/10 1150) Pulse Rate:  [91-107] 99 (07/10 1150) Resp:  [13-38] 32 (07/10 1410) BP: (131-188)/(60-124) 147/71 mmHg (07/10 1410) SpO2:  [89 %-98 %] 90 % (07/10 1410) Weight:  [92.534 kg (204 lb)] 92.534 kg (204 lb) (07/10 0550)   General appearance: alert, cooperative and no distress Resp: rhonchi bilaterally Cardio: regular rate and rhythm GI: normal findings: bowel sounds normal and soft, non-tender  Lab Results  Recent Labs  04/29/13 0346  WBC 10.5  HGB 13.1  HCT 38.6*  NA 132*  K 3.7  CL 98  CO2 24  BUN 19  CREATININE 1.31   Liver Panel No results found for this basename: PROT, ALBUMIN, AST, ALT, ALKPHOS, BILITOT, BILIDIR, IBILI,  in the last 72 hours Sedimentation Rate No results found for this  basename: ESRSEDRATE,  in the last 72 hours C-Reactive Protein No results found for this basename: CRP,  in the last 72 hours  Microbiology: Recent Results (from the past 240 hour(s))  CULTURE, BLOOD (ROUTINE X 2)     Status: None   Collection Time    04/28/13  1:05 PM      Result Value Range Status   Specimen Description BLOOD LEFT HAND   Final   Special Requests Normal   Final   Culture  Setup Time 04/28/2013 22:49   Final   Culture     Final   Value:        BLOOD CULTURE RECEIVED NO GROWTH TO DATE CULTURE WILL BE HELD FOR 5 DAYS BEFORE ISSUING A FINAL NEGATIVE REPORT   Report Status PENDING   Incomplete  CULTURE, BLOOD (ROUTINE X 2)     Status: None   Collection Time    04/28/13  1:20 PM      Result Value Range Status   Specimen Description BLOOD LEFT HAND   Final   Special Requests BOTTLES DRAWN AEROBIC AND ANAEROBIC 10CC   Final   Culture  Setup Time 04/28/2013 22:49   Final   Culture     Final   Value:  BLOOD CULTURE RECEIVED NO GROWTH TO DATE CULTURE WILL BE HELD FOR 5 DAYS BEFORE ISSUING A FINAL NEGATIVE REPORT   Report Status PENDING   Incomplete  CULTURE, BLOOD (ROUTINE X 2)     Status: None   Collection Time    04/29/13  2:45 PM      Result Value Range Status   Specimen Description BLOOD LEFT HAND   Final   Special Requests BOTTLES DRAWN AEROBIC AND ANAEROBIC 10CC   Final   Culture  Setup Time 04/30/2013 01:17   Final   Culture     Final   Value:        BLOOD CULTURE RECEIVED NO GROWTH TO DATE CULTURE WILL BE HELD FOR 5 DAYS BEFORE ISSUING A FINAL NEGATIVE REPORT   Report Status PENDING   Incomplete  CULTURE, BLOOD (ROUTINE X 2)     Status: None   Collection Time    04/29/13  2:51 PM      Result Value Range Status   Specimen Description Blood   Final   Special Requests Normal   Final   Culture  Setup Time 04/30/2013 01:17   Final   Culture     Final   Value:        BLOOD CULTURE RECEIVED NO GROWTH TO DATE CULTURE WILL BE HELD FOR 5 DAYS BEFORE ISSUING A  FINAL NEGATIVE REPORT   Report Status PENDING   Incomplete    Studies/Results: Dg Chest 2 View  04/30/2013   *RADIOLOGY REPORT*  Clinical Data: Short of breath, cough for 2 weeks, former smoking history  CHEST - 2 VIEW  Comparison: Portable chest x-ray of 04/29/2013  Findings: The lungs appear slightly better aerated. There is minimal patchy opacity medially at the right lung base and follow- up is recommended to exclude developing pneumonia.  Some of this opacity is due to overlapping calcified costochondral junction. Mediastinal contours are stable.  The heart is within upper limits of normal.  IMPRESSION: Minimal opacity at the right lung base medially may be due to overlapping calcified costochondral junction but a patchy pneumonia cannot be excluded.  Consider follow-up.   Original Report Authenticated By: Dwyane Dee, M.D.   Ct Angio Chest Pe W/cm &/or Wo Cm  04/30/2013   *RADIOLOGY REPORT*  Clinical Data: Positive D-dimer.  Hypoxemia.  CT ANGIOGRAPHY CHEST  Technique:  Multidetector CT imaging of the chest using the standard protocol during bolus administration of intravenous contrast. Multiplanar reconstructed images including MIPs were obtained and reviewed to evaluate the vascular anatomy.  Contrast: OMNIPAQUE IOHEXOL 350 MG/ML SOLN  Comparison: Chest x-ray 05/29/1913.  CT 08/06/2009  Findings: No filling defects in the pulmonary arteries to suggest pulmonary emboli.  There is consolidation in the right lower lobe, most pronounced posteriorly and medially most compatible with pneumonia.  Associated borderline sized right hilar and mediastinal/subcarinal lymph nodes.  These are presumably reactive.  Heart is normal size. Aorta is normal caliber.  Dense coronary artery calcifications diffusely.  No axillary adenopathy. Visualized thyroid and chest wall soft tissues unremarkable.  Minimal patchy densities are also seen in the posterior left upper lobe and the left lower lobe.  These also could  reflect early areas of pneumonia.  Mild COPD changes.  No pleural effusions.  Imaging into the upper abdomen shows no acute findings.  IMPRESSION: No evidence of pulmonary embolus.  Consolidation in the right lower lobe.  Minimal patchy densities in the posterior left upper lobe and left lower lobe.  Findings  concerning for pneumonia.  Reactive hilar and mediastinal lymph nodes.  Dense coronary artery calcifications.   Original Report Authenticated By: Charlett Nose, M.D.     Assessment/Plan: Fever  Returned traveler  Will add quinolone to cover atypical organisms Check legionella urine Ag Daughter concerened about fungus, will send serologies Sputum studies (routine, fungal, AFB) His Malaria smear is pending, not sent yesterday as lab was unclear about his other tests.  TEE negative, BCx are ngtd  Total days of antibiotics: 4 (zosyn)        [name] Infectious Diseases (pager) [phone] [email or web address] 05/01/2013, 3:31 PM   LOS: 3 days   Addendum I reviewed the pt's blood smear and did not see maliarial forms. This will be reviewed by micro as well.

## 2013-05-01 NOTE — Progress Notes (Signed)
*  PRELIMINARY RESULTS* Vascular Ultrasound Lower extremity venous duplex has been completed.  Preliminary findings: negative for DVT.    Farrel Demark, RDMS, RVT  05/01/2013, 9:44 AM

## 2013-05-01 NOTE — CV Procedure (Signed)
    Transesophageal Echocardiogram Note  Jeremy Stone [id number] Nov 18, 1925  Procedure: Transesophageal Echocardiogram Indications: fever, evaluate for endocarditis  Procedure Details Consent: Obtained Time Out: Verified patient identification, verified procedure, site/side was marked, verified correct patient position, special equipment/implants available, Radiology Safety Procedures followed,  medications/allergies/relevent history reviewed, required imaging and test results available.  Performed  Medications: Fentanyl: 37.5 mcg IV Versed: 5 mg IV  Left Ventrical:  Normal LV function  Mitral Valve: normal, no vegetations  Aortic Valve: mild AI, calcified AV. No vegetation  Tricuspid Valve: no vegetation  Pulmonic Valve: normal  Left Atrium/ Left atrial appendage: no thrombi  Atrial septum: intact  Aorta: mild calcification   Complications: No apparent complications Patient did tolerate procedure well.   Vesta Mixer, Jeremy Stone., MD, [facility] 05/01/2013, 1:37 PM

## 2013-05-01 NOTE — Progress Notes (Signed)
ANTIBIOTIC CONSULT NOTE - Follow Up  Pharmacy Consult for Zosyn  Indication: fever  Allergies  Allergen Reactions  . Lactose Intolerance (Gi)     Patient Measurements: Height: 5\' 7"  (170.2 cm) Weight: 204 lb (92.534 kg) IBW/kg (Calculated) : 66.1   Vital Signs: Temp: 99.5 F (37.5 C) (07/10 0550) Temp src: Axillary (07/10 0550) BP: 131/61 mmHg (07/10 0550) Pulse Rate: 91 (07/10 0550) Intake/Output from previous day: 07/09 0701 - 07/10 0700 In: 1228 [P.O.:480; I.V.:748] Out: 425 [Urine:425] Intake/Output from this shift:    Labs:  Recent Labs  04/28/13 1320 04/29/13 0346  WBC 10.2 10.5  HGB 13.5 13.1  PLT 223 243  CREATININE 1.19 1.31   Estimated Creatinine Clearance: 44.7 ml/min (by C-G formula based on Cr of 1.31).   Microbiology: Recent Results (from the past 720 hour(s))  CULTURE, BLOOD (ROUTINE X 2)     Status: None   Collection Time    04/28/13  1:05 PM      Result Value Range Status   Specimen Description BLOOD LEFT HAND   Final   Special Requests Normal   Final   Culture  Setup Time 04/28/2013 22:49   Final   Culture     Final   Value:        BLOOD CULTURE RECEIVED NO GROWTH TO DATE CULTURE WILL BE HELD FOR 5 DAYS BEFORE ISSUING A FINAL NEGATIVE REPORT   Report Status PENDING   Incomplete  CULTURE, BLOOD (ROUTINE X 2)     Status: None   Collection Time    04/28/13  1:20 PM      Result Value Range Status   Specimen Description BLOOD LEFT HAND   Final   Special Requests BOTTLES DRAWN AEROBIC AND ANAEROBIC 10CC   Final   Culture  Setup Time 04/28/2013 22:49   Final   Culture     Final   Value:        BLOOD CULTURE RECEIVED NO GROWTH TO DATE CULTURE WILL BE HELD FOR 5 DAYS BEFORE ISSUING A FINAL NEGATIVE REPORT   Report Status PENDING   Incomplete    Medical History: Past Medical History  Diagnosis Date  . NIDDM (non-insulin dependent diabetes mellitus)   . Hyperlipidemia   . Thyroid nodule   . Hypertension   . Trigeminal neuralgia   .  GERD (gastroesophageal reflux disease)   . OSA (obstructive sleep apnea)     Medications:  Prescriptions prior to admission  Medication Sig Dispense Refill  . acetaminophen (TYLENOL) 500 MG tablet Take 500 mg by mouth every 6 (six) hours as needed for pain (fever).      Marland Kitchen amLODipine (NORVASC) 5 MG tablet Take 1 tablet (5 mg total) by mouth daily.  90 tablet  0  . amoxicillin (AMOXIL) 875 MG tablet Take 875 mg by mouth 2 (two) times daily. Started on 04-24-13 for 7 days.      Marland Kitchen aspirin 325 MG tablet Take 325 mg by mouth daily.        . cetirizine (ZYRTEC) 10 MG tablet Take 10 mg by mouth daily.      . cholecalciferol (VITAMIN D) 1000 UNITS tablet Take 1,000 Units by mouth daily.      . Coenzyme Q10 (CO Q-10) 100 MG CAPS Take by mouth daily.      . Cyanocobalamin (VITAMIN B 12 PO) Take by mouth daily.      Marland Kitchen dextromethorphan-guaiFENesin (MUCINEX DM) 30-600 MG per 12 hr tablet Take 1 tablet by  mouth every 12 (twelve) hours.      Marland Kitchen ibuprofen (ADVIL,MOTRIN) 200 MG tablet Take 200 mg by mouth every 6 (six) hours as needed for pain.      . simvastatin (ZOCOR) 20 MG tablet Take 1 tablet (20 mg total) by mouth at bedtime.  90 tablet  0  . triamcinolone (NASACORT) 55 MCG/ACT nasal inhaler Place 2 sprays into the nose daily.       Scheduled:  . albuterol  2.5 mg Nebulization QID  . amLODipine  5 mg Oral Daily  . aspirin  325 mg Oral Daily  . dextromethorphan-guaiFENesin  1 tablet Oral Q12H  . enoxaparin (LOVENOX) injection  40 mg Subcutaneous Daily  . feeding supplement  237 mL Oral BID BM  . fluticasone  1 spray Each Nare Daily  . insulin aspart  0-15 Units Subcutaneous TID WC  . loratadine  10 mg Oral Daily  . pantoprazole  40 mg Oral Daily  . piperacillin-tazobactam (ZOSYN)  IV  3.375 g Intravenous Q8H  . saccharomyces boulardii  250 mg Oral BID  . senna  1 tablet Oral BID  . simvastatin  20 mg Oral QHS  . traMADol  50 mg Oral Q6H   Infusions:  . sodium chloride 50 mL/hr (04/30/13 0320)    PRN: acetaminophen, acetaminophen, ondansetron (ZOFRAN) IV, ondansetron, zolpidem Anti-infectives   Start     Dose/Rate Route Frequency Ordered Stop   04/28/13 1400  piperacillin-tazobactam (ZOSYN) IVPB 3.375 g     3.375 g 12.5 mL/hr over 240 Minutes Intravenous 3 times per day 04/28/13 1238       Assessment: 79 yo M - recent travel to [place], no vaccines, no antimalarials. Admitted 7/7 with fever for several days and RLQ abdominal tenderness. Patient was been on amoxacillin x 5 days for URI diagnosed by PCP PTA  D4 Zosyn 3.375g IV q8h  Scr 1.36 (7/8), CrCl 45 ml/min  WBC wnl, left shift  Bcx 7/7 and 7/8 no growth to date  Seen by ID and workup in progress: malaria smear, resp virus panel, yellow fever, dengue and chickengunya serologies, leptospira, HIV  Goal of Therapy:  Zosyn per renal function   Plan:  1.) Continue Zosyn 3.375 grams IV q8h, extended infusion   Gwen Her PharmD  [phone] 05/01/2013 9:02 AM

## 2013-05-01 NOTE — Interval H&P Note (Signed)
History and Physical Interval Note:  05/01/2013 1:19 PM  Jeremy Stone  has presented today for surgery, with the diagnosis of R/O endocarditis  The various methods of treatment have been discussed with the patient and family. After consideration of risks, benefits and other options for treatment, the patient has consented to  Procedure(s) with comments: TRANSESOPHAGEAL ECHOCARDIOGRAM (TEE) (N/A) - Need to make sure [facility] is arranged. Notifed RN at [employer] to do this on 04-30-2013 as a surgical intervention .  The patient's history has been reviewed, patient examined, no change in status, stable for surgery.  I have reviewed the patient's chart and labs.  Questions were answered to the patient's satisfaction.    The team is worried about endocarditis.  The CT suggests infiltrates c/w pneumonia.  Will perform the TEE to help with assessment. Jeremy Stone.

## 2013-05-01 NOTE — H&P (View-Only) (Signed)
PM note - reviewed Dr. Moshe Cipro note and orders.  D-dimer this PM was positive at 2.4. CTangio: IMPRESSION:  No evidence of pulmonary embolus.  Consolidation in the right lower lobe. Minimal patchy densities in  the posterior left upper lobe and left lower lobe. Findings  concerning for pneumonia. Reactive hilar and mediastinal lymph  nodes.  Dense coronary artery calcifications.  Reviewed work-up in 2010: coags panels were negative!. CT angio chest was normal.  Gave patient result information.  For TEE tomorrow.

## 2013-05-02 ENCOUNTER — Encounter (HOSPITAL_COMMUNITY): Payer: Self-pay | Admitting: Cardiovascular Disease

## 2013-05-02 LAB — LEGIONELLA ANTIGEN, URINE: Legionella Antigen, Urine: NEGATIVE

## 2013-05-02 LAB — BASIC METABOLIC PANEL
BUN: 19 mg/dL (ref 6–23)
Calcium: 8.7 mg/dL (ref 8.4–10.5)
GFR calc Af Amer: 56 mL/min — ABNORMAL LOW (ref >90)
GFR calc non Af Amer: 48 mL/min — ABNORMAL LOW (ref >90)
Glucose, Bld: 137 mg/dL — ABNORMAL HIGH (ref 70–99)
Potassium: 3.4 mEq/L — ABNORMAL LOW (ref 3.5–5.1)

## 2013-05-02 LAB — CBC WITH DIFFERENTIAL/PLATELET
Basophils Relative: 0 % (ref 0–1)
Eosinophils Absolute: 0.2 10*3/uL (ref 0.0–0.7)
Eosinophils Relative: 2 % (ref 0–5)
MCH: 32.3 pg (ref 26.0–34.0)
MCHC: 35 g/dL (ref 30.0–36.0)
MCV: 92.3 fL (ref 78.0–100.0)
Monocytes Relative: 5 % (ref 3–12)
Neutrophils Relative %: 83 % — ABNORMAL HIGH (ref 43–77)
Platelets: 246 10*3/uL (ref 150–400)

## 2013-05-02 LAB — GLUCOSE, CAPILLARY: Glucose-Capillary: 137 mg/dL — ABNORMAL HIGH (ref 70–99)

## 2013-05-02 MED ORDER — LEVOFLOXACIN 500 MG PO TABS: 500.0000 mg | ORAL_TABLET | Freq: Every day | ORAL | Status: DC

## 2013-05-02 MED ORDER — ALBUTEROL SULFATE HFA 108 (90 BASE) MCG/ACT IN AERS: 2.0000 | INHALATION_SPRAY | Freq: Four times a day (QID) | RESPIRATORY_TRACT | Status: DC | PRN

## 2013-05-02 NOTE — Progress Notes (Signed)
05/02/13 1045 Reviewed discharge instructions with patient. Patient verbalized understanding of discharge instructions. Copy of discharge instructions given to patient. Prescriptions were electronically sent to pharmacy.

## 2013-05-02 NOTE — Discharge Summary (Signed)
NAMEMarland Kitchen  Jeremy Stone, Jeremy Stone NO.:  [id number]  MEDICAL RECORD NO.:  [id number]  LOCATION:  1304                         FACILITY:  [facility]  PHYSICIAN:  Rosalyn Gess. Norins, MD  DATE OF BIRTH:  1926-09-16  DATE OF ADMISSION:  04/28/2013 DATE OF DISCHARGE:  05/02/2013                              DISCHARGE SUMMARY   ADMITTING DIAGNOSIS:  Fever of unknown origin.  DISCHARGE DIAGNOSIS:  Fever of unknown.  CONSULTANTS:  Lacretia Leigh. Ninetta Lights, MD, for Infectious Disease.  PROCEDURES:  Imaging.   July 7th Chest x-ray, 2 view, day of admission, which showed new right lower lobe airspace disease concerning for pneumonia. Emphysema.  April 28, 2013, CT of the abdomen and pelvis, which showed progressive herniation of the proximal sigmoid colon into the left inguinal hernia. Sigmoid diverticulosis without evidence of infection.  Atherosclerosis without aneurysm.  Coronary artery disease is present.  Stable small hiatal hernia.  New right lower lobe airspace disease likely represents pneumonia.  Aspiration could be considered.  Chest x-ray, April 29, 2013, which showed interval resolution of right lower lobe infiltrate.  Lower lung volumes with no new worrisome focal or acute abnormality identified.  Chest x-ray April 30, 2013, which showed minimal opacity of the right lung base medially, may be due to overlapping calcified costochondral junction, but a patchy pneumonia cannot be excluded.  CT angio of the chest April 30, 2013, showed no evidence of pulmonary embolism.  Consolidation in the right lower lobe.  Minimal patchy densities in the posterior left upper lobe and left lower lobe. Findings concerning for pneumonia.  Reactive hilar and mediastinal lymph nodes noted.  Dense coronary artery calcifications noted.  Transthoracic echocardiogram, April 29, 2013, with ejection fraction of 65- 70%, wall motion was normal.  Doppler parameters consistent with grade 1 diastolic dysfunction.   Mitral valve with calcified anulus.  Pulmonary arteries with mildly elevated systolic pressures.  Transesophageal echo performed May 01, 2013, to rule out subacute bacterial endocarditis performed by Jeremy Stone.  Results were no vegetations were noted.  Normal LV function noted. Mild aortic insufficiency with a calcified aortic valve.  HISTORY OF THE PRESENT ILLNESS:  Jeremy Stone is an 79 year old gentleman presented on the day of admission to the office for fevers to 105 and productive cough.  Jeremy Stone been seen at the [facility] office of [place] 5 days prior to admission, diagnosed with an upper respiratory infection.  He was started on amoxicillin.  He continued to have persistent intermittent fevers to 105.  Home remedies brought no real relief except reducing fever somewhat.  The patient continued to have a harsh productive cough along with increased shortness of breath. He denied any hemoptysis.  On examination, he was found to have tenderness in the right lower quadrant.  Because of the patient's fevers, cough and abdominal tenderness, he was admitted to hospital for evaluation.  Please see the H and P for past medical history, family history, social history, and admission physical exam.  HOSPITAL COURSE:   1. ID/FUO: The patient was admitted to a regular medical bed.  He has had blood cultures x8 with no growth to dates.  He has had urine for streptococcal antigen which was  negative.  Urine for Legionella antigen pending.  Multiple serologies were ordered by Dr. Ninetta Lights including dengue fever, chikungunya fever, leptospirosis, these are all pending.  AFB from sputum smear is pending as well.  The patient continued to have spike intermittent fevers, but has had no fever for the last 24 hours.  Of note, the patient did have an elevated D-dimer at 2.4.  Followup CT angio was negative for pulmonary embolus.  The patient during his hospital stay did have several  episodes where he would develop mild hypoxemia with mottling.  He has been on oxygen but according to his wife who has been with him 24/7, he has been up and ambulating in the room with oxygen saturations of 94%.  He does not report any shortness of breath.  With the patient's fevers having broken with him feeling stronger and Better, with him having no respiratory distress, with Infectious Disease having no further tests or procedures to order, at this point the patient is to be discharged to home to continue oral antibiotics.   He will have close followup with office visit in 4 days.  The patient does know to call over the weekend if he has any problems, respiratory distress, persistent fevers.  DISCHARGE PHYSICAL EXAMINATION:  VITAL SIGNS:  Temperature was 99.1, blood pressure 146/73, heart rate was 100, respirations were 20, oxygen saturation was 95% on 2 L. GENERAL APPEARANCE:  This is an elderly Caucasian gentleman looking slightly younger than his stated age.  No acute distress. HEENT:  Conjunctivae and sclerae were clear. NECK:  Supple. CHEST:  With increased AP diameter. LUNGS:  The patient is moving air well.  There are rales at the left base.  I did not appreciate rales in other lung fields.  There is no wheezing.  There is no prolonged expiratory phase.  There is no increased work of breathing. CARDIOVASCULAR:  2+ radial pulses.  Precordium is quiet.  He has a regular rate and rhythm.  The patient has soft murmurs at the right sternal border, left sternal border and left lower sternal border. ABDOMEN:  Obese, soft.  No guarding or rebound was noted.  He does have minor tenderness to deep palpation in the right lower quadrant. Genitalia was normal. EXTREMITIES:  Without clubbing, cyanosis, or edema. NEUROLOGIC:  Nonfocal.  DISCHARGE MEDICATIONS:  The patient will resume his home medications Including: 1. Tylenol 500 mg 1-2 tablets every 6 hours as needed for fever, 2.  Norvasc 5 mg daily,  3. aspirin 325 mg daily,  4. Zyrtec 10 mg daily,  5. vitamin D 1000 units daily,  6. CoQ10 100 mg capsules 1 daily,  7. vitamin B12 p.o. daily, 8. cough syrup with guaifenesin and dextromethorphan to use daily every 4 hours as needed for cough, ibuprofen 200 mg q.6 h. as needed,  9. Levaquin 500 mg p.o. daily,  10.simvastatin 20 mg daily,  11.Nasacort nasal inhaler 2 sprays to each nostril daily, 12.albuterol metered-dose inhaler (ProAir) 2 puffs q.6 h.  DISPOSITION:  The patient is discharged to home.  He will have close followup with the appointment on coming Tuesday.  The patient and his wife were advised to call for any significant problems.  For recurrent mottling as persistent or increasing shortness of breath, he is to be brought to the emergency department. The patient's condition at the time of discharge dictation is improved, somewhat guarded given his age.     Rosalyn Gess Norins, MD     MEN/MEDQ  D:  05/02/2013  T:  05/02/2013  Job:  [id number]  cc:   Lacretia Leigh. Ninetta Lights, M.D. Fax: [phone]

## 2013-05-02 NOTE — Progress Notes (Signed)
Subjective:  Had a good night. Feels better. No fever 24 hrs  Objective: Lab:  Recent Labs  05/02/13 0336  WBC 10.2  NEUTROABS 8.4*  HGB 12.1*  HCT 34.6*  MCV 92.3  PLT 246    Recent Labs  05/02/13 0336  NA 135  K 3.4*  CL 100  GLUCOSE 137*  BUN 19  CREATININE 1.31  CALCIUM 8.7    Imaging:  Scheduled Meds: . albuterol  2.5 mg Nebulization QID  . amLODipine  5 mg Oral Daily  . aspirin  325 mg Oral Daily  . dextromethorphan-guaiFENesin  1 tablet Oral Q12H  . enoxaparin (LOVENOX) injection  40 mg Subcutaneous Daily  . feeding supplement  237 mL Oral BID BM  . fluticasone  1 spray Each Nare Daily  . insulin aspart  0-15 Units Subcutaneous TID WC  . levofloxacin  500 mg Oral Daily  . loratadine  10 mg Oral Daily  . pantoprazole  40 mg Oral Daily  . piperacillin-tazobactam (ZOSYN)  IV  3.375 g Intravenous Q8H  . saccharomyces boulardii  250 mg Oral BID  . senna  1 tablet Oral BID  . simvastatin  20 mg Oral QHS  . traMADol  50 mg Oral Q6H   Continuous Infusions: . sodium chloride 50 mL/hr (05/02/13 0226)  . sodium chloride 500 mL (05/01/13 1153)   PRN Meds:.acetaminophen, acetaminophen, haloperidol, haloperidol lactate, ondansetron (ZOFRAN) IV, ondansetron, zolpidem   Physical Exam: Filed Vitals:   05/02/13 0900  BP: 146/73  Pulse: 100  Temp:   Resp: 20   See d/c/ summary     Assessment/Plan: 1. FUO - no organism identified. Most likely etiology pneumonia unknown organism, possibly legionella.  For d/c home Dictation # [id number] (priority)   [facility] (o) [phone]; (c) [phone] Call-grp - [facility]  Tele: [phone]  05/02/2013, 9:23 AM

## 2013-05-02 NOTE — Progress Notes (Signed)
05/02/13 0745 Called lab this morning lab tech states the yellow fever, dengue fever, and chikungunya labs were sent to the state laboratory this morning.

## 2013-05-04 ENCOUNTER — Other Ambulatory Visit: Payer: Self-pay | Admitting: Pulmonary Disease

## 2013-05-04 LAB — CULTURE, RESPIRATORY W GRAM STAIN: Culture: NORMAL

## 2013-05-04 LAB — CULTURE, BLOOD (ROUTINE X 2)
Culture: NO GROWTH
Culture: NO GROWTH
Special Requests: NORMAL

## 2013-05-05 LAB — MALARIA SMEAR: Special Requests: NORMAL

## 2013-05-06 ENCOUNTER — Ambulatory Visit (INDEPENDENT_AMBULATORY_CARE_PROVIDER_SITE_OTHER): Payer: Medicare PPO | Admitting: Internal Medicine

## 2013-05-06 ENCOUNTER — Encounter: Payer: Self-pay | Admitting: Internal Medicine

## 2013-05-06 VITALS — BP 132/80 | HR 106 | Temp 96.6°F | Wt 202.8 lb

## 2013-05-06 DIAGNOSIS — R509 Fever, unspecified: Secondary | ICD-10-CM

## 2013-05-06 DIAGNOSIS — J189 Pneumonia, unspecified organism: Secondary | ICD-10-CM

## 2013-05-06 LAB — CULTURE, BLOOD (ROUTINE X 2): Culture: NO GROWTH

## 2013-05-06 LAB — FUNGAL ANTIBODIES PANEL, ID-BLOOD: Aspergillus Flavus Antibodies: NEGATIVE

## 2013-05-06 MED ORDER — BENZONATATE 100 MG PO CAPS: 100.0000 mg | ORAL_CAPSULE | Freq: Three times a day (TID) | ORAL | Status: DC | PRN

## 2013-05-06 NOTE — Patient Instructions (Addendum)
FUO, most likely, when all is said and done, legionella. You look 1,000% better. It will take several weeks for you to complete recover, you were SAS.  Plan Finish levaqiun  Cough syrup does help, especially the DM  Tessalon perles - for cyclical tracheal irritative cough, 3 times a day.  Stove top vaporizer as needed.  Will let you know when the exotic serologies return.

## 2013-05-06 NOTE — Progress Notes (Signed)
Subjective:    Patient ID: Jeremy Stone, male    DOB: [birth year], 79 y.o.   MRN: [id number]  HPI Jeremy Stone was hospitalized July 7 - May 02, 2013 for Fever of unknown origin. ID consultant was Dr. Ninetta Lights. His hospital course was complicated by fever related delirium but he never became septic, was ruled out for PE by CT Angio, ruled out for SBE by TEE and all cultures were negative or pending. He did have radiographic evidence of pneumonia best appreciated by CT of the chest with variable infiltrates on plain films. He was treated pre-hospital with amoxicillin, 4 days of IV Zosyn while in-patient and was started on levaquin day prior to discharge for possible atypical pneumonia, e.g. Legionella.  Since d/c he has been afebrile, his appetite has improved, his cough has diminished and is now more of an irritation. He is still weak with low stamina and he is hoarse. He has not had diarrhea, nausea or vomiting. He is finishing his course of levaquin, is taking a probiotic and has resumed all his routine medications. He looks much better today. He did fall out of bed and had a minor abrasion to the vertex scalp and the right forearm  PMH, FamHx and SocHx reviewed for any changes and relevance.  Current Outpatient Prescriptions on File Prior to Visit  Medication Sig Dispense Refill  . acetaminophen (TYLENOL) 500 MG tablet Take 500 mg by mouth every 6 (six) hours as needed for pain (fever).      Marland Kitchen albuterol (PROVENTIL HFA;VENTOLIN HFA) 108 (90 BASE) MCG/ACT inhaler Inhale 2 puffs into the lungs every 6 (six) hours as needed for wheezing.  1 Inhaler  2  . amLODipine (NORVASC) 5 MG tablet Take 1 tablet (5 mg total) by mouth daily.  90 tablet  0  . aspirin 325 MG tablet Take 325 mg by mouth daily.        . cetirizine (ZYRTEC) 10 MG tablet Take 10 mg by mouth daily.      . cholecalciferol (VITAMIN D) 1000 UNITS tablet Take 1,000 Units by mouth daily.      . Coenzyme Q10 (CO Q-10) 100 MG CAPS  Take by mouth daily.      . Cyanocobalamin (VITAMIN B 12 PO) Take by mouth daily.      Marland Kitchen dextromethorphan-guaiFENesin (MUCINEX DM) 30-600 MG per 12 hr tablet Take 1 tablet by mouth every 12 (twelve) hours.      Marland Kitchen ibuprofen (ADVIL,MOTRIN) 200 MG tablet Take 200 mg by mouth every 6 (six) hours as needed for pain.      Marland Kitchen levofloxacin (LEVAQUIN) 500 MG tablet Take 1 tablet (500 mg total) by mouth daily.  7 tablet  0  . simvastatin (ZOCOR) 20 MG tablet Take 1 tablet (20 mg total) by mouth at bedtime.  90 tablet  0  . triamcinolone (NASACORT) 55 MCG/ACT nasal inhaler Place 2 sprays into the nose daily.       No current facility-administered medications on file prior to visit.      Review of Systems Constitutional:  Negative for fever, chills since hospital d/c. Decreased  activity but getting stronger and 10 lbs related weight change.  HEENT:  Negative for hearing loss, ear pain, congestion, neck stiffness and postnasal drip. Negative for sore throat or swallowing problems. Negative for dental complaints.   Eyes: Negative for vision loss or change in visual acuity.  Respiratory: Negative for chest tightness and wheezing. Negative for DOE.   Cardiovascular: Negative for  chest pain or palpitations. Positive for decreased exercise tolerance Gastrointestinal: No change in bowel habit. No bloating or gas. No reflux or indigestion Genitourinary: Negative for urgency, frequency, flank pain and difficulty urinating.  Musculoskeletal: Negative for myalgias, back pain, arthralgias and gait problem.  Neurological: Negative for dizziness, tremors, weakness and headaches.  Hematological: Negative for adenopathy.  Psychiatric/Behavioral: Negative for behavioral problems and dysphoric mood.       Objective:   Physical Exam Filed Vitals:   05/06/13 1048  BP: 132/80  Pulse: 106  Temp: 96.6 F (35.9 C)   Wt Readings from Last 3 Encounters:  05/06/13 202 lb 12.8 oz (91.989 kg)  05/01/13 204 lb (92.534  kg)  05/01/13 204 lb (92.534 kg)  04/02/13          216  Gen'l - more mature, overweight white man in no distress and more like himself HEENT- C&S clear. Bandaid on vertex scalp Neck - supple Cor 2+ radial, RRR Pulm - no increased work of breathing, no rales or wheezes Abd - BS+ Neuro - A&O x 3, normal get up and go, normal gait.       Assessment & Plan:  1. ID - FUO with working diagnosis of atypical pneumonia. Exotic cultures (dengue, leptospirosis, etc) pending. He has made good progress and is better.  Plan Finish levaquin  Continue probiotic   Convalesce - it should take several weeks to get back to full steam    2. Cyclical cough - he continues to have a non-productive cough which he describes as a tickle most likely related to tracheal irritation  Plan Add tessalon perles tid to his regimen.

## 2013-05-12 LAB — MISCELLANEOUS TEST: Miscellaneous Test: 87244

## 2013-05-13 LAB — MISCELLANEOUS TEST: Miscellaneous Test: 82064

## 2013-05-20 LAB — MISCELLANEOUS TEST

## 2013-05-27 ENCOUNTER — Other Ambulatory Visit: Payer: Self-pay | Admitting: Internal Medicine

## 2013-05-27 ENCOUNTER — Other Ambulatory Visit: Payer: Self-pay

## 2013-05-27 MED ORDER — AMLODIPINE BESYLATE 5 MG PO TABS: 5.0000 mg | ORAL_TABLET | Freq: Every day | ORAL | Status: DC

## 2013-05-28 ENCOUNTER — Other Ambulatory Visit: Payer: Self-pay

## 2013-05-30 ENCOUNTER — Ambulatory Visit (INDEPENDENT_AMBULATORY_CARE_PROVIDER_SITE_OTHER): Payer: Medicare PPO

## 2013-05-30 DIAGNOSIS — Z23 Encounter for immunization: Secondary | ICD-10-CM

## 2013-05-31 DIAGNOSIS — R509 Fever, unspecified: Secondary | ICD-10-CM

## 2013-05-31 DIAGNOSIS — J189 Pneumonia, unspecified organism: Secondary | ICD-10-CM

## 2013-06-01 LAB — CULTURE, FUNGUS WITHOUT SMEAR: Special Requests: NORMAL

## 2013-07-25 ENCOUNTER — Encounter: Payer: Self-pay | Admitting: Internal Medicine

## 2013-07-30 ENCOUNTER — Ambulatory Visit (INDEPENDENT_AMBULATORY_CARE_PROVIDER_SITE_OTHER): Payer: Medicare PPO

## 2013-07-30 DIAGNOSIS — Z23 Encounter for immunization: Secondary | ICD-10-CM

## 2013-08-22 ENCOUNTER — Telehealth: Payer: Self-pay

## 2013-08-22 NOTE — Telephone Encounter (Signed)
The patient is hoping to get a new pneumonia vaccine, and wanted to double check and make sure this was okay with Dr.Norins before scheduling.   Callback - [phone]  Thanks!  

## 2013-08-23 NOTE — Telephone Encounter (Signed)
Had pneumovax, polysaccharide vaccine August '14 - cannot have Prevnar until August '15

## 2013-08-28 ENCOUNTER — Ambulatory Visit (INDEPENDENT_AMBULATORY_CARE_PROVIDER_SITE_OTHER): Payer: Medicare PPO | Admitting: Internal Medicine

## 2013-08-28 ENCOUNTER — Encounter: Payer: Self-pay | Admitting: Internal Medicine

## 2013-08-28 VITALS — BP 150/84 | HR 75 | Temp 97.8°F | Wt 210.0 lb

## 2013-08-28 DIAGNOSIS — M25512 Pain in left shoulder: Secondary | ICD-10-CM

## 2013-08-28 DIAGNOSIS — M791 Myalgia, unspecified site: Secondary | ICD-10-CM

## 2013-08-28 DIAGNOSIS — M25519 Pain in unspecified shoulder: Secondary | ICD-10-CM

## 2013-08-28 DIAGNOSIS — M6789 Other specified disorders of synovium and tendon, multiple sites: Secondary | ICD-10-CM

## 2013-08-28 NOTE — Progress Notes (Signed)
Pre visit review using our clinic review tool, if applicable. No additional management support is needed unless otherwise documented below in the visit note. 

## 2013-08-28 NOTE — Progress Notes (Signed)
Subjective:    Patient ID: Jeremy Stone, male    DOB: March 26, 1926, 79 y.o.   MRN: [id number]  HPI Jeremy Stone presents for pain in the left shoulder for a couple of months. It  Seems to getting worse with pain worse at the coracoid process, but it radiates to the back. He has pain in all movements. He has audible crepitus. He does not recall any trauma. He does have other painful joints: knees, right shoulder, back.  Past Medical History  Diagnosis Date  . NIDDM (non-insulin dependent diabetes mellitus)   . Hyperlipidemia   . Thyroid nodule   . Hypertension   . Trigeminal neuralgia   . GERD (gastroesophageal reflux disease)   . OSA (obstructive sleep apnea)     uses CPAP   Past Surgical History  Procedure Laterality Date  . Appendectomy    . Trigeminal surgery      07-22-07- [facility]  . Tee without cardioversion N/A 05/01/2013    Procedure: TRANSESOPHAGEAL ECHOCARDIOGRAM (TEE);  Surgeon: Vesta Mixer, MD;  Location: [facility] ENDOSCOPY;  Service: Cardiovascular;  Laterality: N/A;  Need to make sure [facility] is arranged. Notifed RN at Jeremy Stone to do this on 04-30-2013   Family History  Problem Relation Age of Onset  . Dementia Sister    History   Social History  . Marital Status: Married    Spouse Name: N/A    Number of Children: N/A  . Years of Education: 16   Occupational History  . businessman    Social History Main Topics  . Smoking status: Former Smoker -- 1.00 packs/day for 20 years    Types: Cigarettes    Quit date: 10/23/1982  . Smokeless tobacco: Never Used  . Alcohol Use: 5.0 oz/week    10 drink(s) per week     Comment: wine with dinner  . Drug Use: No  . Sexual Activity: Yes    Partners: Female   Other Topics Concern  . 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 Married  And divorced - 2 children: 1 son - Engineer, petroleum now in [place]; 1 dtr , several grandhchildren. 2nd marriage - doing well.      Current Outpatient Prescriptions on File Prior to Visit  Medication Sig Dispense Refill  . acetaminophen (TYLENOL) 500 MG tablet Take 500 mg by mouth every 6 (six) hours as needed for pain (fever).      Marland Kitchen albuterol (PROVENTIL HFA;VENTOLIN HFA) 108 (90 BASE) MCG/ACT inhaler Inhale 2 puffs into the lungs every 6 (six) hours as needed for wheezing.  1 Inhaler  2  . amLODipine (NORVASC) 5 MG tablet Take 1 tablet (5 mg total) by mouth daily.  90 tablet  3  . aspirin 325 MG tablet Take 325 mg by mouth daily.        . benzonatate (TESSALON) 100 MG capsule Take 1 capsule (100 mg total) by mouth 3 (three) times daily as needed for cough.  30 capsule  1  . cetirizine (ZYRTEC) 10 MG tablet Take 10 mg by mouth daily.      . cholecalciferol (VITAMIN D) 1000 UNITS tablet Take 1,000 Units by mouth daily.      . Coenzyme Q10 (CO Q-10) 100 MG CAPS Take by mouth daily.      . Cyanocobalamin (VITAMIN B 12 PO) Take by mouth daily.      Marland Kitchen dextromethorphan-guaiFENesin (MUCINEX DM) 30-600 MG per 12 hr tablet Take 1  tablet by mouth every 12 (twelve) hours.      Marland Kitchen ibuprofen (ADVIL,MOTRIN) 200 MG tablet Take 200 mg by mouth every 6 (six) hours as needed for pain.      Marland Kitchen levofloxacin (LEVAQUIN) 500 MG tablet Take 1 tablet (500 mg total) by mouth daily.  7 tablet  0  . metFORMIN (GLUCOPHAGE) 500 MG tablet TAKE 1 TABLET TWICE DAILY  WITH  MEALS  180 tablet  2  . simvastatin (ZOCOR) 20 MG tablet TAKE 1 TABLET AT BEDTIME  90 tablet  2  . triamcinolone (NASACORT) 55 MCG/ACT nasal inhaler Place 2 sprays into the nose daily.       No current facility-administered medications on file prior to visit.       Review of Systems System review is negative for any constitutional, cardiac, pulmonary, GI or neuro symptoms or complaints other than as described in the HPI.     Objective:   Physical Exam Filed Vitals:   08/28/13 1649  BP: 150/84  Pulse: 75  Temp: 97.8 F (36.6 C)   Wt Readings from Last 3 Encounters:   08/28/13 210 lb (95.255 kg)  05/06/13 202 lb 12.8 oz (91.989 kg)  05/01/13 204 lb (92.534 kg)   Gen'l - WNWD overweight well preserved older man in no distress Cor- 2+ radial, RRR Pulm - normal respirations MSK - very tender at the tendinis insertion of the short head of the left biceps at the coracoid process. Tenderness with passive ROM left shoulder with tenderness to palpation  Procedure Tendon injection- insertion of short head biceps tendon left.  Indication - localized pain Consent - informed verbal consent from patient after explanation of risks of bleeding and infection Prep - injection site identified, prepped with betadine followed by alcohol. Med -  20  Mg depomedrol with 0.5 Cc 2% xylocain Injection - tendon space entered easily. Injected without difficulty. Patient tolerated this well. Post-procedure - patient with rapid reduction in discomfort. Routine precautions provided including instruction to return for fever, drainage or increased pain  Procedure Joint/bursal injection - left shoulder, lateral approach  Indication - localized pain Consent - informed verbal consent from patient after explanation of risks of bleeding and infection Prep - injection site identified, prepped with betadine followed by alcohol. Med -  60 Mg depomedrol with 1 Cc 2% xylocain Injection - bursa/joint space entered easily. Injected without difficulty. Patient tolerated this well. Post-procedure - patient with rapid reduction in discomfort. Bandaid applied. Routine precautions provided including instruction to return for fever, drainage or increased pain         Assessment & Plan:  1. Shoulder pain - arthritis vs bursitis. No click or crepitus with passive ROM left shoulder. Improved with depomedrol/xylocain injection Plan Gentle ROM  APAP or NSAID as needed.  2. Tendonitis - insertion short head of biceps: point tenderness on exam. Improved with depomedrol/xylocain  injection. Plan Gentle ROM

## 2013-08-29 MED ORDER — METHYLPREDNISOLONE ACETATE 40 MG/ML IJ SUSP
60.0000 mg | Freq: Once | INTRAMUSCULAR | Status: AC
  Administered 2013-08-28: 60 mg via INTRA_ARTICULAR

## 2013-08-29 MED ORDER — METHYLPREDNISOLONE ACETATE 40 MG/ML IJ SUSP
20.0000 mg | Freq: Once | INTRAMUSCULAR | Status: AC
  Administered 2013-08-28: 20 mg via INTRALESIONAL

## 2013-10-21 ENCOUNTER — Encounter: Payer: Self-pay | Admitting: Internal Medicine

## 2013-10-21 ENCOUNTER — Ambulatory Visit (INDEPENDENT_AMBULATORY_CARE_PROVIDER_SITE_OTHER): Payer: Medicare PPO | Admitting: Internal Medicine

## 2013-10-21 VITALS — BP 158/98 | HR 73 | Temp 96.9°F | Wt 206.8 lb

## 2013-10-21 DIAGNOSIS — J310 Chronic rhinitis: Secondary | ICD-10-CM

## 2013-10-21 MED ORDER — FLUTICASONE PROPIONATE 50 MCG/ACT NA SUSP: 2.0000 | Freq: Every day | NASAL | Status: AC

## 2013-10-21 NOTE — Patient Instructions (Signed)
Sleep duration problems - seems to be related to poor air movment through your nose as evidence by your improvement with Dristan - nasal decongestant or mentholated inhaler which also shrinks the mucus membranes. This is called vaso-motor rhinitis.  Plan OK to continue Dristan or the nasal inhaler  Start Fluticasone nasal spray - an inhalational steroid for long term management of this problem. It takes 3-5 days of use to really effectively open up the passage way(s).  

## 2013-10-21 NOTE — Progress Notes (Signed)
Subjective:    Patient ID: Jeremy Stone, male    DOB: 1926-07-05, 79 y.o.   MRN: [id number]  HPI Jeremy Stone presents for sleep duration insomnia. He does have OSA and does have very dry mouth. He did use Dristan nasal spray with good relief. Tried nasal inhaler also with good results. He has not previously been diagnosed with allergies. With the use of decongestants he has been able to get 8-9 hours of sleep.   Past Medical History  Diagnosis Date  . NIDDM (non-insulin dependent diabetes mellitus)   . Hyperlipidemia   . Thyroid nodule   . Hypertension   . Trigeminal neuralgia   . GERD (gastroesophageal reflux disease)   . OSA (obstructive sleep apnea)     uses CPAP   Past Surgical History  Procedure Laterality Date  . Appendectomy    . Trigeminal surgery      07-22-07- [facility]  . Tee without cardioversion N/A 05/01/2013    Procedure: TRANSESOPHAGEAL ECHOCARDIOGRAM (TEE);  Surgeon: Vesta Mixer, MD;  Location: [facility] ENDOSCOPY;  Service: Cardiovascular;  Laterality: N/A;  Need to make sure Carlink is arranged. Notifed RN at [employer] to do this on 04-30-2013   Family History  Problem Relation Age of Onset  . Dementia Sister    History   Social History  . Marital Status: Married    Spouse Name: N/A    Number of Children: N/A  . Years of Education: 16   Occupational History  . businessman    Social History Main Topics  . Smoking status: Former Smoker -- 1.00 packs/day for 20 years    Types: Cigarettes    Quit date: 10/23/1982  . Smokeless tobacco: Never Used  . Alcohol Use: 5.0 oz/week    10 drink(s) per week     Comment: wine with dinner  . Drug Use: No  . Sexual Activity: Yes    Partners: Female   Other Topics Concern  . 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 Married  And divorced - 2 children: 1 son - Engineer, petroleum now in [place]; 1 dtr , several grandhchildren. 2nd marriage - doing well.     Current  Outpatient Prescriptions on File Prior to Visit  Medication Sig Dispense Refill  . acetaminophen (TYLENOL) 500 MG tablet Take 500 mg by mouth every 6 (six) hours as needed for pain (fever).      Marland Kitchen amLODipine (NORVASC) 5 MG tablet Take 1 tablet (5 mg total) by mouth daily.  90 tablet  3  . aspirin 325 MG tablet Take 325 mg by mouth daily.        . cholecalciferol (VITAMIN D) 1000 UNITS tablet Take 1,000 Units by mouth daily.      . Coenzyme Q10 (CO Q-10) 100 MG CAPS Take by mouth daily.      . Cyanocobalamin (VITAMIN B 12 PO) Take by mouth daily.      Marland Kitchen ibuprofen (ADVIL,MOTRIN) 200 MG tablet Take 200 mg by mouth every 6 (six) hours as needed for pain.      . metFORMIN (GLUCOPHAGE) 500 MG tablet TAKE 1 TABLET TWICE DAILY  WITH  MEALS  180 tablet  2  . simvastatin (ZOCOR) 20 MG tablet TAKE 1 TABLET AT BEDTIME  90 tablet  2   No current facility-administered medications on file prior to visit.      Review of Systems System review is negative for any constitutional, cardiac, pulmonary,  GI or neuro symptoms or complaints other than as described in the HPI.     Objective:   Physical Exam Filed Vitals:   10/21/13 1705  BP: 158/98  Pulse: 73  Temp: 96.9 F (36.1 C)   Gen'l- overweight older man in no distress HEENT - no sinus tenderness Cor- RRR Pulm - normal respirations.       Assessment & Plan:

## 2013-10-21 NOTE — Progress Notes (Signed)
Pre visit review using our clinic review tool, if applicable. No additional management support is needed unless otherwise documented below in the visit note. 

## 2013-10-23 DIAGNOSIS — J310 Chronic rhinitis: Secondary | ICD-10-CM | POA: Insufficient documentation

## 2013-10-23 NOTE — Assessment & Plan Note (Signed)
Sleep duration problems - seems to be related to poor air movment through your nose as evidence by your improvement with Dristan - nasal decongestant or mentholated inhaler which also shrinks the mucus membranes. This is called vaso-motor rhinitis.  Plan OK to continue Dristan or the nasal inhaler  Start Fluticasone nasal spray - an inhalational steroid for long term management of this problem. It takes 3-5 days of use to really effectively open up the passage way(s).

## 2013-11-03 ENCOUNTER — Other Ambulatory Visit: Payer: Self-pay

## 2013-11-03 MED ORDER — METFORMIN HCL 500 MG PO TABS: 500.0000 mg | ORAL_TABLET | Freq: Two times a day (BID) | ORAL | Status: AC

## 2013-11-03 MED ORDER — AMLODIPINE BESYLATE 5 MG PO TABS: 5.0000 mg | ORAL_TABLET | Freq: Every day | ORAL | Status: DC

## 2013-11-03 MED ORDER — SIMVASTATIN 20 MG PO TABS: 20.0000 mg | ORAL_TABLET | Freq: Every day | ORAL | Status: DC

## 2013-12-05 ENCOUNTER — Telehealth: Payer: Self-pay | Admitting: *Deleted

## 2013-12-05 NOTE — Telephone Encounter (Signed)
Patient phoned stating he needed his handicap placard renewed.  Please advise.

## 2013-12-08 NOTE — Telephone Encounter (Signed)
Please pull a placard form and put in my fax file.

## 2013-12-08 NOTE — Telephone Encounter (Signed)
Form printed and completed and patient notified.

## 2013-12-08 NOTE — Telephone Encounter (Signed)
Printed and partially filled out

## 2013-12-11 ENCOUNTER — Encounter: Payer: Self-pay | Admitting: Internal Medicine

## 2014-01-07 ENCOUNTER — Encounter: Payer: Self-pay | Admitting: Internal Medicine

## 2014-01-07 ENCOUNTER — Other Ambulatory Visit (INDEPENDENT_AMBULATORY_CARE_PROVIDER_SITE_OTHER): Payer: Medicare Other

## 2014-01-07 ENCOUNTER — Ambulatory Visit (INDEPENDENT_AMBULATORY_CARE_PROVIDER_SITE_OTHER): Payer: Medicare Other | Admitting: Internal Medicine

## 2014-01-07 VITALS — BP 154/86 | HR 81 | Temp 97.5°F | Wt 208.6 lb

## 2014-01-07 DIAGNOSIS — I10 Essential (primary) hypertension: Secondary | ICD-10-CM

## 2014-01-07 DIAGNOSIS — E119 Type 2 diabetes mellitus without complications: Secondary | ICD-10-CM

## 2014-01-07 DIAGNOSIS — E785 Hyperlipidemia, unspecified: Secondary | ICD-10-CM

## 2014-01-07 DIAGNOSIS — K117 Disturbances of salivary secretion: Secondary | ICD-10-CM

## 2014-01-07 DIAGNOSIS — G4733 Obstructive sleep apnea (adult) (pediatric): Secondary | ICD-10-CM

## 2014-01-07 LAB — BASIC METABOLIC PANEL
BUN: 22 mg/dL (ref 6–23)
CALCIUM: 9.2 mg/dL (ref 8.4–10.5)
CO2: 28 mEq/L (ref 19–32)
Chloride: 105 mEq/L (ref 96–112)
Creatinine, Ser: 1.2 mg/dL (ref 0.4–1.5)
GFR: 59.86 mL/min — AB (ref >60.00)
Glucose, Bld: 83 mg/dL (ref 70–99)
POTASSIUM: 3.9 meq/L (ref 3.5–5.1)
SODIUM: 138 meq/L (ref 135–145)

## 2014-01-07 LAB — HEMOGLOBIN A1C: HEMOGLOBIN A1C: 6.7 % — AB (ref 4.6–6.5)

## 2014-01-07 MED ORDER — PILOCARPINE HCL 5 MG PO TABS: 5.0000 mg | ORAL_TABLET | Freq: Two times a day (BID) | ORAL | Status: DC

## 2014-01-07 NOTE — Progress Notes (Signed)
Subjective:    Patient ID: Jeremy Stone, male    DOB: Oct 16, 1926, 79 y.o.   MRN: [id number]  HPI Jeremy Stone is feeling well but has some questions about his medications: does he need to continue simvastatin, metformin. He continues to have trouble sleeping due to xerostomia which is related in part to mouth breathing. When he wears his CPAP with tight fitting jaw straps he does better.  Past Medical History  Diagnosis Date  . NIDDM (non-insulin dependent diabetes mellitus)   . Hyperlipidemia   . Thyroid nodule   . Hypertension   . Trigeminal neuralgia   . GERD (gastroesophageal reflux disease)   . OSA (obstructive sleep apnea)     uses CPAP   Past Surgical History  Procedure Laterality Date  . Appendectomy    . Trigeminal surgery      07-22-07- [facility]  . Tee without cardioversion N/A 05/01/2013    Procedure: TRANSESOPHAGEAL ECHOCARDIOGRAM (TEE);  Surgeon: Thayer Headings, MD;  Location: [facility];  Service: Cardiovascular;  Laterality: N/A;  Need to make sure Carlink is arranged. Notifed RN at [employer] [facility] to do this on 04-30-2013   Family History  Problem Relation Age of Onset  . Dementia Sister    History   Social History  . Marital Status: Married    Spouse Name: N/A    Number of Children: N/A  . Years of Education: 16   Occupational History  . businessman    Social History Main Topics  . Smoking status: Former Smoker -- 1.00 packs/day for 20 years    Types: Cigarettes    Quit date: 10/23/1982  . Smokeless tobacco: Never Used  . Alcohol Use: 5.0 oz/week    10 drink(s) per week     Comment: wine with dinner  . Drug Use: No  . Sexual Activity: Yes    Partners: Female   Other Topics Concern  . 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 Married  And divorced - 2 children: 1 son - Psychiatric nurse now in [place]; 1 dtr , several grandhchildren. 2nd marriage - doing well.     Current Outpatient Prescriptions on  File Prior to Visit  Medication Sig Dispense Refill  . acetaminophen (TYLENOL) 500 MG tablet Take 500 mg by mouth every 6 (six) hours as needed for pain (fever).      Marland Kitchen amLODipine (NORVASC) 5 MG tablet Take 1 tablet (5 mg total) by mouth daily.  90 tablet  3  . aspirin 325 MG tablet Take 325 mg by mouth daily.        . cholecalciferol (VITAMIN D) 1000 UNITS tablet Take 1,000 Units by mouth daily.      . Coenzyme Q10 (CO Q-10) 100 MG CAPS Take by mouth daily.      . Cyanocobalamin (VITAMIN B 12 PO) Take by mouth daily.      . fluticasone (FLONASE) 50 MCG/ACT nasal spray Place 2 sprays into both nostrils daily.  16 g  6  . ibuprofen (ADVIL,MOTRIN) 200 MG tablet Take 200 mg by mouth every 6 (six) hours as needed for pain.      . metFORMIN (GLUCOPHAGE) 500 MG tablet Take 1 tablet (500 mg total) by mouth 2 (two) times daily with a meal.  180 tablet  3  . simvastatin (ZOCOR) 20 MG tablet Take 1 tablet (20 mg total) by mouth at bedtime.  90 tablet  3   No current facility-administered  medications on file prior to visit.      Review of Systems System review is negative for any constitutional, cardiac, pulmonary, GI or neuro symptoms or complaints other than as described in the HPI.     Objective:   Physical Exam Filed Vitals:   01/07/14 1321  BP: 154/86  Pulse: 81  Temp: 97.5 F (36.4 C)   Wt Readings from Last 3 Encounters:  01/07/14 208 lb 9.6 oz (94.62 kg)  10/21/13 206 lb 12.8 oz (93.804 kg)  08/28/13 210 lb (95.255 kg)   Gen'l- overweight man who looks younger than his years HEENT- C&S clear, oropharyn - mm do not appear dry Cor- 2+ radial Pul Normal respirations Neuro - A&O x 3, CN II-XII gorssly normal. Normal get up and go. Normal gait  Recent Results (from the past 2160 hour(s))  HEMOGLOBIN A1C     Status: Abnormal   Collection Time    01/07/14  2:15 PM      Result Value Ref Range   Hemoglobin A1C 6.7 (*) 4.6 - 6.5 %   Comment: Glycemic Control Guidelines for People  with Diabetes:Non Diabetic:  <6%Goal of Therapy: <7%Additional Action Suggested:  >4%   BASIC METABOLIC PANEL     Status: Abnormal   Collection Time    01/07/14  2:15 PM      Result Value Ref Range   Sodium 138  135 - 145 mEq/L   Potassium 3.9  3.5 - 5.1 mEq/L   Chloride 105  96 - 112 mEq/L   CO2 28  19 - 32 mEq/L   Glucose, Bld 83  70 - 99 mg/dL   BUN 22  6 - 23 mg/dL   Creatinine, Ser 1.2  0.4 - 1.5 mg/dL   Calcium 9.2  8.4 - 10.5 mg/dL   GFR 59.86 (*) >60.00 mL/min          Assessment & Plan:

## 2014-01-07 NOTE — Patient Instructions (Signed)
Good to see you.  Medication review: Simvastatin - for cholesterol - the overall life benefit for you is measured in days! It is ok to stop and manage with life style alone - low fat diet Metformin - my electronic record does go back far enough to know your pretreatment glucose level. However, it is a safe medicine and definitely has benefit, i.e. Controlling the blood sugar Amlodipine - your blood pressure is borderline on medication. We don't need to add medication but certainly you need to continue this. Flonase nasal - it will really help with the allergy season. Supplements - OK  Continued care - will contact Lavone Orn at [facility]. Further plans to be based on his response.   Dry mouth - mechanical problem-can't keep your mouth shut.  Plan Keep up to date with CPAP straps  Will try salagen twice a day

## 2014-01-07 NOTE — Progress Notes (Signed)
Pre visit review using our clinic review tool, if applicable. No additional management support is needed unless otherwise documented below in the visit note. 

## 2014-01-08 ENCOUNTER — Telehealth: Payer: Self-pay | Admitting: Internal Medicine

## 2014-01-08 ENCOUNTER — Encounter: Payer: Self-pay | Admitting: Internal Medicine

## 2014-01-08 MED ORDER — PILOCARPINE HCL 5 MG PO TABS: 5.0000 mg | ORAL_TABLET | Freq: Two times a day (BID) | ORAL | Status: DC

## 2014-01-08 NOTE — Assessment & Plan Note (Signed)
BP Readings from Last 3 Encounters:  01/07/14 154/86  10/21/13 158/98  08/28/13 150/84   BP running a little high.   Plan Monitor BP at home, if persistently elevated with SBP > 150 will add RAS medication

## 2014-01-08 NOTE — Telephone Encounter (Signed)
Relevant patient education assigned to patient using Emmi. ° °

## 2014-01-08 NOTE — Assessment & Plan Note (Signed)
Lab Results  Component Value Date   HGBA1C 6.7* 01/07/2014   Good control. Discussed the value of continued medical therapy and control.  Plan Continue metformin

## 2014-01-08 NOTE — Assessment & Plan Note (Signed)
Discussed the relative low value of tight cholesterol control in an 80 year old with no history of ASVD - mortality benefit at this point measured in days.  Plan D/c simvastatin  Prudent low fat diet.

## 2014-01-08 NOTE — Assessment & Plan Note (Signed)
Stable and using CPAP routinely. He does change straps as needed to insure good fit and jaw support.

## 2014-01-08 NOTE — Assessment & Plan Note (Signed)
Continued problem with mouth breathing at night and xerostomia  Plan Continue to use CPAP straps that help elevate mandible  Trial of salagen bid

## 2014-01-19 ENCOUNTER — Other Ambulatory Visit: Payer: Self-pay | Admitting: Dermatology

## 2014-02-01 ENCOUNTER — Emergency Department (HOSPITAL_COMMUNITY)
Admission: EM | Admit: 2014-02-01 | Discharge: 2014-02-01 | Disposition: A | Discharge status: Home / Self Care | Payer: Medicare Other | Source: Home / Self Care | Attending: Family Medicine | Admitting: Family Medicine

## 2014-02-01 ENCOUNTER — Emergency Department (INDEPENDENT_AMBULATORY_CARE_PROVIDER_SITE_OTHER): Payer: Medicare Other

## 2014-02-01 ENCOUNTER — Encounter (HOSPITAL_COMMUNITY): Payer: Self-pay | Admitting: Emergency Medicine

## 2014-02-01 DIAGNOSIS — R32 Unspecified urinary incontinence: Secondary | ICD-10-CM

## 2014-02-01 LAB — URINALYSIS, ROUTINE W REFLEX MICROSCOPIC
GLUCOSE, UA: 100 mg/dL — AB
Ketones, ur: 15 mg/dL — AB
Leukocytes, UA: NEGATIVE
Nitrite: NEGATIVE
PROTEIN: 100 mg/dL — AB
SPECIFIC GRAVITY, URINE: 1.025 (ref 1.005–1.030)
UROBILINOGEN UA: 0.2 mg/dL (ref 0.0–1.0)
pH: 6 (ref 5.0–8.0)

## 2014-02-01 LAB — POCT I-STAT, CHEM 8
BUN: 19 mg/dL (ref 6–23)
CREATININE: 1.3 mg/dL (ref 0.50–1.35)
Calcium, Ion: 1.14 mmol/L (ref 1.13–1.30)
Chloride: 101 mEq/L (ref 96–112)
GLUCOSE: 139 mg/dL — AB (ref 70–99)
HCT: 45 % (ref 39.0–52.0)
HEMOGLOBIN: 15.3 g/dL (ref 13.0–17.0)
POTASSIUM: 4 meq/L (ref 3.7–5.3)
SODIUM: 136 meq/L — AB (ref 137–147)
TCO2: 19 mmol/L (ref 0–100)

## 2014-02-01 LAB — POCT URINALYSIS DIP (DEVICE)
Bilirubin Urine: NEGATIVE
GLUCOSE, UA: NEGATIVE mg/dL
KETONES UR: 15 mg/dL — AB
LEUKOCYTES UA: NEGATIVE
Nitrite: NEGATIVE
Protein, ur: 100 mg/dL — AB
Urobilinogen, UA: 0.2 mg/dL (ref 0.0–1.0)
pH: 6.5 (ref 5.0–8.0)

## 2014-02-01 LAB — URINE MICROSCOPIC-ADD ON

## 2014-02-01 MED ORDER — TAMSULOSIN HCL 0.4 MG PO CAPS: 0.4000 mg | ORAL_CAPSULE | Freq: Every day | ORAL | Status: DC

## 2014-02-01 NOTE — Discharge Instructions (Signed)
Take medicine as prescribed and call urology office at 8am for follow-up appt on mon.

## 2014-02-01 NOTE — ED Notes (Signed)
Urine culture sample obtained from catheter.  Foley catheter D/C'd.  Pt tolerated well.

## 2014-02-01 NOTE — ED Notes (Signed)
16 fr Foley catheter inserted with 200cc clear yellow urine output.  Dr Juventino Slovak notified of results.  V.O. Remove Foley catheter.

## 2014-02-01 NOTE — ED Provider Notes (Signed)
CSN: [id number]     Arrival date & time 02/01/14  1314 History   First MD Initiated Contact with Patient 02/01/14 1507     Chief Complaint  Patient presents with  . Fever   (Consider location/radiation/quality/duration/timing/severity/associated sxs/prior Treatment) Patient is a 80 y.o. male presenting with fever. The history is provided by the patient and the spouse.  Fever Temp source:  Oral Severity:  Moderate Onset quality:  Sudden Duration:  2 days Progression:  Waxing and waning Chronicity:  New Associated symptoms: chills, confusion and nausea   Associated symptoms: no diarrhea, no rash and no vomiting   Associated symptoms comment:  Urinary freq and incont during night last eve.   Past Medical History  Diagnosis Date  . NIDDM (non-insulin dependent diabetes mellitus)   . Hyperlipidemia   . Thyroid nodule   . Hypertension   . Trigeminal neuralgia   . GERD (gastroesophageal reflux disease)   . OSA (obstructive sleep apnea)     uses CPAP   Past Surgical History  Procedure Laterality Date  . Appendectomy    . Trigeminal surgery      07-22-07- [facility]  . Tee without cardioversion N/A 05/01/2013    Procedure: TRANSESOPHAGEAL ECHOCARDIOGRAM (TEE);  Surgeon: Thayer Headings, MD;  Location: [facility];  Service: Cardiovascular;  Laterality: N/A;  Need to make sure Carlink is arranged. Notifed RN at [facility] to do this on 04-30-2013   Family History  Problem Relation Age of Onset  . Dementia Sister    History  Substance Use Topics  . Smoking status: Former Smoker -- 1.00 packs/day for 20 years    Types: Cigarettes    Quit date: 10/23/1982  . Smokeless tobacco: Never Used  . Alcohol Use: 5.0 oz/week    10 drink(s) per week     Comment: wine with dinner    Review of Systems  Constitutional: Positive for fever and chills.  HENT: Negative.   Respiratory: Negative.   Cardiovascular: Negative.   Gastrointestinal: Positive for nausea and constipation. Negative for vomiting,  abdominal pain and diarrhea.  Genitourinary: Positive for urgency, frequency and enuresis.  Skin: Negative for rash.  Psychiatric/Behavioral: Positive for confusion.    Allergies  Lactose intolerance (gi)  Home Medications   Current Outpatient Rx  Name  Route  Sig  Dispense  Refill  . acetaminophen (TYLENOL) 500 MG tablet   Oral   Take 500 mg by mouth every 6 (six) hours as needed for pain (fever).         Marland Kitchen amLODipine (NORVASC) 5 MG tablet   Oral   Take 1 tablet (5 mg total) by mouth daily.   90 tablet   3   . aspirin 325 MG tablet   Oral   Take 325 mg by mouth daily.           . cholecalciferol (VITAMIN D) 1000 UNITS tablet   Oral   Take 1,000 Units by mouth daily.         . Coenzyme Q10 (CO Q-10) 100 MG CAPS   Oral   Take by mouth daily.         . Cyanocobalamin (VITAMIN B 12 PO)   Oral   Take by mouth daily.         . fluticasone (FLONASE) 50 MCG/ACT nasal spray   Each Nare   Place 2 sprays into both nostrils daily.   16 g   6   . ibuprofen (ADVIL,MOTRIN) 200 MG tablet   Oral  Take 200 mg by mouth every 6 (six) hours as needed for pain.         . metFORMIN (GLUCOPHAGE) 500 MG tablet   Oral   Take 1 tablet (500 mg total) by mouth 2 (two) times daily with a meal.   180 tablet   3   . pilocarpine (SALAGEN) 5 MG tablet   Oral   Take 1 tablet (5 mg total) by mouth 2 (two) times daily.   30 tablet   11   . simvastatin (ZOCOR) 20 MG tablet   Oral   Take 1 tablet (20 mg total) by mouth at bedtime.   90 tablet   3   . tamsulosin (FLOMAX) 0.4 MG CAPS capsule   Oral   Take 1 capsule (0.4 mg total) by mouth daily after supper.   30 capsule   1    BP 178/99  Pulse 106  Temp(Src) 100.5 F (38.1 C) (Oral)  Resp 18  SpO2 93% Physical Exam  Nursing note and vitals reviewed. Constitutional: He is oriented to person, place, and time. He appears well-developed and well-nourished.  HENT:  Mouth/Throat: Oropharynx is clear and moist.   Neck: Normal range of motion. Neck supple.  Cardiovascular: Regular rhythm and intact distal pulses.  Tachycardia present.   Murmur heard.  Crescendo systolic murmur is present with a grade of 4/6   No diastolic murmur is present  Pulmonary/Chest: Effort normal and breath sounds normal.  Abdominal: Soft. Bowel sounds are normal. He exhibits no distension and no mass. There is no tenderness. There is no rebound and no guarding.  Lymphadenopathy:    He has no cervical adenopathy.  Neurological: He is alert and oriented to person, place, and time.  Skin: Skin is warm.    ED Course  Procedures (including critical care time) Labs Review Labs Reviewed  POCT URINALYSIS DIP (DEVICE) - Abnormal; Notable for the following:    Ketones, ur 15 (*)    Hgb urine dipstick SMALL (*)    Protein, ur 100 (*)    All other components within normal limits  URINE CULTURE  URINALYSIS, ROUTINE W REFLEX MICROSCOPIC   Imaging Review Dg Chest 2 View  02/01/2014   CLINICAL DATA:  Fever and shortness of breath  EXAM: CHEST  2 VIEW  COMPARISON:  CT ANGIO CHEST W/CM &/OR WO/CM dated 04/30/2013  FINDINGS: Left shoulder degenerative change reidentified. Flattening of the hemidiaphragms may suggest emphysematous change. No focal pulmonary opacity. Mild S-shaped curvature of the thoracic spine. Heart size is normal. The aorta is unfolded and ectatic. No pleural effusion.  IMPRESSION: No active cardiopulmonary disease.   Electronically Signed   By: Conchita Paris M.D.   On: 02/01/2014 16:02     MDM   1. Urinary bladder incontinence    Discussed with dr Marin Comment, plans as noted. 200cc urine on I+O cath, cath withdrawn. Urine cult obtained. To be seen in office on mon.  Billy Fischer, MD 02/01/14 [phone]

## 2014-02-01 NOTE — ED Notes (Signed)
Pt c/o fever x 2 days. Pt reports he just had skin cancer and staph infection on the top of his head and was taking Septra DS. Pts wife reports he has had lots of side effects to the drug including nausea. Also has been experiencing some urinary incontinence. Pt is alert and oriented. Wife reports he has woken up confused in the middle of the night.

## 2014-02-02 LAB — URINE CULTURE
COLONY COUNT: NO GROWTH
Culture: NO GROWTH
SPECIAL REQUESTS: NORMAL

## 2014-02-04 ENCOUNTER — Ambulatory Visit (INDEPENDENT_AMBULATORY_CARE_PROVIDER_SITE_OTHER): Payer: Medicare Other | Admitting: Internal Medicine

## 2014-02-04 ENCOUNTER — Other Ambulatory Visit (INDEPENDENT_AMBULATORY_CARE_PROVIDER_SITE_OTHER): Payer: Medicare Other

## 2014-02-04 ENCOUNTER — Encounter: Payer: Self-pay | Admitting: Internal Medicine

## 2014-02-04 VITALS — BP 148/78 | HR 101 | Temp 97.9°F | Resp 14 | Wt 202.0 lb

## 2014-02-04 DIAGNOSIS — R946 Abnormal results of thyroid function studies: Secondary | ICD-10-CM

## 2014-02-04 DIAGNOSIS — K5900 Constipation, unspecified: Secondary | ICD-10-CM | POA: Insufficient documentation

## 2014-02-04 DIAGNOSIS — R350 Frequency of micturition: Secondary | ICD-10-CM

## 2014-02-04 LAB — TSH: TSH: 4.57 u[IU]/mL (ref 0.35–5.50)

## 2014-02-04 LAB — T4, FREE: FREE T4: 1.12 ng/dL (ref 0.60–1.60)

## 2014-02-04 NOTE — Progress Notes (Signed)
   Subjective:    Patient ID: Jeremy Stone, male    DOB: 10-31-1925, 80 y.o.   MRN: [id number]  HPI  He has several concerns; the major concern was constipation which persisted from 4/10/-02/03/14. There was no specific trigger such as narcotics, but he had been on sulfa from his dermatologist for scalp lesions which have become infected. He used MiraLax ,prune juice and forced hydration. His stool 4/14 was elongated and large. BMs since have been  normal  His past history reveals only occasional constipation. His last TSH was 5.57 on 04/22/13.  He had colon Polyps in 2007; his colonoscopy was negative in 2012.    He also has had urinary frequency. He was seen by his urologist 4/14.    Review of Systems  He has postural hypotension and has been prescribed meclizine in the past for associated dizziness.  He denies fever, chills, sweats, weight loss  He's had no dysuria, pyuria, or hematuria.  He questions low-grade fever.  He was seen at urgent care 02/01/14. Random glucose was 136. His most recent A1c was 6.7%  On 3/18. BUN and creatinine were normal. There was no anemia present. A copy of the results were given to them.     Objective:   Physical Exam General appearance is one of good health and nourishment w/o distress.  Eyes: No conjunctival inflammation or scleral icterus is present.  Oral exam: Dental hygiene is fair; lips and gums are healthy appearing.There is no oropharyngeal erythema or exudate noted.   Heart:  Normal rate and regular rhythm. S1 and S2 normal without gallop,  click, rub or other extra sounds  .Grade 1/2 over 6 systolic murmur   Lungs:Chest clear to auscultation; no wheezes, rhonchi,rales ,or rubs present.No increased work of breathing.   Abdomen: bowel sounds normal, soft and non-tender without masses, organomegaly or hernias noted.  No guarding or rebound . Musculoskeletal: Limits ROM of LUE (due to "shoulder problem; no PMH CVA).Able to lie  flat and sit up without help. Negative straight leg raising bilaterally. Gait normal  Skin:Warm & dry.  Intact without suspicious lesions or rashes ; no jaundice. Minimal  tenting  Lymphatic: No lymphadenopathy is noted about the head, neck, axilla              Assessment & Plan:  #1Constipation #2 frequency, as per Urology #3 abnormal TFT #4 postural hypotension symptoms See Plan

## 2014-02-04 NOTE — Progress Notes (Signed)
Pre visit review using our clinic review tool, if applicable. No additional management support is needed unless otherwise documented below in the visit note. 

## 2014-02-04 NOTE — Patient Instructions (Signed)
Your next office appointment will be determined based upon review of your pending labs. Those instructions will be transmitted to you through My Chart   Followup as needed for your acute issue. Please report any significant change in your symptoms. Perform isometric exercise of calves  ( while seated go up on toes to count of 5 & then onto heels for 5 count). Repeat  4- 5 times prior to standing if you've been seated or supine for any significant period of time as BP drops with such positions. Natural interventions to treat or prevent constipation would include drinking to thirst, up to 32 ounces of fluids daily; eating 7-9 servings of fresh fruits or vegetables a day; and increasing roughage in the diet such as whole grains. The OTC  fiber products (Example: Metamucil, etc) can be employed if these natural maneuvers do not correct the issue. Finally MiraLax every third day as needed is an option. Minimal Blood Pressure Goal= AVERAGE < 140/90;  Ideal is an AVERAGE < 135/85. This AVERAGE should be calculated from @ least 5-7 BP readings taken @ different times of day on different days of week. You should not respond to isolated BP readings , but rather the AVERAGE for that week .Please bring your  blood pressure cuff to office visits to verify that it is reliable.It  can also be checked against the blood pressure device at the pharmacy. Finger or wrist cuffs are not dependable; an arm cuff is.

## 2014-05-13 ENCOUNTER — Other Ambulatory Visit: Payer: Self-pay | Admitting: Dermatology

## 2014-08-14 ENCOUNTER — Ambulatory Visit (INDEPENDENT_AMBULATORY_CARE_PROVIDER_SITE_OTHER): Payer: Medicare Other

## 2014-08-14 DIAGNOSIS — Z23 Encounter for immunization: Secondary | ICD-10-CM

## 2014-09-26 IMAGING — CR DG HIP (WITH OR WITHOUT PELVIS) 2-3V*L*
5 series · 5 of 5 positions shown · non-contrast
Comparison: CT 02/23/2007.

***ADDENDUM*** CREATED: 04/22/2013 [DATE]

Nonaneurysmal arterial calcifications are present.
***END ADDENDUM*** SIGNED BY: Manuella Tiger
CLINICAL DATA: Left hip pain for several months.  No history of
injury.
LEFT HIP - COMPLETE 2+ VIEW

[view not recorded (1 of 5)]
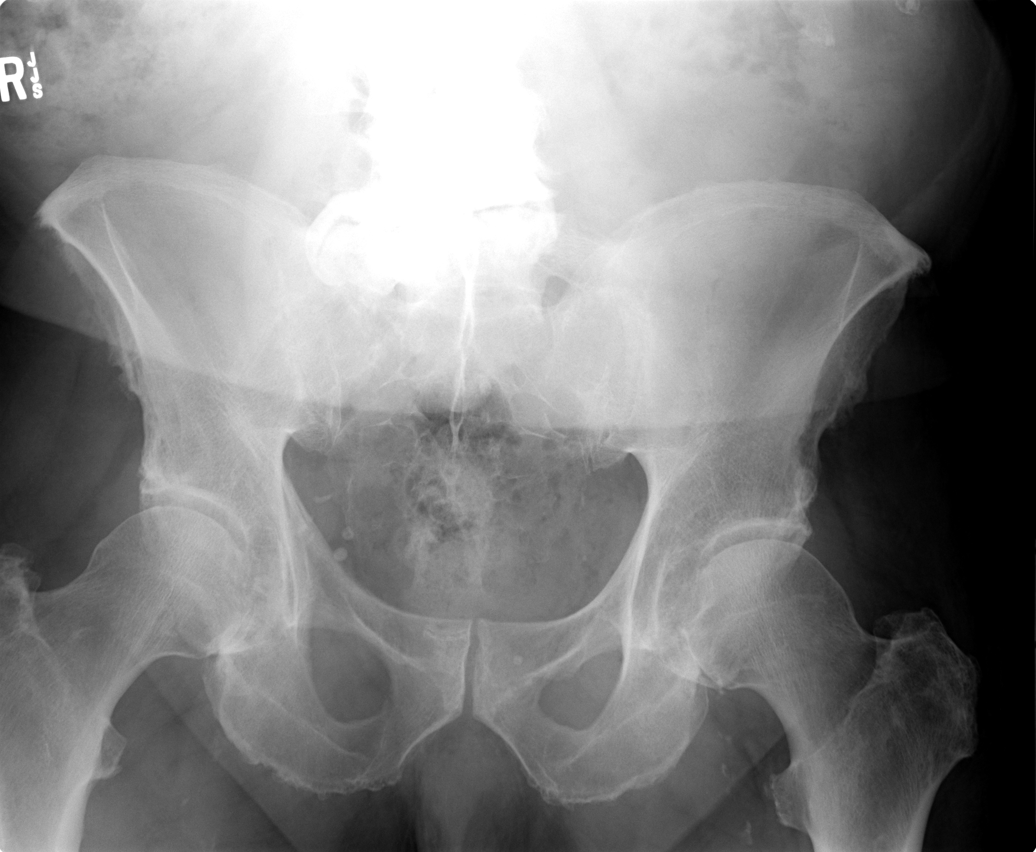

[view not recorded (2 of 5)]
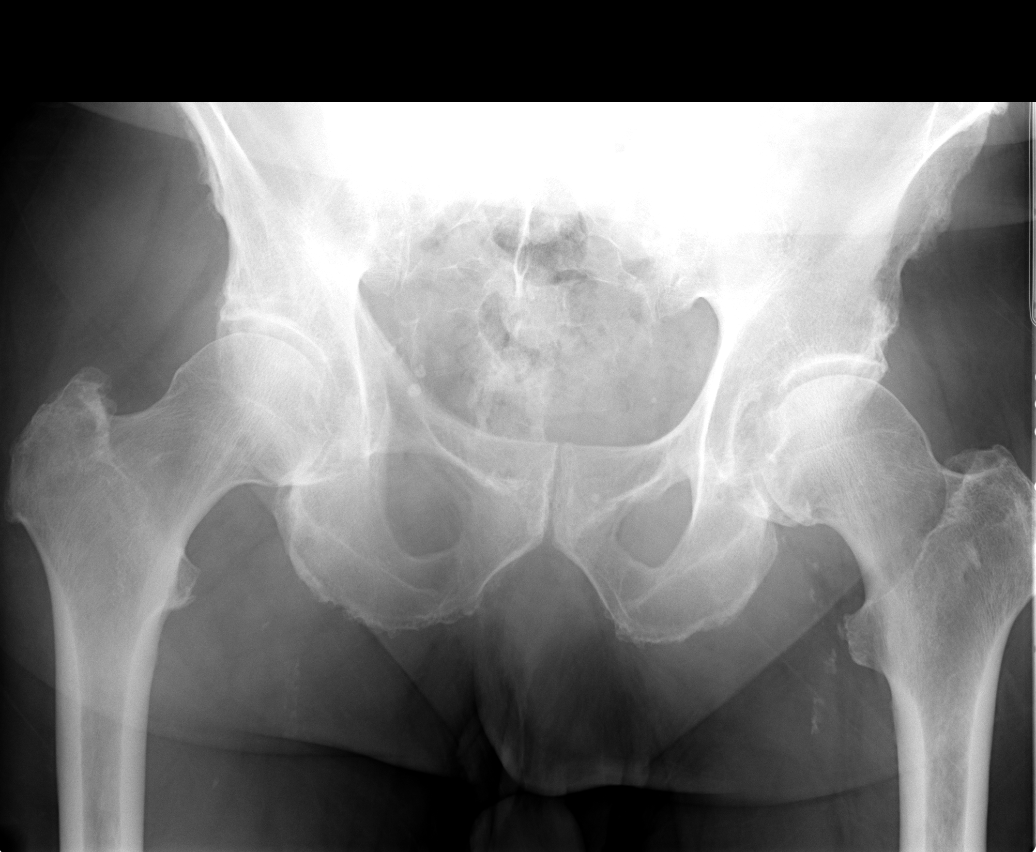

[view not recorded (3 of 5)]
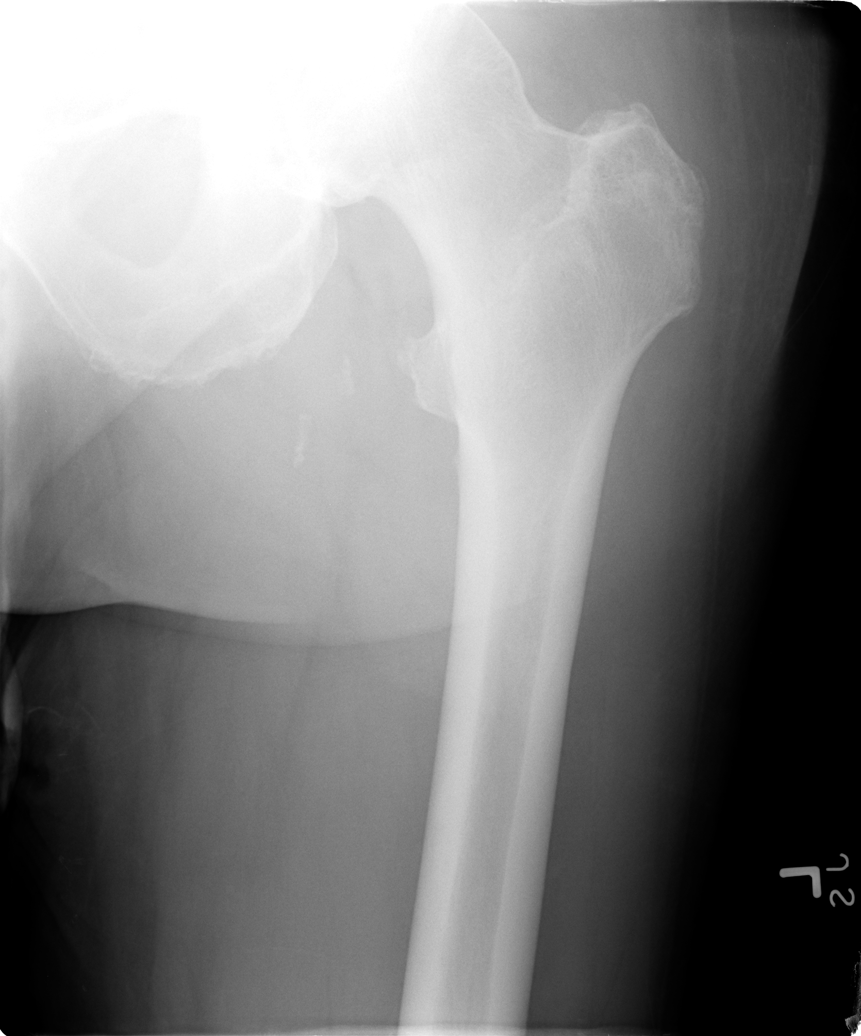

[view not recorded (4 of 5)]
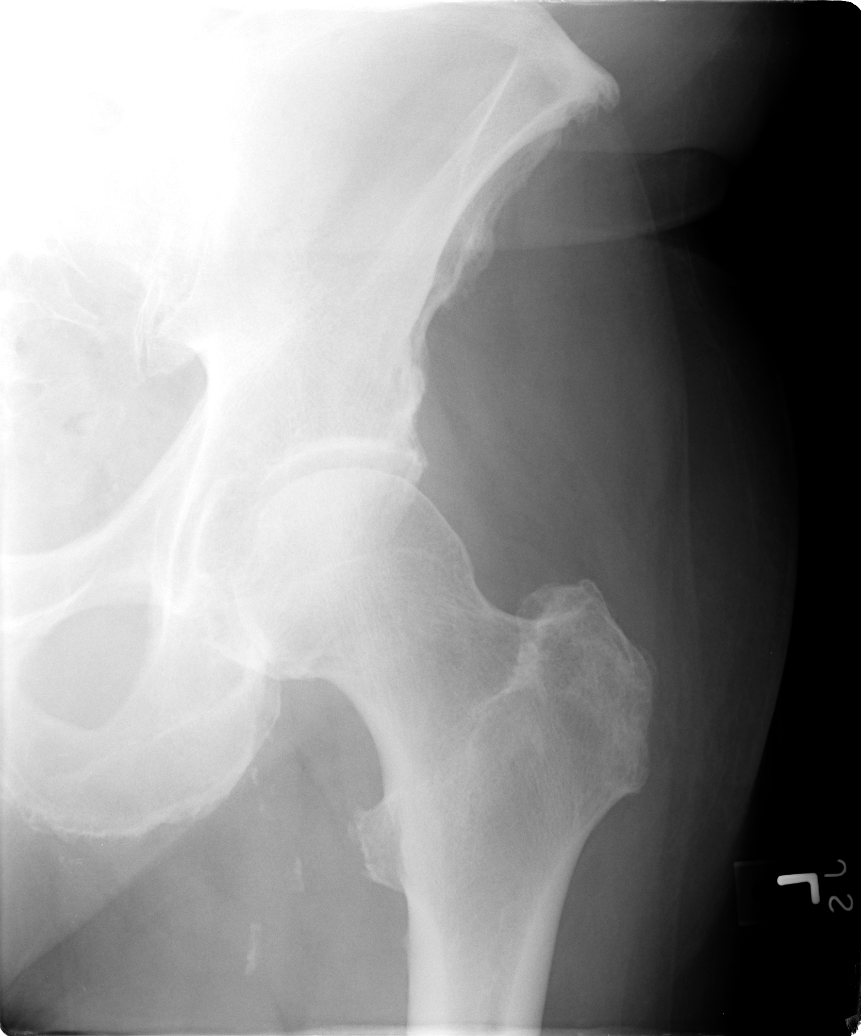

[view not recorded (5 of 5)]
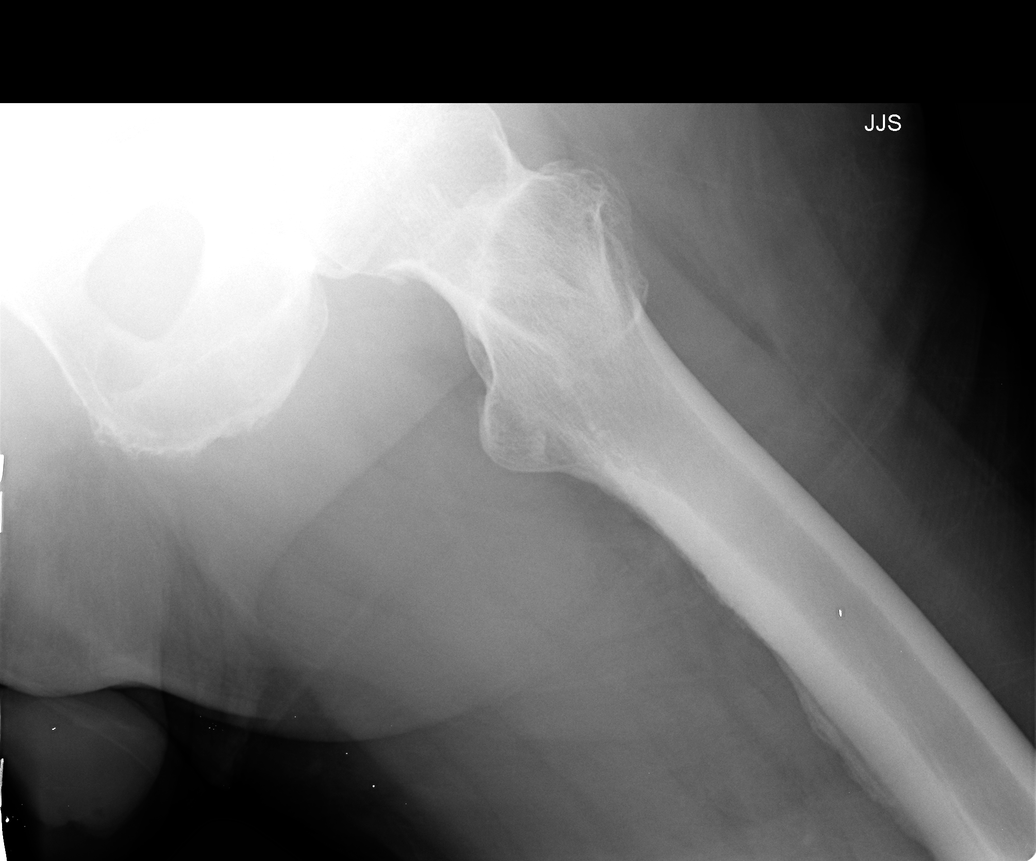

[5 of 5 positions shown; findings below may reference images not displayed]

FINDINGS: There are advanced changes of degenerative spondylosis
with degenerative disc disease.  There is a dense appearance of the
bone involving the L5 vertebra and the proximal aspect of the
sacrum S1 level.  This could be reactive sclerosis associated with
degenerative disc disease and degenerative spondylosis.  I cannot
exclude blastic involvement of bone.  It is the prostate gland of
the patient normal?.

No fracture or bony destruction is evident.  Hip joint spaces
appear preserved.  There is minimal acetabular spurring. No bony
destruction or evidence of calcific bursitis is seen.

Pelvic phleboliths are present.
IMPRESSION: Advanced changes of degenerative disc disease and degenerative
spondylosis in the lower lumbar spine and at the lumbosacral
junction.

There is a dense appearance of the bone involving the L5 vertebra
and the proximal aspect of the sacrum S1 level.  This could be
reactive sclerosis associated with degenerative disc disease and
degenerative spondylosis.  I cannot exclude blastic involvement of
bone.  It is the prostate gland of the patient normal?

No hip fracture or bony destruction. Minimal acetabular spurring is
present.

## 2015-04-19 ENCOUNTER — Other Ambulatory Visit: Payer: Self-pay

## 2015-05-31 ENCOUNTER — Telehealth: Payer: Self-pay | Admitting: Pulmonary Disease

## 2015-05-31 NOTE — Telephone Encounter (Signed)
Pt has not seen RA since 03/2013. appt scheduled for 8/29 at 3:45. Nothing further needed

## 2015-06-21 ENCOUNTER — Ambulatory Visit: Payer: Self-pay | Admitting: Pulmonary Disease

## 2015-10-01 ENCOUNTER — Telehealth: Payer: Self-pay | Admitting: Pulmonary Disease

## 2015-10-01 NOTE — Telephone Encounter (Signed)
Called and spoke with pt Pt stated that he is having difficulty with sleeping at night due to his mask and excessive dry mouth Pt states that he would like an appt to address these concerns Told pt that RA next available is not until 12/2015 but i could schedule pt to see TP on 10/04/15 Pt stated that he wanted to schedule  Appt made Nothing further is needed

## 2015-10-04 ENCOUNTER — Encounter: Payer: Self-pay | Admitting: Adult Health

## 2015-10-04 ENCOUNTER — Ambulatory Visit (INDEPENDENT_AMBULATORY_CARE_PROVIDER_SITE_OTHER): Payer: PPO | Admitting: Adult Health

## 2015-10-04 VITALS — BP 132/84 | HR 78 | Temp 97.7°F | Ht 68.0 in | Wt 202.0 lb

## 2015-10-04 DIAGNOSIS — G4733 Obstructive sleep apnea (adult) (pediatric): Secondary | ICD-10-CM | POA: Diagnosis not present

## 2015-10-04 NOTE — Patient Instructions (Signed)
Lets try nasal pillows to see if this helps facial issues  Call if issue is not resolved  Continue on CPAP At bedtime  .  Download requested.  Follow up with Dr. Elsworth Soho  In 1 year and As needed

## 2015-10-04 NOTE — Progress Notes (Signed)
  Subjective:    Patient ID: Jeremy Stone, male    DOB: August 27, 1926, 81 y.o.   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  Overnight polysomnogram will in 04/2006 showed severe obstructive sleep apnea with AHI of 52 events per hour and lowest desaturation of 85%. This was corrected by CPAP of 14 cm with a large full face mask.  Pt currently has CPAP. Pt wants to buy small CPAP. he is going to [place] next month and wants a portable machine for his visits outside the country. pt states he wears it everynight x 6-8 hrs a night. Denies any problems w/ mask/machine. Pressure seems fine. He wakes up with dry mouth that seems to be his only complaint.  Enjoys a daily nap for 30 minutes that refreshes him in the afternoon.  Bedtime is 11 PM, sleep latency is 10 minutes, he sleeps on his side with one pillow, he is 2 awakenings due to dryness of mouth and denies any post void sleep latency. He is out of bed at 7:30 AM feeling refreshed. Epworth sleepiness score is 7/24  He has recently changed insurance companies and is frustrated by the lack of cooperation from [facility], prefers [facility].  10/04/2015 Follow up : OSA  Returns for follow up for sleep apnea.  Has been on CPAP for many years doing well on nocturnal CPAP.  Last seen in 2014. Says he has been doing well.  Complains of extreme dryness along face with skin irritation .  Has recently changed masks and seems to be helping.  Also having to use cream on face -some help.  No recent download , requested from DME.  We discussed using nasal pillows until face irritation is healed.    Review of Systems neg for any significant sore throat, dysphagia, itching, sneezing, nasal congestion or excess/ purulent secretions, fever, chills, sweats, unintended wt loss, pleuritic or exertional cp, hempoptysis, orthopnea pnd or change in chronic leg swelling. Also denies presyncope, palpitations, heartburn, abdominal pain,  nausea, vomiting, diarrhea or change in bowel or urinary habits, dysuria,hematuria, rash, arthralgias, visual complaints, headache, numbness weakness or ataxia.     Objective:   Physical Exam  Gen. Pleasant, well-nourished, in no distress ENT - no lesions, no post nasal drip, class 2 airway  Neck: No JVD, no thyromegaly, no carotid bruits Lungs: no use of accessory muscles, no dullness to percussion, clear without rales or rhonchi  Cardiovascular: Rhythm regular, heart sounds  normal, no murmurs or gallops, no peripheral edema Musculoskeletal: No deformities, no cyanosis or clubbing  Skin  -mild redness along nasal folds and bridge of nose.        Assessment & Plan:

## 2015-10-05 NOTE — Assessment & Plan Note (Signed)
Well controlled on CPAP  Check download  Mild skin irritation from mask -change to nasal pillows for now  Plan Lets try nasal pillows to see if this helps facial issues  Call if issue is not resolved  Continue on CPAP At bedtime  .  Download requested.  Follow up with Dr. Elsworth Soho  In 1 year and As needed

## 2015-10-26 NOTE — Progress Notes (Signed)
Reviewed & agree with plan  

## 2015-11-11 DIAGNOSIS — E119 Type 2 diabetes mellitus without complications: Secondary | ICD-10-CM | POA: Diagnosis not present

## 2015-11-11 DIAGNOSIS — I10 Essential (primary) hypertension: Secondary | ICD-10-CM | POA: Diagnosis not present

## 2015-11-11 DIAGNOSIS — Z7984 Long term (current) use of oral hypoglycemic drugs: Secondary | ICD-10-CM | POA: Diagnosis not present

## 2016-01-26 DIAGNOSIS — M4726 Other spondylosis with radiculopathy, lumbar region: Secondary | ICD-10-CM | POA: Diagnosis not present

## 2016-01-26 DIAGNOSIS — M545 Low back pain: Secondary | ICD-10-CM | POA: Diagnosis not present

## 2016-02-10 ENCOUNTER — Other Ambulatory Visit: Payer: Self-pay | Admitting: Sports Medicine

## 2016-02-10 DIAGNOSIS — M65341 Trigger finger, right ring finger: Secondary | ICD-10-CM | POA: Diagnosis not present

## 2016-02-10 DIAGNOSIS — M542 Cervicalgia: Secondary | ICD-10-CM | POA: Diagnosis not present

## 2016-02-10 DIAGNOSIS — R531 Weakness: Secondary | ICD-10-CM

## 2016-02-10 DIAGNOSIS — M545 Low back pain: Secondary | ICD-10-CM

## 2016-02-18 ENCOUNTER — Other Ambulatory Visit: Payer: PPO

## 2016-03-23 DIAGNOSIS — J069 Acute upper respiratory infection, unspecified: Secondary | ICD-10-CM | POA: Diagnosis not present

## 2016-03-23 DIAGNOSIS — R5383 Other fatigue: Secondary | ICD-10-CM | POA: Diagnosis not present

## 2016-03-23 DIAGNOSIS — Z79899 Other long term (current) drug therapy: Secondary | ICD-10-CM | POA: Diagnosis not present

## 2016-03-31 DIAGNOSIS — J301 Allergic rhinitis due to pollen: Secondary | ICD-10-CM | POA: Diagnosis not present

## 2016-04-06 DIAGNOSIS — M545 Low back pain: Secondary | ICD-10-CM | POA: Diagnosis not present

## 2016-04-06 DIAGNOSIS — M4726 Other spondylosis with radiculopathy, lumbar region: Secondary | ICD-10-CM | POA: Diagnosis not present

## 2016-04-17 ENCOUNTER — Ambulatory Visit
Admission: RE | Admit: 2016-04-17 | Discharge: 2016-04-17 | Disposition: A | Discharge status: Home / Self Care | Payer: PPO | Source: Ambulatory Visit | Attending: Sports Medicine | Admitting: Sports Medicine

## 2016-04-17 DIAGNOSIS — M545 Low back pain: Secondary | ICD-10-CM

## 2016-04-17 DIAGNOSIS — M4806 Spinal stenosis, lumbar region: Secondary | ICD-10-CM | POA: Diagnosis not present

## 2016-04-17 DIAGNOSIS — R531 Weakness: Secondary | ICD-10-CM

## 2016-04-18 DIAGNOSIS — M542 Cervicalgia: Secondary | ICD-10-CM | POA: Diagnosis not present

## 2016-04-18 DIAGNOSIS — M545 Low back pain: Secondary | ICD-10-CM | POA: Diagnosis not present

## 2016-04-18 DIAGNOSIS — M4726 Other spondylosis with radiculopathy, lumbar region: Secondary | ICD-10-CM | POA: Diagnosis not present

## 2016-04-20 ENCOUNTER — Other Ambulatory Visit: Payer: Self-pay | Admitting: Sports Medicine

## 2016-04-20 DIAGNOSIS — M545 Low back pain: Secondary | ICD-10-CM

## 2016-06-08 DIAGNOSIS — M65341 Trigger finger, right ring finger: Secondary | ICD-10-CM | POA: Diagnosis not present

## 2016-08-30 ENCOUNTER — Other Ambulatory Visit: Payer: Self-pay | Admitting: Dermatology

## 2016-08-30 DIAGNOSIS — L570 Actinic keratosis: Secondary | ICD-10-CM | POA: Diagnosis not present

## 2016-08-30 DIAGNOSIS — D239 Other benign neoplasm of skin, unspecified: Secondary | ICD-10-CM | POA: Diagnosis not present

## 2016-08-30 DIAGNOSIS — D044 Carcinoma in situ of skin of scalp and neck: Secondary | ICD-10-CM | POA: Diagnosis not present

## 2016-09-08 ENCOUNTER — Encounter (INDEPENDENT_AMBULATORY_CARE_PROVIDER_SITE_OTHER): Payer: Self-pay | Admitting: Sports Medicine

## 2016-09-08 ENCOUNTER — Ambulatory Visit (INDEPENDENT_AMBULATORY_CARE_PROVIDER_SITE_OTHER): Payer: PPO | Admitting: Sports Medicine

## 2016-09-08 VITALS — BP 149/87 | HR 75 | Ht 68.0 in | Wt 200.0 lb

## 2016-09-08 DIAGNOSIS — M7541 Impingement syndrome of right shoulder: Secondary | ICD-10-CM | POA: Diagnosis not present

## 2016-09-08 DIAGNOSIS — M25552 Pain in left hip: Secondary | ICD-10-CM

## 2016-09-08 NOTE — Progress Notes (Signed)
Jeremy Stone - 82 y.o. male MRN [id number]  Date of birth: Aug 01, 1926  Office Visit Note: Visit Date: 09/08/2016 PCP: Irven Shelling, MD Referred by: Lavone Orn, MD  Subjective: Chief Complaint  Patient presents with  . Left Leg - Follow-up  . Follow-up    Pateint states left leg pain.  Patient states right shoulder pain for about a year.  Pain comes and goes.  Can raise right arm above his head, but can't lay on right shoulder.     HPI: Follow up & reevaluation of multiple complaints. Left leg & difficulty walking: He is had persistent ongoing pain that is located in the anterior posterior aspect of his leg. He has had 2 falls in the past several months due to shuffling & tripping. Pain is located in the neck & anterior thigh in a C-shaped pattern. RIGHT shoulder: Persistent pain for quite some time. Has been worsening over the past several months. Pain with any type of overhead motion.  ROS: End denies any fevers, chills recent weight gain or weight loss. His activity level has significantly decreased. No significant left arm pain. Otherwise per HPI.   Clinical History: No specialty comments available.  He reports that he quit smoking about 33 years ago. His smoking use included Cigarettes. He has a 20.00 pack-year smoking history. He has never used smokeless tobacco.  No results for input(s): HGBA1C, LABURIC in the last 8760 hours.  Assessment & Plan: Visit Diagnoses:  1. Pain in left hip   2. Impingement syndrome of right shoulder    Plan: Patient had remarkable improvement in his left leg & buttock pain following injection of his left hip.  We discussed the options for repeating this every 3-4 months as indicated or considering further intervention such as a total hip arthroplasty but he will need further workup & evaluation if he is to consider this & currently at this time is leaning away from any type of surgical options.  Total of 2-1/5mL's of 40mg  Depo-Medrol  was injected intra-articularly & subacromially with some wasted from each injection site. Emphasized importance of monitoring sugars. RIGHT shoulder had subacromial impingement we'll see he does with injection & avoidance of overhead activities. Follow-up: Return if symptoms worsen or fail to improve.  Meds: No orders of the defined types were placed in this encounter.  Procedures: No notes on file  Left Hip US Guided Intra-Articular Injection Date/Time: 09/08/2016 5:00 PM Performed by: Teresa Coombs D Authorized by: Teresa Coombs D   Location:  Hip Site:  L hip joint Needle Size:  22 G Needle Length:  3.5 inches Approach:  Anterior Ultrasound Guidance: Yes   Fluoroscopic Guidance: No   Arthrogram: No   Medications:  80 mg methylPREDNISolone acetate 40 MG/ML; 2 mL bupivacaine 0.5 % Aspiration Attempted: No    The patient's clinical condition is marked by substantial pain and/or significant functional disability. Other conservative therapy has not provided relief, is contraindicated, or not appropriate. There is a reasonable likelihood that injection will significantly improve the patient's pain and/or functional impairment.  After discussing the risks, benefits and expected outcomes of the injection and all questions were reviewed and answered, the patient wished to undergo the above named procedure.  Verbal consent was obtained. The target sight was prepped with alcohol scrub. Local anesthesia was obtained with ethyl chloride and 34mL of 1% lidocaine on a 25g needle.  Under real-time ultrasound guidance, Injection of the target structure was performed using the above needle and medications  under sterile stopcock technique. Band-Aid was applied. The patient tolerated this procedure well with no immediate complications. Post injection instructions were provided.   Right Shoulder Subacromial Injection Date/Time: 09/08/2016 5:15 PM Performed by: Teresa Coombs D Authorized by: Teresa Coombs D    Consent Given by:  Patient Site marked: the procedure site was marked   Timeout: prior to procedure the correct patient, procedure, and site was verified   Indications:  Pain and diagnostic evaluation Location:  Shoulder Site:  R subacromial bursa Prep: patient was prepped and draped in usual sterile fashion   Needle Size:  22 G Needle Length:  1.5 inches Approach:  Posterior Ultrasound Guidance: No   Fluoroscopic Guidance: No   Arthrogram: No   Medications:  3 mL lidocaine 1 %; 40 mg methylPREDNISolone acetate 40 MG/ML Aspiration Attempted: No   Patient tolerance:  Patient tolerated the procedure well with no immediate complications     Objective:  VS:  HT:5\' 8"  (172.7 cm)   WT:200 lb (90.7 kg)  BMI:30.5    BP:(!) 149/87  HR:75bpm  TEMP: ( )  RESP:  Physical Exam: Walks with a crouched coxalgic gait. Has marked pain with any type of internal & external rotation of the left hip. Negative straight leg raise. Positive log roll. Positive Stinchfield. Lower extremity sensation intact to light touch. Negative braggart stretch test. No pain in the popliteal space. Some pain & greater sciatic notch. Right shoulder: Pain with Hawkins as well as Neer's. Internal rotation, external rotation strength intact. Empty can strength is normal in minimally painful. There is crepitation with axial loading & circumduction over the before meals as well as glenohumeral joint. He is mildly tender over the [facility] Joint more focally over the anterior glenoid.  Imaging: No results found.   Past Medical/Family/Surgical/Social History: Medications & Allergies reviewed per EMR Patient Active Problem List   Diagnosis Date Noted  . Unspecified constipation 02/04/2014  . Rhinitis, non-allergic 10/23/2013  . PRIMARY HYPERCOAGULABLE STATE 08/18/2009  . CHRONIC PULMONARY EMBOLISM 08/09/2009  . HYPERTENSION 08/04/2009  . DRY MOUTH 06/09/2008  . CARDIAC MURMUR, SYSTOLIC [id number]  . URINARY FREQUENCY  06/09/2008  . DEPRESSIVE DISORDER 04/01/2008  . Obstructive sleep apnea 04/01/2008  . INSOMNIA-SLEEP DISORDER-UNSPEC 04/01/2008  . DM, UNCOMPLICATED, TYPE II 0000000  . TRIGEMINAL NEURALGIA 07/08/2007  . HYPERLIPIDEMIA 07/01/2007  . GERD 07/01/2007   Past Medical History:  Diagnosis Date  . GERD (gastroesophageal reflux disease)   . Hyperlipidemia   . Hypertension   . NIDDM (non-insulin dependent diabetes mellitus)   . OSA (obstructive sleep apnea)    uses CPAP  . Thyroid nodule   . Trigeminal neuralgia    Family History  Problem Relation Age of Onset  . Dementia Sister    Past Surgical History:  Procedure Laterality Date  . APPENDECTOMY    . TEE WITHOUT CARDIOVERSION N/A 05/01/2013   Procedure: TRANSESOPHAGEAL ECHOCARDIOGRAM (TEE);  Surgeon: Thayer Headings, MD;  Location: [facility];  Service: Cardiovascular;  Laterality: N/A;  Need to make sure [facility] is arranged. Notifed RN at [employer] to do this on 04-30-2013  . trigeminal surgery     07-22-07- [facility]   Social History   Occupational History  . businessman    Social History Main Topics  . Smoking status: Former Smoker    Packs/day: 1.00    Years: 20.00    Types: Cigarettes    Quit date: 10/23/1982  . Smokeless tobacco: Never Used  . Alcohol use 5.0 oz/week  10 drink(s) per week     Comment: wine with dinner  . Drug use: No  . Sexual activity: Yes    Partners: Female

## 2016-09-10 MED ORDER — LIDOCAINE HCL 1 % IJ SOLN
3.0000 mL | INTRAMUSCULAR | Status: AC | PRN
  Administered 2016-09-08: 3 mL

## 2016-09-10 MED ORDER — BUPIVACAINE HCL 0.5 % IJ SOLN
2.0000 mL | INTRAMUSCULAR | Status: AC | PRN
  Administered 2016-09-08: 2 mL via INTRA_ARTICULAR

## 2016-09-10 MED ORDER — METHYLPREDNISOLONE ACETATE 40 MG/ML IJ SUSP
40.0000 mg | INTRAMUSCULAR | Status: AC | PRN
  Administered 2016-09-08: 40 mg via INTRA_ARTICULAR

## 2016-09-10 MED ORDER — METHYLPREDNISOLONE ACETATE 40 MG/ML IJ SUSP
80.0000 mg | INTRAMUSCULAR | Status: AC | PRN
  Administered 2016-09-08: 80 mg

## 2016-09-10 NOTE — Patient Instructions (Addendum)
I am transferring practices as of January 1st  to [place] at [facility].  This is a great opportunity & I am saddened to be leaving [facility] however & excited for new opportunities. I will continue to be seeing patients at [facility] through the end of December. I am happy to see you at the new location but also am confident that you are in great hands with the excellent providers here at [facility].  We are not currently scheduling patients at the new location at this time but if you look on [facility]'s website a contact information should be available there closer to January. Additionally [email or web address] will have information when it becomes available.    The telephone number will be [phone]  - Nobody will be answering this phone number until closer to January.  Elderly male. In no acute distress. Walks with a cane.

## 2016-09-21 ENCOUNTER — Ambulatory Visit (INDEPENDENT_AMBULATORY_CARE_PROVIDER_SITE_OTHER): Payer: PPO | Admitting: Sports Medicine

## 2016-09-21 ENCOUNTER — Encounter (INDEPENDENT_AMBULATORY_CARE_PROVIDER_SITE_OTHER): Payer: Self-pay | Admitting: Sports Medicine

## 2016-09-21 VITALS — BP 138/78 | HR 83 | Ht 69.0 in | Wt 198.0 lb

## 2016-09-21 DIAGNOSIS — M25552 Pain in left hip: Secondary | ICD-10-CM | POA: Diagnosis not present

## 2016-09-21 DIAGNOSIS — M5417 Radiculopathy, lumbosacral region: Secondary | ICD-10-CM | POA: Diagnosis not present

## 2016-09-21 DIAGNOSIS — M4306 Spondylolysis, lumbar region: Secondary | ICD-10-CM | POA: Diagnosis not present

## 2016-09-21 DIAGNOSIS — M1612 Unilateral primary osteoarthritis, left hip: Secondary | ICD-10-CM

## 2016-09-21 MED ORDER — TRAMADOL HCL 50 MG PO TABS: 50.0000 mg | ORAL_TABLET | Freq: Four times a day (QID) | ORAL | 0 refills | Status: AC | PRN

## 2016-09-21 MED ORDER — GABAPENTIN 300 MG PO CAPS: ORAL_CAPSULE | ORAL | 1 refills | Status: DC

## 2016-09-21 NOTE — Progress Notes (Signed)
Jeremy Stone - 82 y.o. male MRN [id number]  Date of birth: [birth year]  Office Visit Note: Visit Date: 09/21/2016 PCP: Irven Shelling, MD Referred by: Lavone Orn, MD  Subjective: Chief Complaint  Patient presents with  . Left Hip - Follow-up    States the shoulder injection did great! The hip injection-not so much. Can't walk very far without pain. He ambulates with cane.   . Right Shoulder - Follow-up   HPI: 2 week follow-up for left hip & leg pain with difficulty walking. He noted great improvement immediately following the injection as well as the next day. However on Monday following the injection on Friday he had recurrence of pain & difficulty walking. He is had issues even going to the grocery store with his wife to the point that is been unable stand for greater than 5-10 minutes. Pain is radiating down the posterior aspect of his leg past the knee. The groin pain does seem to be improved but is still symptomatically for him. He denies any fevers chills recently gain or weight loss. He is having a significant difficulty with walking & has become quite sedentary due to the discomfort he experiences. ROS: Otherwise per HPI.   Clinical History: MRI lumbar spine from 04/17/2016: Spontaneous arthrodesis of L2-L5 with severe spinal stenosis at L5-S1. Subarticular zone narrowing at left L2-L5 nerve roots with right-sided involvement as well. Findings do suggest ankylosing spondylitis when he does not have a diagnosis of this.  His symptoms almost spontaneous years resolved after the MRI was obtained & no epidural was performed.  He reports that he quit smoking about 33 years ago. His smoking use included Cigarettes. He has a 20.00 pack-year smoking history. He has never used smokeless tobacco.  No results for input(s): HGBA1C, LABURIC in the last 8760 hours.  Assessment & Plan: Visit Diagnoses:    ICD-9-CM ICD-10-CM   1. Spondylolysis, lumbar region 738.4 M43.06 Ambulatory  referral to Physical Medicine Rehab  2. Radiculopathy, lumbosacral region 724.4 M54.17 Ambulatory referral to Physical Medicine Rehab  3. Pain in left hip 719.45 M25.552   4. Unilateral primary osteoarthritis, left hip 715.15 M16.12     Plan: Liquid patient does have 2 separate conditions that are likely contributing to his difficulty walking. The severe spinal stenosis that he has is likely the culprit at this time is in the pain does seem be more the posterior aspect of his leg as opposed to the groin which was previous. This hip arthritis that he has is severe as well as the back arthritis that essentially has a spontaneous arthrodesis of L2-L5 with severe spinal stenosis at L5-S1. He has significant nerve root impingement on his MRI as reviewed. Therapeutic & diagnostic epidural steroid injection indicated at this time & referral placed to Dr. Ernestina Patches. Additionally we discussed the options from gabapentinoids & tramadol understanding the black box warning on tramadol for his age. To proceed with these medications understanding the black box warnings. Follow-up: Return for With Dr. Ernestina Patches for [facility].  Meds:  Meds ordered this encounter  Medications  . gabapentin (NEURONTIN) 300 MG capsule    Sig: Start with 1 tab po qhs X 1 week, then increase to 1 tab po bid X 1 week then 1 tab po tid prn    Dispense:  90 capsule    Refill:  1  . traMADol (ULTRAM) 50 MG tablet    Sig: Take 1 tablet (50 mg total) by mouth every 6 (six) hours as needed for  moderate pain.    Dispense:  30 tablet    Refill:  0   Procedures: No notes on file   Objective:  VS:  HT:5\' 9"  (175.3 cm)   WT:198 lb (89.8 kg)  BMI:29.3    BP:138/78  HR:83bpm  TEMP: ( )  RESP:  Physical Exam: Elderly male. No acute distress. He is alert & appropriate. His sit-to-stand test is < 5 seconds. He has pain with internal rotation of the hip as well as with straight leg raise & severe pain with palpation of the greater sciatic notch & and  popliteal fossa. EHL & posterior tibialis is intact. Positive Stinchfield test. Imaging: No results found.  Past Medical/Family/Surgical/Social History: Medications & Allergies reviewed per EMR Patient Active Problem List   Diagnosis Date Noted  . Unspecified constipation 02/04/2014  . Rhinitis, non-allergic 10/23/2013  . PRIMARY HYPERCOAGULABLE STATE 08/18/2009  . CHRONIC PULMONARY EMBOLISM 08/09/2009  . HYPERTENSION 08/04/2009  . DRY MOUTH 06/09/2008  . CARDIAC MURMUR, SYSTOLIC [id number]  . URINARY FREQUENCY 06/09/2008  . DEPRESSIVE DISORDER 04/01/2008  . Obstructive sleep apnea 04/01/2008  . INSOMNIA-SLEEP DISORDER-UNSPEC 04/01/2008  . DM, UNCOMPLICATED, TYPE II 0000000  . TRIGEMINAL NEURALGIA 07/08/2007  . HYPERLIPIDEMIA 07/01/2007  . GERD 07/01/2007   Past Medical History:  Diagnosis Date  . GERD (gastroesophageal reflux disease)   . Hyperlipidemia   . Hypertension   . NIDDM (non-insulin dependent diabetes mellitus)   . OSA (obstructive sleep apnea)    uses CPAP  . Thyroid nodule   . Trigeminal neuralgia    Family History  Problem Relation Age of Onset  . Dementia Sister    Past Surgical History:  Procedure Laterality Date  . APPENDECTOMY    . TEE WITHOUT CARDIOVERSION N/A 05/01/2013   Procedure: TRANSESOPHAGEAL ECHOCARDIOGRAM (TEE);  Surgeon: Thayer Headings, MD;  Location: [facility];  Service: Cardiovascular;  Laterality: N/A;  Need to make sure [facility] is arranged. Notifed RN at [employer] to do this on 04-30-2013  . trigeminal surgery     07-22-07- [facility]   Social History   Occupational History  . businessman    Social History Main Topics  . Smoking status: Former Smoker    Packs/day: 1.00    Years: 20.00    Types: Cigarettes    Quit date: 10/23/1982  . Smokeless tobacco: Never Used  . Alcohol use 5.0 oz/week    10 drink(s) per week     Comment: wine with dinner  . Drug use: No  . Sexual activity: Yes    Partners: Female

## 2016-09-21 NOTE — Patient Instructions (Signed)
I am transferring practices as of January 1st  to [facility] at [place].  This is a great opportunity & I am saddened to be leaving [facility] however & excited for new opportunities. I will continue to be seeing patients at [facility] through the end of December. I am happy to see you at the new location but also am confident that you are in great hands with the excellent providers here at [facility].  We are not currently scheduling patients at the new location at this time but if you look on [facility]'s website a contact information should be available there closer to January. Additionally [email or web address] will have information when it becomes available.    The telephone number will be [phone]  - Nobody will be answering this phone number until closer to January. 

## 2016-09-23 ENCOUNTER — Encounter (INDEPENDENT_AMBULATORY_CARE_PROVIDER_SITE_OTHER): Payer: Self-pay | Admitting: Sports Medicine

## 2016-10-01 DIAGNOSIS — R1013 Epigastric pain: Secondary | ICD-10-CM | POA: Diagnosis not present

## 2016-10-03 ENCOUNTER — Encounter (INDEPENDENT_AMBULATORY_CARE_PROVIDER_SITE_OTHER): Payer: PPO | Admitting: Physical Medicine and Rehabilitation

## 2016-10-04 ENCOUNTER — Encounter (INDEPENDENT_AMBULATORY_CARE_PROVIDER_SITE_OTHER): Payer: Self-pay | Admitting: Physical Medicine and Rehabilitation

## 2016-10-04 ENCOUNTER — Ambulatory Visit (INDEPENDENT_AMBULATORY_CARE_PROVIDER_SITE_OTHER): Payer: PPO | Admitting: Physical Medicine and Rehabilitation

## 2016-10-04 VITALS — BP 122/67 | HR 91 | Temp 97.7°F

## 2016-10-04 DIAGNOSIS — M5416 Radiculopathy, lumbar region: Secondary | ICD-10-CM | POA: Diagnosis not present

## 2016-10-04 DIAGNOSIS — M5116 Intervertebral disc disorders with radiculopathy, lumbar region: Secondary | ICD-10-CM | POA: Diagnosis not present

## 2016-10-04 DIAGNOSIS — M48062 Spinal stenosis, lumbar region with neurogenic claudication: Secondary | ICD-10-CM | POA: Diagnosis not present

## 2016-10-04 MED ORDER — LIDOCAINE HCL (PF) 1 % IJ SOLN: 0.3300 mL | Freq: Once | INTRAMUSCULAR | Status: AC

## 2016-10-04 MED ORDER — METHYLPREDNISOLONE ACETATE 80 MG/ML IJ SUSP
80.0000 mg | Freq: Once | INTRAMUSCULAR | Status: AC
  Administered 2016-10-04: 80 mg

## 2016-10-04 NOTE — Progress Notes (Signed)
Jeremy Stone - 82 y.o. male MRN [id number]  Date of birth: July 18, 1926  Office Visit Note: Visit Date: 10/04/2016 PCP: Irven Shelling, MD Referred by: Lavone Orn, MD  Subjective: Chief Complaint  Patient presents with  . Lower Back - Pain   HPI: Jeremy Stone is an 82 year old gentleman who is accompanied by his wife who provided some of the history. He is complaining of weakness in lower back left side and whole left leg. No actual back pain. Difficulty walking. Ambulates with cane. He reports 2 types of pain was more of the groin pain with rotation. He also gets posterior lateral pain down to the calf. He does feel some dysesthesia but no frank numbness tingling. The pain is only really when he is up walking. He is having left knee pain as well but he does have magnets taped to his knee using masking tape. He evidently has studied magnetism for many years and feels like it helps with inflammation or getting blood flow to that area. They did try magnets Stone the hip that did not help. Jeremy Stone the need to help.  Also complaining of extreme fatigue and No appetite x 4 days. Constipation. Started Tramadol last Thursday. Helped with pain but wondering if that caused these symptoms. Hasn't taken anymore since Saturday.    ROS Otherwise per HPI.  Assessment & Plan: Visit Diagnoses:  1. Lumbar radiculopathy   2. Spinal stenosis of lumbar region with neurogenic claudication   3. Radiculopathy due to lumbar intervertebral disc disorder     Plan: Findings:  Left S1 transforaminal epidural steroid injection diagnostically and hopefully therapeutically. This will at least help figure out of part of this is hip related. His lumbar spine is very degenerative and may be a sequela of ankylosing spondylitis. He could just be really severe degenerative arthritis. At L5-S1 there is disc extrusion with stenosis centrally and lateral recesses more right than left but does occur Stone the left  side. At L4-5 there is moderate stenosis which is more bony stenosis. He does have a lumbarized S1 segment which is transitional based Stone numbering scheme of the MRI scan.    Meds & Orders:  Meds ordered this encounter  Medications  . lidocaine (PF) (XYLOCAINE) 1 % injection 0.3 mL  . methylPREDNISolone acetate (DEPO-MEDROL) injection 80 mg    Orders Placed This Encounter  Procedures  . Epidural Steroid injection    Follow-up: Return for Scheduled follow-up with Dr. Paulla Fore.   Procedures: No procedures performed  S1 Lumbosacral Transforaminal Epidural Steroid Injection - Sub-Pedicular Approach with Fluoroscopic Guidance   Patient: Jeremy Stone      Date of Birth: 1926/06/30 MRN: [id number] PCP: Irven Shelling, MD      Visit Date: 10/04/2016   Universal Protocol:    Date/Time: 10/04/1710:44 AM  Consent Given By: the patient  Position:  PRONE  Additional Comments: Vital signs were monitored before and after the procedure. Patient was prepped and draped in the usual sterile fashion. The correct patient, procedure, and site was verified.   Injection Procedure Details:  Procedure Site One Meds Administered:  Meds ordered this encounter  Medications  . lidocaine (PF) (XYLOCAINE) 1 % injection 0.3 mL  . methylPREDNISolone acetate (DEPO-MEDROL) injection 80 mg    Laterality: Left  Location/Site: Patient's S1 vertebral body is lumbarized S1 Foramen   Needle size: 22 G  Needle type: Spinal  Needle Placement: Transforaminal  Findings:  -Contrast Used: 1 mL iohexol 180 mg iodine/mL   -  Comments: Excellent flow of contrast along the nerve and into the epidural space.  Procedure Details: After squaring off the sacral end-plate to get a true AP view, the C-arm was positioned so that the best possible view of the S1 foramen was visualized. The soft tissues overlying this structure were infiltrated with 2-3 ml. of 1% Lidocaine without Epinephrine.    The spinal  needle was inserted toward the target using a "trajectory" view along the fluoroscope beam.  Under AP and lateral visualization, the needle was advanced so it did not puncture dura. Biplanar projections were used to confirm position. Aspiration was confirmed to be negative for CSF and/or blood. A 1-2 ml. volume of Isovue-250 was injected and flow of contrast was noted at each level. Radiographs were obtained for documentation purposes.   After attaining the desired flow of contrast documented above, a 0.5 to 1.0 ml test dose of 0.25% Marcaine was injected into each respective transforaminal space.  The patient was observed for 90 seconds post injection.  After no sensory deficits were reported, and normal lower extremity motor function was noted,   the above injectate was administered so that equal amounts of the injectate were placed at each foramen (level) into the transforaminal epidural space.   Additional Comments:  The patient tolerated the procedure well Dressing: Band-Aid    Post-procedure details: Patient was observed during the procedure. Post-procedure instructions were reviewed.  Patient left the clinic in stable condition.        Clinical History: No specialty comments available.  He reports that he quit smoking about 33 years ago. His smoking use included Cigarettes. He has a 20.00 pack-year smoking history. He has never used smokeless tobacco. No results for input(s): HGBA1C, LABURIC in the last 8760 hours.  Objective:  VS:  HT:    WT:   BMI:     BP:122/67  HR:91bpm  TEMP:97.7 F (36.5 C)(Oral)  RESP:98 % Physical Exam  Musculoskeletal:  He is slow to rise from a seated position and ambulates with a significantly forward flexed spine. He does have good distal strength.    Ortho Exam Imaging: No results found.  Past Medical/Family/Surgical/Social History: Medications & Allergies reviewed per EMR Patient Active Problem List   Diagnosis Date Noted  . Lumbar  radiculopathy 10/04/2016  . Spinal stenosis of lumbar region with neurogenic claudication 10/04/2016  . Radiculopathy due to lumbar intervertebral disc disorder 10/04/2016  . Unspecified constipation 02/04/2014  . Rhinitis, non-allergic 10/23/2013  . PRIMARY HYPERCOAGULABLE STATE 08/18/2009  . CHRONIC PULMONARY EMBOLISM 08/09/2009  . HYPERTENSION 08/04/2009  . DRY MOUTH 06/09/2008  . CARDIAC MURMUR, SYSTOLIC [id number]  . URINARY FREQUENCY 06/09/2008  . DEPRESSIVE DISORDER 04/01/2008  . Obstructive sleep apnea 04/01/2008  . INSOMNIA-SLEEP DISORDER-UNSPEC 04/01/2008  . DM, UNCOMPLICATED, TYPE II 0000000  . TRIGEMINAL NEURALGIA 07/08/2007  . HYPERLIPIDEMIA 07/01/2007  . GERD 07/01/2007   Past Medical History:  Diagnosis Date  . GERD (gastroesophageal reflux disease)   . Hyperlipidemia   . Hypertension   . NIDDM (non-insulin dependent diabetes mellitus)   . OSA (obstructive sleep apnea)    uses CPAP  . Thyroid nodule   . Trigeminal neuralgia    Family History  Problem Relation Age of Onset  . Dementia Sister    Past Surgical History:  Procedure Laterality Date  . APPENDECTOMY    . TEE WITHOUT CARDIOVERSION N/A 05/01/2013   Procedure: TRANSESOPHAGEAL ECHOCARDIOGRAM (TEE);  Surgeon: Thayer Headings, MD;  Location: [place];  Service: Cardiovascular;  Laterality: N/A;  Need to make sure Car[facility] is arranged. Notifed RN at [employer] [facility] to do this Stone 04-30-2013  . trigeminal surgery     07-22-07- [facility]   Social History   Occupational History  . businessman    Social History Main Topics  . Smoking status: Former Smoker    Packs/day: 1.00    Years: 20.00    Types: Cigarettes    Quit date: 10/23/1982  . Smokeless tobacco: Never Used  . Alcohol use 5.0 oz/week    10 drink(s) per week     Comment: wine with dinner  . Drug use: No  . Sexual activity: Yes    Partners: Female

## 2016-10-04 NOTE — Patient Instructions (Signed)

## 2016-10-04 NOTE — Procedures (Signed)
S1 Lumbosacral Transforaminal Epidural Steroid Injection - Sub-Pedicular Approach with Fluoroscopic Guidance   Patient: Jeremy Stone      Date of Birth: November 03, 1925 MRN: [id number] PCP: Irven Shelling, MD      Visit Date: 10/04/2016   Universal Protocol:    Date/Time: 10/04/1710:44 AM  Consent Given By: the patient  Position:  PRONE  Additional Comments: Vital signs were monitored before and after the procedure. Patient was prepped and draped in the usual sterile fashion. The correct patient, procedure, and site was verified.   Injection Procedure Details:  Procedure Site One Meds Administered:  Meds ordered this encounter  Medications  . lidocaine (PF) (XYLOCAINE) 1 % injection 0.3 mL  . methylPREDNISolone acetate (DEPO-MEDROL) injection 80 mg    Laterality: Left  Location/Site: Patient's S1 vertebral body is lumbarized S1 Foramen   Needle size: 22 G  Needle type: Spinal  Needle Placement: Transforaminal  Findings:  -Contrast Used: 1 mL iohexol 180 mg iodine/mL   -Comments: Excellent flow of contrast along the nerve and into the epidural space.  Procedure Details: After squaring off the sacral end-plate to get a true AP view, the C-arm was positioned so that the best possible view of the S1 foramen was visualized. The soft tissues overlying this structure were infiltrated with 2-3 ml. of 1% Lidocaine without Epinephrine.    The spinal needle was inserted toward the target using a "trajectory" view along the fluoroscope beam.  Under AP and lateral visualization, the needle was advanced so it did not puncture dura. Biplanar projections were used to confirm position. Aspiration was confirmed to be negative for CSF and/or blood. A 1-2 ml. volume of Isovue-250 was injected and flow of contrast was noted at each level. Radiographs were obtained for documentation purposes.   After attaining the desired flow of contrast documented above, a 0.5 to 1.0 ml test dose  of 0.25% Marcaine was injected into each respective transforaminal space.  The patient was observed for 90 seconds post injection.  After no sensory deficits were reported, and normal lower extremity motor function was noted,   the above injectate was administered so that equal amounts of the injectate were placed at each foramen (level) into the transforaminal epidural space.   Additional Comments:  The patient tolerated the procedure well Dressing: Band-Aid    Post-procedure details: Patient was observed during the procedure. Post-procedure instructions were reviewed.  Patient left the clinic in stable condition.

## 2016-10-10 ENCOUNTER — Encounter: Payer: Self-pay | Admitting: Pulmonary Disease

## 2016-10-10 ENCOUNTER — Telehealth: Payer: Self-pay | Admitting: Pulmonary Disease

## 2016-10-10 ENCOUNTER — Other Ambulatory Visit: Payer: Self-pay

## 2016-10-10 ENCOUNTER — Ambulatory Visit (INDEPENDENT_AMBULATORY_CARE_PROVIDER_SITE_OTHER): Payer: PPO | Admitting: Pulmonary Disease

## 2016-10-10 DIAGNOSIS — G4733 Obstructive sleep apnea (adult) (pediatric): Secondary | ICD-10-CM | POA: Diagnosis not present

## 2016-10-10 DIAGNOSIS — R011 Cardiac murmur, unspecified: Secondary | ICD-10-CM

## 2016-10-10 NOTE — Telephone Encounter (Signed)
Spoke with wife and gave her RA recc. She agreed to the order being placed. The order was placed nothing further is needed at this time.

## 2016-10-10 NOTE — Telephone Encounter (Signed)
Patient is returning phone call.  °

## 2016-10-10 NOTE — Telephone Encounter (Signed)
Patient called back state that he was to receive a prescription for a new CPAP machine, [facility] will provide a new machine once the prescription sent. Patient need to be fitted for a new mask in order to get a new prescription for the CPAP machine. First available appointment for the mask fitting is 10/30/2016. CPAP machine is leaking water, patient is unable to sleep.Jeremy Stone

## 2016-10-10 NOTE — Telephone Encounter (Signed)
Spoke with pt and advised him to bring his card from his machine so we can download first. Dr. Elsworth Soho wanted to review this before he would write the Rx for CPAP. Pt agreed to bring it by here this afternoon.

## 2016-10-10 NOTE — Telephone Encounter (Signed)
CPAP download reviewed for last 3 months- average pressure 12.5 cm Excellent compliance Please send prescription to DME for new CPAP 12 cm

## 2016-10-10 NOTE — Progress Notes (Signed)
   Subjective:    Patient ID: Jeremy Stone, male    DOB: 06/18/1926, 82 y.o.   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  PSG  04/2006 showed severe obstructive sleep apnea with AHI of 52 events per hour and lowest desaturation of 85%. This was corrected by CPAP of 14 cm with a large full face mask.    10/10/2016  Chief Complaint  Patient presents with  . Follow-up    Pt. uses a CPAP machine, Pt. uses it every night, Pt. states his mask is rubbing on his nose, DME: [facility]   He has been maintained on CPAP since 2007 good results and improvement in his daytime somnolence and fatigue. However over the past few months he has been having problems with his full face mask, he has developed irritation over the bridge of his nose, he could not use nasal pillows that he was provided on his last visit. His wife has improvised several nasal pads but they all seem to cause irritation. He has been unable to sleep well He feels that his overall health has been declining, he is developed hip pain for which he has taken an epidural and is taking low-dose Neurontin  He does not have his machine with him today so we cannot reviewed download He brought his last machine by himself and feels that it has been more than 5 years and wonders if he can obtain a new machine  No problems with pressure or dryness  He had an episode of atypical pneumonia in 2014 after return from [place], no diagnosis was ever obtained with Dr. Linda Hedges felt that this could be Legionella even though the test was negative.  He had an episode of chest pain 2 weeks ago for which EMS was called and EKG was normal. He reports dyspnea on usual activities but no pedal edema    Review of Systems neg for any significant sore throat, dysphagia, itching, sneezing, nasal congestion or excess/ purulent secretions, fever, chills, sweats, unintended wt loss, pleuritic or exertional cp, hempoptysis,  orthopnea pnd or change in chronic leg swelling. Also denies presyncope, palpitations, heartburn, abdominal pain, nausea, vomiting, diarrhea or change in bowel or urinary habits, dysuria,hematuria, rash, arthralgias, visual complaints, headache, numbness weakness or ataxia.     Objective:   Physical Exam  Gen. Pleasant, well-nourished, in no distress ENT - no lesions, no post nasal drip Neck: No JVD, no thyromegaly, no carotid bruits Lungs: no use of accessory muscles, no dullness to percussion, clear without rales or rhonchi  Cardiovascular: Rhythm regular, heart sounds  normal, 3/6 ejection systolic murmur at base, no peripheral edema Musculoskeletal: No deformities, no cyanosis or clubbing , Ambulates with a cane       Assessment & Plan:

## 2016-10-10 NOTE — Telephone Encounter (Signed)
Spoke with pt's wife, DL has been printed and placed in RA's look-at's. Pt was seen today 10/10/16 by RA.   Dr. Elsworth Soho please advise. Thanks.

## 2016-10-10 NOTE — Assessment & Plan Note (Signed)
Call [phone] to schedule appointment for mask fitting session  We will reviewed download once you provide [place] the card and based on this, we can write prescription for new CPAP machine  Weight loss encouraged, compliance with goal of at least 4-6 hrs every night is the expectation. Advised against medications with sedative side effects Cautioned against driving when sleepy - understanding that sleepiness will vary on a day to day basis

## 2016-10-10 NOTE — Assessment & Plan Note (Signed)
Schedule echocardiogram for murmur, r/o AS

## 2016-10-10 NOTE — Patient Instructions (Signed)
Call [phone] to schedule appointment for mask fitting session  We will reviewed download once you provide [place] the card and based on this, we can write prescription for new CPAP machine  Schedule echocardiogram for murmur

## 2016-10-10 NOTE — Telephone Encounter (Signed)
Pt. Wife was calling on the status of the download to see if RA has looked at it, I informed the pt. That RA is currently looking at pt. And he has not commented on it, as soon as he has taken a look at it we will contact them. Nothing further is needed at this time.

## 2016-10-12 ENCOUNTER — Ambulatory Visit (INDEPENDENT_AMBULATORY_CARE_PROVIDER_SITE_OTHER): Payer: PPO | Admitting: Sports Medicine

## 2016-10-16 ENCOUNTER — Encounter (INDEPENDENT_AMBULATORY_CARE_PROVIDER_SITE_OTHER): Payer: Self-pay | Admitting: Sports Medicine

## 2016-10-16 DIAGNOSIS — M48062 Spinal stenosis, lumbar region with neurogenic claudication: Secondary | ICD-10-CM

## 2016-10-16 DIAGNOSIS — M5116 Intervertebral disc disorders with radiculopathy, lumbar region: Secondary | ICD-10-CM

## 2016-10-19 ENCOUNTER — Encounter: Payer: Self-pay | Admitting: Pulmonary Disease

## 2016-10-26 ENCOUNTER — Ambulatory Visit (INDEPENDENT_AMBULATORY_CARE_PROVIDER_SITE_OTHER): Payer: PPO

## 2016-10-26 ENCOUNTER — Ambulatory Visit (INDEPENDENT_AMBULATORY_CARE_PROVIDER_SITE_OTHER): Payer: PPO | Admitting: Orthopaedic Surgery

## 2016-10-26 DIAGNOSIS — M25552 Pain in left hip: Secondary | ICD-10-CM | POA: Diagnosis not present

## 2016-10-26 DIAGNOSIS — M1612 Unilateral primary osteoarthritis, left hip: Secondary | ICD-10-CM | POA: Diagnosis not present

## 2016-10-26 NOTE — Progress Notes (Signed)
Office Visit Note   Patient: Jeremy Stone           Date of Birth: April 16, 1926           MRN: [id number] Visit Date: 10/26/2016              Requested by: Lavone Orn, MD [address] [place], [place] [address] PCP: Irven Shelling, MD   Assessment & Plan: Visit Diagnoses:  1. Pain in left hip   2. Unilateral primary osteoarthritis, left hip     Plan: At this point I would like to have him seen by Dr. Ernestina Patches here in the office for an intra-articular injection of a steroid in his left hip. He is seeing his pulmonologist next week. I do feel that he will be a candidate for an anterior hip replacement his left side and this may help him completely. I'll see him back myself after the injection by Dr. Ernestina Patches. I gave him a handout on hip replacement surgery expanded detail what this involves as well as gave him his hip x-rays.  Follow-Up Instructions: Return in about 4 weeks (around 11/23/2016).   Orders:  Orders Placed This Encounter  Procedures  . XR Pelvis 1-2 Views   No orders of the defined types were placed in this encounter.     Procedures: No procedures performed   Clinical Data: No additional findings.   Subjective: Chief Complaint  Patient presents with  . Left Hip - Pain, Follow-up    Patient states he is still having a lot of pain in left leg.    HPI He is a former patient of Dr. Paulla Fore that I'm seeing for the first time. He is also patient Dr. Ernestina Patches. He has severe spinal stenosis and his had injections by Dr. Ernestina Patches. The last probably wasn't at S1 and he says is really had no impact on his pain at all. He and relates with a walking stick. His pain is daily and is severe. Review of Systems   Objective: Vital Signs: There were no vitals taken for this visit.  Physical Exam  Ortho Exam Examination of his right hip is normal. Examination of his left hip actually shows a lot of pain with internal rotation rotation of that hip and  almost a deficit in rotation compared to the other side. Is quite painful all around the hip joint itself and over the trochanteric area. Specialty Comments:  MRI lumbar spine from 04/17/2016: Spontaneous arthrodesis of L2-L5 with severe spinal stenosis at L5-S1. Subarticular zone narrowing at left L2-L5 nerve roots with right-sided involvement as well. Findings do suggest ankylosing spondylitis when he does not have a diagnosis of this.  His symptoms almost spontaneous years resolved after the MRI was obtained & no epidural was performed.  Imaging: Xr Pelvis 1-2 Views  Result Date: 10/26/2016 An AP pelvis shows severe arthritis arthritis of his left hip. There is almost complete loss of joint space and significant sclerotic changes. This periarticular osteophytes as well. His right hip appears normal.    PMFS History: Patient Active Problem List   Diagnosis Date Noted  . Lumbar radiculopathy 10/04/2016  . Spinal stenosis of lumbar region with neurogenic claudication 10/04/2016  . Radiculopathy due to lumbar intervertebral disc disorder 10/04/2016  . Unspecified constipation 02/04/2014  . Rhinitis, non-allergic 10/23/2013  . PRIMARY HYPERCOAGULABLE STATE 08/18/2009  . CHRONIC PULMONARY EMBOLISM 08/09/2009  . HYPERTENSION 08/04/2009  . DRY MOUTH 06/09/2008  . CARDIAC MURMUR, SYSTOLIC [id number]  .  URINARY FREQUENCY 06/09/2008  . DEPRESSIVE DISORDER 04/01/2008  . Obstructive sleep apnea 04/01/2008  . INSOMNIA-SLEEP DISORDER-UNSPEC 04/01/2008  . DM, UNCOMPLICATED, TYPE II 0000000  . TRIGEMINAL NEURALGIA 07/08/2007  . HYPERLIPIDEMIA 07/01/2007  . GERD 07/01/2007   Past Medical History:  Diagnosis Date  . GERD (gastroesophageal reflux disease)   . Hyperlipidemia   . Hypertension   . NIDDM (non-insulin dependent diabetes mellitus)   . OSA (obstructive sleep apnea)    uses CPAP  . Thyroid nodule   . Trigeminal neuralgia     Family History  Problem Relation Age of Onset  .  Dementia Sister     Past Surgical History:  Procedure Laterality Date  . APPENDECTOMY    . TEE WITHOUT CARDIOVERSION N/A 05/01/2013   Procedure: TRANSESOPHAGEAL ECHOCARDIOGRAM (TEE);  Surgeon: Thayer Headings, MD;  Location: [place];  Service: Cardiovascular;  Laterality: N/A;  Need to make sure Car[facility] is arranged. Notifed RN at [employer] [facility] to do this on 04-30-2013  . trigeminal surgery     07-22-07- [facility]   Social History   Occupational History  . businessman    Social History Main Topics  . Smoking status: Former Smoker    Packs/day: 1.00    Years: 20.00    Types: Cigarettes    Quit date: 10/23/1982  . Smokeless tobacco: Never Used  . Alcohol use 5.0 oz/week    10 drink(s) per week     Comment: wine with dinner  . Drug use: No  . Sexual activity: Yes    Partners: Female

## 2016-10-30 ENCOUNTER — Other Ambulatory Visit (INDEPENDENT_AMBULATORY_CARE_PROVIDER_SITE_OTHER): Payer: Self-pay

## 2016-10-30 ENCOUNTER — Encounter: Payer: Self-pay | Admitting: Sports Medicine

## 2016-10-30 ENCOUNTER — Other Ambulatory Visit (HOSPITAL_BASED_OUTPATIENT_CLINIC_OR_DEPARTMENT_OTHER): Payer: PPO

## 2016-10-30 DIAGNOSIS — M25552 Pain in left hip: Secondary | ICD-10-CM

## 2016-11-01 ENCOUNTER — Other Ambulatory Visit (HOSPITAL_COMMUNITY): Payer: PPO

## 2016-11-06 ENCOUNTER — Encounter (INDEPENDENT_AMBULATORY_CARE_PROVIDER_SITE_OTHER): Payer: Self-pay | Admitting: Physical Medicine and Rehabilitation

## 2016-11-06 ENCOUNTER — Ambulatory Visit (INDEPENDENT_AMBULATORY_CARE_PROVIDER_SITE_OTHER): Payer: PPO | Admitting: Physical Medicine and Rehabilitation

## 2016-11-06 VITALS — BP 179/92 | HR 90

## 2016-11-06 DIAGNOSIS — M25552 Pain in left hip: Secondary | ICD-10-CM

## 2016-11-06 MED ORDER — TRIAMCINOLONE ACETONIDE 40 MG/ML IJ SUSP
80.0000 mg | INTRAMUSCULAR | Status: AC | PRN
  Administered 2016-11-06: 80 mg via INTRA_ARTICULAR

## 2016-11-06 MED ORDER — LIDOCAINE HCL 2 % IJ SOLN
4.0000 mL | INTRAMUSCULAR | Status: AC | PRN
  Administered 2016-11-06: 4 mL

## 2016-11-06 NOTE — Progress Notes (Signed)
Jeremy Stone - 83 y.o. male MRN [id number]  Date of birth: 19-Jul-1926  Office Visit Note: Visit Date: 11/06/2016 PCP: Irven Shelling, MD Referred by: Lavone Orn, MD  Subjective: Chief Complaint  Patient presents with  . Left Hip - Pain   HPI: Jeremy Stone is an 83 year old gentleman has been followed by Dr. Paulla Fore and also Dr. Ninfa Linden. He states he had no relief with last injection which was an epidural injection. Pain is in left hip into groin and down to knee. Constant pain with movement. Ok with sitting. Ambulates with cane. Dr. Ninfa Linden requests a diagnostic and therapeutic anesthetic hip arthrogram.    ROS Otherwise per HPI.  Assessment & Plan: Visit Diagnoses:  1. Pain in left hip     Plan: Findings:  Diagnostic and therapeutic left hip anesthetic arthrogram.    Meds & Orders: No orders of the defined types were placed in this encounter.   Orders Placed This Encounter  Procedures  . Large Joint Injection/Arthrocentesis    Follow-up: Return for Schedule Dr. Ninfa Linden..   Procedures: Anesthetic hip arthrogram Date/Time: 11/06/2016 8:53 AM Performed by: Magnus Sinning Authorized by: Magnus Sinning   Consent Given by:  Patient Site marked: the procedure site was marked   Timeout: prior to procedure the correct patient, procedure, and site was verified   Indications:  Pain and diagnostic evaluation Location:  Hip Site:  L hip joint Prep: patient was prepped and draped in usual sterile fashion   Needle Size:  22 G Needle Length:  3.5 inches Approach:  Anterior Ultrasound Guidance: No   Fluoroscopic Guidance: No   Arthrogram: Yes   Medications:  4 mL lidocaine 2 %; 80 mg triamcinolone acetonide 40 MG/ML Aspiration Attempted: Yes   Patient tolerance:  Patient tolerated the procedure well with no immediate complications  Arthrogram demonstrated excellent flow of contrast throughout the joint surface without extravasation or obvious defect.  The  patient had relief of symptoms during the anesthetic phase of the injection.     No notes on file   Clinical History: No specialty comments available.  He reports that he quit smoking about 34 years ago. His smoking use included Cigarettes. He has a 20.00 pack-year smoking history. He has never used smokeless tobacco. No results for input(s): HGBA1C, LABURIC in the last 8760 hours.  Objective:  VS:  HT:    WT:   BMI:     BP:(!) 179/92  HR:90bpm  TEMP: ( )  RESP:97 % Physical Exam  Musculoskeletal:  Pain with internal rotation of the left hip. Good distal strength. He ambulates with a cane. He has a forward flexed spine.    Ortho Exam Imaging: No results found.  Past Medical/Family/Surgical/Social History: Medications & Allergies reviewed per EMR Patient Active Problem List   Diagnosis Date Noted  . Lumbar radiculopathy 10/04/2016  . Spinal stenosis of lumbar region with neurogenic claudication 10/04/2016  . Radiculopathy due to lumbar intervertebral disc disorder 10/04/2016  . Unspecified constipation 02/04/2014  . Rhinitis, non-allergic 10/23/2013  . PRIMARY HYPERCOAGULABLE STATE 08/18/2009  . CHRONIC PULMONARY EMBOLISM 08/09/2009  . HYPERTENSION 08/04/2009  . DRY MOUTH 06/09/2008  . CARDIAC MURMUR, SYSTOLIC [id number]  . URINARY FREQUENCY 06/09/2008  . DEPRESSIVE DISORDER 04/01/2008  . Obstructive sleep apnea 04/01/2008  . INSOMNIA-SLEEP DISORDER-UNSPEC 04/01/2008  . DM, UNCOMPLICATED, TYPE II 0000000  . TRIGEMINAL NEURALGIA 07/08/2007  . HYPERLIPIDEMIA 07/01/2007  . GERD 07/01/2007   Past Medical History:  Diagnosis Date  . GERD (gastroesophageal reflux disease)   .  Hyperlipidemia   . Hypertension   . NIDDM (non-insulin dependent diabetes mellitus)   . OSA (obstructive sleep apnea)    uses CPAP  . Thyroid nodule   . Trigeminal neuralgia    Family History  Problem Relation Age of Onset  . Dementia Sister    Past Surgical History:  Procedure  Laterality Date  . APPENDECTOMY    . TEE WITHOUT CARDIOVERSION N/A 05/01/2013   Procedure: TRANSESOPHAGEAL ECHOCARDIOGRAM (TEE);  Surgeon: Thayer Headings, MD;  Location: [place];  Service: Cardiovascular;  Laterality: N/A;  Need to make sure [facility] is arranged. Notifed RN at [facility] to do this on 04-30-2013  . trigeminal surgery     07-22-07- [facility]   Social History   Occupational History  . businessman    Social History Main Topics  . Smoking status: Former Smoker    Packs/day: 1.00    Years: 20.00    Types: Cigarettes    Quit date: 10/23/1982  . Smokeless tobacco: Never Used  . Alcohol use 5.0 oz/week    10 drink(s) per week     Comment: wine with dinner  . Drug use: No  . Sexual activity: Yes    Partners: Female

## 2016-11-06 NOTE — Patient Instructions (Signed)

## 2016-11-20 ENCOUNTER — Ambulatory Visit (HOSPITAL_BASED_OUTPATIENT_CLINIC_OR_DEPARTMENT_OTHER): Payer: PPO | Attending: Internal Medicine | Admitting: Radiology

## 2016-11-22 ENCOUNTER — Ambulatory Visit (INDEPENDENT_AMBULATORY_CARE_PROVIDER_SITE_OTHER): Payer: PPO | Admitting: Orthopaedic Surgery

## 2016-11-22 DIAGNOSIS — M25552 Pain in left hip: Secondary | ICD-10-CM

## 2016-11-22 DIAGNOSIS — M1612 Unilateral primary osteoarthritis, left hip: Secondary | ICD-10-CM | POA: Diagnosis not present

## 2016-11-22 NOTE — Progress Notes (Signed)
The patient is a 83 year old gentleman who is following up after intra-articular injection in his left hip joint to treat painful arthritis. He said the injection is helped well. He is wanting to proceed with hip replacement surgery however he wants to wait until flu season is getting past as. He is 21 and he has sleep apnea.  I can put his left hip through range of motion and it does hurt but is less painful than when I first saw him although it does have severe osteoarthritis.  I will see him back in a month to see how is doing overall well but have a better idea of how he is doing with the injection as well as if he is ready to proceed with surgery as flu season is wanting down.

## 2016-11-29 DIAGNOSIS — G4733 Obstructive sleep apnea (adult) (pediatric): Secondary | ICD-10-CM | POA: Diagnosis not present

## 2016-12-04 ENCOUNTER — Other Ambulatory Visit: Payer: Self-pay | Admitting: Dermatology

## 2016-12-04 DIAGNOSIS — D492 Neoplasm of unspecified behavior of bone, soft tissue, and skin: Secondary | ICD-10-CM | POA: Diagnosis not present

## 2016-12-04 DIAGNOSIS — D044 Carcinoma in situ of skin of scalp and neck: Secondary | ICD-10-CM | POA: Diagnosis not present

## 2016-12-04 DIAGNOSIS — L570 Actinic keratosis: Secondary | ICD-10-CM | POA: Diagnosis not present

## 2016-12-06 ENCOUNTER — Other Ambulatory Visit: Payer: Self-pay

## 2016-12-06 ENCOUNTER — Encounter (HOSPITAL_COMMUNITY): Payer: Self-pay | Admitting: *Deleted

## 2016-12-06 ENCOUNTER — Ambulatory Visit (HOSPITAL_COMMUNITY): Payer: PPO | Attending: Cardiology

## 2016-12-06 ENCOUNTER — Telehealth: Payer: Self-pay | Admitting: Pulmonary Disease

## 2016-12-06 DIAGNOSIS — E119 Type 2 diabetes mellitus without complications: Secondary | ICD-10-CM | POA: Diagnosis not present

## 2016-12-06 DIAGNOSIS — I351 Nonrheumatic aortic (valve) insufficiency: Secondary | ICD-10-CM | POA: Diagnosis not present

## 2016-12-06 DIAGNOSIS — E785 Hyperlipidemia, unspecified: Secondary | ICD-10-CM | POA: Diagnosis not present

## 2016-12-06 DIAGNOSIS — Z87891 Personal history of nicotine dependence: Secondary | ICD-10-CM | POA: Diagnosis not present

## 2016-12-06 DIAGNOSIS — I10 Essential (primary) hypertension: Secondary | ICD-10-CM | POA: Insufficient documentation

## 2016-12-06 DIAGNOSIS — R011 Cardiac murmur, unspecified: Secondary | ICD-10-CM | POA: Diagnosis not present

## 2016-12-06 DIAGNOSIS — G4733 Obstructive sleep apnea (adult) (pediatric): Secondary | ICD-10-CM | POA: Insufficient documentation

## 2016-12-06 HISTORY — PX: TRANSESOPHAGEAL ECHOCARDIOGRAM W/O CARDIOVERSION: SHX6139

## 2016-12-06 LAB — ECHOCARDIOGRAM COMPLETE
AOVTI: 88.4 cm
AV Mean grad: 32 mmHg
AV VEL mean LVOT/AV: 0.29
AV pk vel: 370 cm/s
AVPG: 55 mmHg
Ao pk vel: 0.29 m/s
CHL CUP DOP CALC LVOT VTI: 25.9 cm
DOP CAL AO MEAN VELOCITY: 264 cm/s
E decel time: 335 msec
EERAT: 16.01
LA vol A4C: 22.7 ml
LA vol index: 15.3 mL/m2
LAVOL: 31.2 mL
LV E/e'average: 16.01
LV TDI E'LATERAL: 6.31
LVEEMED: 16.01
LVELAT: 6.31 cm/s
LVOT peak VTI: 0.29 cm
LVOT peak vel: 107 cm/s
MV Dec: 335
MV Peak grad: 4 mmHg
MV pk A vel: 160 m/s
MV pk E vel: 101 m/s
P 1/2 time: 307 ms
RV LATERAL S' VELOCITY: 17 cm/s
RV TAPSE: 21.1 mm
TDI e' medial: 5.44

## 2016-12-06 NOTE — Progress Notes (Unsigned)
2D Echo Performed.  Technically Difficult Images.  Parasternal Images shadowed by lungs and ribs, and patient is unable to lay on left side due to left hip pain (pre-op replacement)  he is also unable to tolerate laying any longer, so pedoff probe velocities were not acquired.  Jeremy Stone does not have any complaints or symptoms other than the left hip pain, and is stable during exam.    Deliah Boston, RDCS

## 2016-12-06 NOTE — Progress Notes (Signed)
Okay Needed a good look at the aortic valve-has murmur of aortic stenosis

## 2016-12-06 NOTE — Telephone Encounter (Signed)
Notes Recorded by Rigoberto Noel, MD on 12/06/2016 at 4:17 PM EST Moderate stenosis of the aortic valve- no change in treatment at this time ----------------------------------- Spoke with Jeremy Stone. He is aware of results. Nothing further was needed.

## 2016-12-11 ENCOUNTER — Ambulatory Visit: Payer: PPO | Admitting: Adult Health

## 2016-12-12 DIAGNOSIS — G4733 Obstructive sleep apnea (adult) (pediatric): Secondary | ICD-10-CM | POA: Diagnosis not present

## 2016-12-13 ENCOUNTER — Ambulatory Visit (INDEPENDENT_AMBULATORY_CARE_PROVIDER_SITE_OTHER): Payer: PPO | Admitting: Acute Care

## 2016-12-13 ENCOUNTER — Encounter: Payer: Self-pay | Admitting: Acute Care

## 2016-12-13 DIAGNOSIS — G4733 Obstructive sleep apnea (adult) (pediatric): Secondary | ICD-10-CM

## 2016-12-13 NOTE — Progress Notes (Signed)
Patient here too soon for CPAP f/u.  Appt rescheduled to 01/03/17. Virl Cagey, CMA

## 2016-12-20 ENCOUNTER — Ambulatory Visit (INDEPENDENT_AMBULATORY_CARE_PROVIDER_SITE_OTHER): Payer: PPO | Admitting: Orthopaedic Surgery

## 2016-12-20 DIAGNOSIS — M1612 Unilateral primary osteoarthritis, left hip: Secondary | ICD-10-CM

## 2016-12-20 DIAGNOSIS — M25552 Pain in left hip: Secondary | ICD-10-CM

## 2016-12-20 NOTE — Progress Notes (Signed)
The patient is here today having had an intra-articular steroid injection by Dr. Ernestina Patches in his left hip. He said that injection did fabulous however his pain is coming back slowly and is severe. He has known well-documented severe primary osteo arthritis of his left hip. His x-rays as showing significant peri-articular osteophytes with joint space narrowing, loss the superior lateral joint space narrowing, and significant sclerotic and cystic changes. There is shortening of his left side. He has a significant Trendelenburg gait. He walks with assistive device. His pain is daily, it is 10 out of 10, it is detrimental effect his activities daily living, his quality of life, and his mobility. At this point he does wish proceed with a total hip arthroplasty.  With a long thorough discussion about hip replacement surgery. We had a thorough discussion of the risk moves to the surgery. He has a handout on hip replaced surgery and we showed him his x-rays and his model and explained in detail what is intraoperative and postoperative course would be. He does wish proceed with this sometime in the near future. We would then see him back in 2 weeks postoperative but no x-rays of be needed. If there is any other questions he has he'll let us know. All questions were encouraged and answered

## 2016-12-27 DIAGNOSIS — M1612 Unilateral primary osteoarthritis, left hip: Secondary | ICD-10-CM | POA: Diagnosis not present

## 2016-12-27 DIAGNOSIS — E119 Type 2 diabetes mellitus without complications: Secondary | ICD-10-CM | POA: Diagnosis not present

## 2016-12-27 DIAGNOSIS — G4733 Obstructive sleep apnea (adult) (pediatric): Secondary | ICD-10-CM | POA: Diagnosis not present

## 2016-12-27 DIAGNOSIS — I10 Essential (primary) hypertension: Secondary | ICD-10-CM | POA: Diagnosis not present

## 2016-12-27 DIAGNOSIS — Z7984 Long term (current) use of oral hypoglycemic drugs: Secondary | ICD-10-CM | POA: Diagnosis not present

## 2017-01-03 ENCOUNTER — Encounter: Payer: Self-pay | Admitting: Adult Health

## 2017-01-03 ENCOUNTER — Ambulatory Visit (INDEPENDENT_AMBULATORY_CARE_PROVIDER_SITE_OTHER): Payer: PPO | Admitting: Adult Health

## 2017-01-03 DIAGNOSIS — G4733 Obstructive sleep apnea (adult) (pediatric): Secondary | ICD-10-CM | POA: Diagnosis not present

## 2017-01-03 NOTE — Assessment & Plan Note (Signed)
Well controlled on C Pap  Plan  Patient Instructions  Continue on CPAP At bedtime  .  Keep up good work  Follow up with Dr. Elsworth Soho  In 1 year and As needed

## 2017-01-03 NOTE — Patient Instructions (Signed)
Continue on CPAP At bedtime  .  Keep up good work  Follow up with Dr. Elsworth Soho  In 1 year and As needed

## 2017-01-03 NOTE — Progress Notes (Signed)
@Patient  ID: Jeremy Stone, male    DOB: [birth year], 83 y.o.   MRN: [id number]  Chief Complaint  Patient presents with  . 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   TEST  PSG  04/2006 showed severe obstructive sleep apnea with AHI of 52 events per hour and lowest desaturation of 85%. This was corrected by CPAP of 14 cm with a large full face mask.   01/03/2017 Follow up : OSA  Patient presents for a three-month follow-up.. Patient is recently gotten a new C Pap machine. Says that he is working very nicely for him. Download shows excellent compliance with average usage at 7.5 hours. He is on a set pressure of 12 cm of H2O. AHI is 3.6. Minimal leaks. he feels rested.  Patient says that he is planning on upcoming hip surgery date has not been set yet. We discussed that he will need to take his C Pap machine for his hospital stay.    Allergies  Allergen Reactions  . Lactose Intolerance (Gi)   . Sulfa Antibiotics Other (See Comments)    Immunization History  Administered Date(s) Administered  . Influenza Split 08/23/2011, 07/09/2012  . Influenza Whole 07/23/1998, 08/05/2008, 08/04/2009, 07/22/2010  . Influenza, High Dose Seasonal PF 07/24/2016  . Influenza,inj,Quad PF,36+ Mos 07/30/2013, 08/14/2014, 08/06/2015  . Pneumococcal Polysaccharide-23 05/30/2013  . Td 10/24/1991, 12/23/2006  . Zoster 08/16/2006    Past Medical History:  Diagnosis Date  . GERD (gastroesophageal reflux disease)   . Hyperlipidemia   . Hypertension   . NIDDM (non-insulin dependent diabetes mellitus)   . OSA (obstructive sleep apnea)    uses CPAP  . Thyroid nodule   . Trigeminal neuralgia     Tobacco History: History  Smoking Status  . Former Smoker  . Packs/day: 1.00  . Years: 20.00  . Types: Cigarettes  . Quit date: 10/23/1982  Smokeless Tobacco  . Never Used   Counseling given: Not  Answered   Outpatient Encounter Prescriptions as of 01/03/2017  Medication Sig  . amLODipine (NORVASC) 5 MG tablet Take 1 tablet (5 mg total) by mouth daily.  Marland Kitchen aspirin 81 MG tablet Take 81 mg by mouth daily.  . Coenzyme Q10 (CO Q-10) 100 MG CAPS Take by mouth daily.  . Cyanocobalamin (VITAMIN B 12 PO) Take by mouth daily.  . fluticasone (FLONASE) 50 MCG/ACT nasal spray Place 2 sprays into both nostrils daily.  Marland Kitchen ibuprofen (ADVIL,MOTRIN) 200 MG tablet Take 200 mg by mouth every 6 (six) hours as needed for pain.  . MECLIZINE HCL PO Take by mouth.  . metFORMIN (GLUCOPHAGE) 500 MG tablet Take 1 tablet (500 mg total) by mouth 2 (two) times daily with a meal.  . traMADol (ULTRAM) 50 MG tablet Take 1 tablet (50 mg total) by mouth every 6 (six) hours as needed for moderate pain.  . [DISCONTINUED] pilocarpine (SALAGEN) 5 MG tablet Take 1 tablet (5 mg total) by mouth 2 (two) times daily. (Patient not taking: Reported on 12/20/2016)  . [DISCONTINUED] tamsulosin (FLOMAX) 0.4 MG CAPS capsule Take 1 capsule (0.4 mg total) by mouth daily after supper. (Patient not taking: Reported on 12/20/2016)   Facility-Administered Encounter Medications as of 01/03/2017  Medication  . lidocaine (PF) (XYLOCAINE) 1 % injection 0.3 mL     Review of Systems  Constitutional:   No  weight loss, night sweats,  Fevers, chills, fatigue, or  lassitude.  HEENT:   No headaches,  Difficulty swallowing,  Tooth/dental problems, or  Sore throat,                No sneezing, itching, ear ache, nasal congestion, post nasal drip,   CV:  No chest pain,  Orthopnea, PND, swelling in lower extremities, anasarca, dizziness, palpitations, syncope.   GI  No heartburn, indigestion, abdominal pain, nausea, vomiting, diarrhea, change in bowel habits, loss of appetite, bloody stools.   Resp: No shortness of breath with exertion or at rest.  No excess mucus, no productive cough,  No non-productive cough,  No coughing up of blood.  No change in  color of mucus.  No wheezing.  No chest wall deformity  Skin: no rash or lesions.  GU: no dysuria, change in color of urine, no urgency or frequency.  No flank pain, no hematuria       Physical Exam  BP 124/70   Pulse 89   Ht 5\' 9"  (1.753 m)   Wt 188 lb 3.2 oz (85.4 kg)   SpO2 99%   BMI 27.79 kg/m   GEN: A/Ox3; pleasant , NAD, well nourished    HEENT:  [place]/AT,  EACs-clear, TMs-wnl, NOSE-clear, THROAT-clear, no lesions, no postnasal drip or exudate noted.   NECK:  Supple w/ fair ROM; no JVD; normal carotid impulses w/o bruits; no thyromegaly or nodules palpated; no lymphadenopathy.    RESP  Clear  P & A; w/o, wheezes/ rales/ or rhonchi. no accessory muscle use, no dullness to percussion  CARD:  RRR, +2/6 SM  no peripheral edema, pulses intact, no cyanosis or clubbing.  GI:   Soft & nt; nml bowel sounds; no organomegaly or masses detected.   Musco: Warm bil, no deformities or joint swelling noted.   Neuro: alert, no focal deficits noted.    Skin: Warm, no lesions or rashes    Lab Results:  BNP No results found for: BNP  ProBNP No results found for: PROBNP  Imaging: No results found.   Assessment & Plan:   Obstructive sleep apnea Well controlled on C Pap  Plan  Patient Instructions  Continue on CPAP At bedtime  .  Keep up good work  Follow up with Dr. Elsworth Soho  In 1 year and As needed          Rexene Edison, NP 01/03/2017

## 2017-01-06 NOTE — Progress Notes (Signed)
Reviewed & agree with plan  

## 2017-01-19 ENCOUNTER — Other Ambulatory Visit (INDEPENDENT_AMBULATORY_CARE_PROVIDER_SITE_OTHER): Payer: Self-pay | Admitting: Physician Assistant

## 2017-01-22 ENCOUNTER — Encounter (HOSPITAL_COMMUNITY): Payer: Self-pay | Admitting: *Deleted

## 2017-01-23 ENCOUNTER — Other Ambulatory Visit (HOSPITAL_COMMUNITY): Payer: Self-pay | Admitting: Emergency Medicine

## 2017-01-23 NOTE — Progress Notes (Signed)
LOV/ pulmonology clearance 01-03-17 Parret NP on chart ECHO 12-06-16 epic

## 2017-01-23 NOTE — Patient Instructions (Addendum)
Jeremy Stone  01/23/2017   Your procedure is scheduled on: 02-02-17  Report to [facility] Main  Entrance take [facility]  elevators to 3rd floor to  [place] at 530AM.  Call this number if you have problems the morning of surgery [phone]   Remember: ONLY 1 PERSON MAY GO WITH YOU TO SHORT STAY TO GET  READY MORNING OF [place].  Do not eat food or drink liquids :After Midnight.   Bring cpap mask and tubing . Device will be provided!   Take these medicines the morning of surgery with A SIP OF WATER: amlodipine(norvasc), tramadol as needed                                You may not have any metal on your body including hair pins and              piercings  Do not wear jewelry, make-up, lotions, powders or perfumes, deodorant                Men may shave face and neck.   Do not bring valuables to the hospital. [place].  Contacts, dentures or bridgework may not be worn into surgery.  Leave suitcase in the car. After surgery it may be brought to your room.              Please read over the following fact sheets you were given: _____________________________________________________________________             How to Manage Your Diabetes Before and After Surgery  Why is it important to control my blood sugar before and after surgery? . Improving blood sugar levels before and after surgery helps healing and can limit problems. . A way of improving blood sugar control is eating a healthy diet by: o  Eating less sugar and carbohydrates o  Increasing activity/exercise o  Talking with your doctor about reaching your blood sugar goals . High blood sugars (greater than 180 mg/dL) can raise your risk of infections and slow your recovery, so you will need to focus on controlling your diabetes during the weeks before surgery. . Make sure that the doctor who takes care of your diabetes knows about your  planned surgery including the date and location.   WHAT DO I DO ABOUT MY DIABETES MEDICATION?  Marland Kitchen Do not take oral diabetes medicines (pills) the morning of surgery.  . THE DAY BEFORE SURGERY, take  METFORMIN as usual      . THE MORNING OF SURGERY, DO NOT TAKE ANY METFORMIN    Patient Signature:  Date:   Nurse Signature:  Date:   Reviewed and Endorsed by [facility] Patient Education Committee, August 2015  [facility] - Preparing for Surgery Before surgery, you can play an important role.  Because skin is not sterile, your skin needs to be as free of germs as possible.  You can reduce the number of germs on your skin by washing with CHG (chlorahexidine gluconate) soap before surgery.  CHG is an antiseptic cleaner which kills germs and bonds with the skin to continue killing germs even after washing. Please DO NOT use if you have an allergy to CHG or antibacterial soaps.  If your skin becomes reddened/irritated stop using the CHG and inform your nurse when you arrive at Short Stay. Do not shave (including legs and underarms) for at least 48 hours prior to the first CHG shower.  You may shave your face/neck. Please follow these instructions carefully:  1.  Shower with CHG Soap the night before surgery and the  morning of Surgery.  2.  If you choose to wash your hair, wash your hair first as usual with your  normal  shampoo.  3.  After you shampoo, rinse your hair and body thoroughly to remove the  shampoo.                           4.  Use CHG as you would any other liquid soap.  You can apply chg directly  to the skin and wash                       Gently with a scrungie or clean washcloth.  5.  Apply the CHG Soap to your body ONLY FROM THE NECK DOWN.   Do not use on face/ open                           Wound or open sores. Avoid contact with eyes, ears mouth and genitals (private parts).                       Wash face,  Genitals (private parts) with your normal soap.             6.   Wash thoroughly, paying special attention to the area where your surgery  will be performed.  7.  Thoroughly rinse your body with warm water from the neck down.  8.  DO NOT shower/wash with your normal soap after using and rinsing off  the CHG Soap.                9.  Pat yourself dry with a clean towel.            10.  Wear clean pajamas.            11.  Place clean sheets on your bed the night of your first shower and do not  sleep with pets. Day of Surgery : Do not apply any lotions/deodorants the morning of surgery.  Please wear clean clothes to the hospital/surgery center.  FAILURE TO FOLLOW THESE INSTRUCTIONS MAY RESULT IN THE CANCELLATION OF YOUR SURGERY   ________________________________________________________________________   Incentive Spirometer  An incentive spirometer is a tool that can help keep your lungs clear and active. This tool measures how well you are filling your lungs with each breath. Taking long deep breaths may help reverse or decrease the chance of developing breathing (pulmonary) problems (especially infection) following:  A long period of time when you are unable to move or be active. BEFORE THE PROCEDURE   If the spirometer includes an indicator to show your best effort, your nurse or respiratory therapist will set it to a desired goal.  If possible, sit up straight or lean slightly forward. Try not to slouch.  Hold the incentive spirometer in an upright position. INSTRUCTIONS FOR USE  1. Sit on the edge of your bed if possible, or sit up as far as you can in bed or on a chair. 2. Hold the  incentive spirometer in an upright position. 3. Breathe out normally. 4. Place the mouthpiece in your mouth and seal your lips tightly around it. 5. Breathe in slowly and as deeply as possible, raising the piston or the ball toward the top of the column. 6. Hold your breath for 3-5 seconds or for as long as possible. Allow the piston or ball to fall to the bottom of  the column. 7. Remove the mouthpiece from your mouth and breathe out normally. 8. Rest for a few seconds and repeat Steps 1 through 7 at least 10 times every 1-2 hours when you are awake. Take your time and take a few normal breaths between deep breaths. 9. The spirometer may include an indicator to show your best effort. Use the indicator as a goal to work toward during each repetition. 10. After each set of 10 deep breaths, practice coughing to be sure your lungs are clear. If you have an incision (the cut made at the time of surgery), support your incision when coughing by placing a pillow or rolled up towels firmly against it. Once you are able to get out of bed, walk around indoors and cough well. You may stop using the incentive spirometer when instructed by your caregiver.  RISKS AND COMPLICATIONS  Take your time so you do not get dizzy or light-headed.  If you are in pain, you may need to take or ask for pain medication before doing incentive spirometry. It is harder to take a deep breath if you are having pain. AFTER USE  Rest and breathe slowly and easily.  It can be helpful to keep track of a log of your progress. Your caregiver can provide you with a simple table to help with this. If you are using the spirometer at home, follow these instructions: [place] IF:   You are having difficultly using the spirometer.  You have trouble using the spirometer as often as instructed.  Your pain medication is not giving enough relief while using the spirometer.  You develop fever of 100.5 F (38.1 C) or higher. SEEK IMMEDIATE MEDICAL CARE IF:   You cough up bloody sputum that had not been present before.  You develop fever of 102 F (38.9 C) or greater.  You develop worsening pain at or near the incision site. MAKE SURE YOU:   Understand these instructions.  Will watch your condition.  Will get help right away if you are not doing well or get worse. Document  Released: 02/19/2007 Document Revised: 01/01/2012 Document Reviewed: 04/22/2007 [facility] Patient Information 2014 [place], [place].   ________________________________________________________________________

## 2017-01-26 ENCOUNTER — Encounter (HOSPITAL_COMMUNITY)
Admission: RE | Admit: 2017-01-26 | Discharge: 2017-01-26 | Disposition: A | Discharge status: Home / Self Care | Payer: PPO | Source: Ambulatory Visit | Attending: Orthopaedic Surgery | Admitting: Orthopaedic Surgery

## 2017-01-26 ENCOUNTER — Encounter (HOSPITAL_COMMUNITY): Payer: Self-pay

## 2017-01-26 DIAGNOSIS — Z01812 Encounter for preprocedural laboratory examination: Secondary | ICD-10-CM | POA: Insufficient documentation

## 2017-01-26 DIAGNOSIS — E119 Type 2 diabetes mellitus without complications: Secondary | ICD-10-CM | POA: Insufficient documentation

## 2017-01-26 DIAGNOSIS — Z01810 Encounter for preprocedural cardiovascular examination: Secondary | ICD-10-CM | POA: Diagnosis not present

## 2017-01-26 DIAGNOSIS — I10 Essential (primary) hypertension: Secondary | ICD-10-CM | POA: Diagnosis not present

## 2017-01-26 LAB — BASIC METABOLIC PANEL
Anion gap: 6 (ref 5–15)
BUN: 22 mg/dL — ABNORMAL HIGH (ref 6–20)
CHLORIDE: 108 mmol/L (ref 101–111)
CO2: 27 mmol/L (ref 22–32)
Calcium: 9.2 mg/dL (ref 8.9–10.3)
Creatinine, Ser: 1.16 mg/dL (ref 0.61–1.24)
GFR calc Af Amer: >60 mL/min (ref >60)
GFR calc non Af Amer: 54 mL/min — ABNORMAL LOW (ref >60)
Glucose, Bld: 121 mg/dL — ABNORMAL HIGH (ref 65–99)
Potassium: 3.8 mmol/L (ref 3.5–5.1)
Sodium: 141 mmol/L (ref 135–145)

## 2017-01-26 LAB — CBC
HCT: 41.3 % (ref 39.0–52.0)
HEMOGLOBIN: 14 g/dL (ref 13.0–17.0)
MCH: 32.7 pg (ref 26.0–34.0)
MCHC: 33.9 g/dL (ref 30.0–36.0)
MCV: 96.5 fL (ref 78.0–100.0)
Platelets: 296 10*3/uL (ref 150–400)
RBC: 4.28 MIL/uL (ref 4.22–5.81)
RDW: 13.1 % (ref 11.5–15.5)
WBC: 9.1 10*3/uL (ref 4.0–10.5)

## 2017-01-26 LAB — ABO/RH: ABO/RH(D): O NEG

## 2017-01-26 LAB — SURGICAL PCR SCREEN
MRSA, PCR: NEGATIVE
Staphylococcus aureus: NEGATIVE

## 2017-01-26 LAB — GLUCOSE, CAPILLARY: Glucose-Capillary: 121 mg/dL — ABNORMAL HIGH (ref 65–99)

## 2017-01-27 DIAGNOSIS — G4733 Obstructive sleep apnea (adult) (pediatric): Secondary | ICD-10-CM | POA: Diagnosis not present

## 2017-01-27 LAB — HEMOGLOBIN A1C
HEMOGLOBIN A1C: 6.4 % — AB (ref 4.8–5.6)
MEAN PLASMA GLUCOSE: 137 mg/dL

## 2017-02-02 ENCOUNTER — Inpatient Hospital Stay (HOSPITAL_COMMUNITY): Payer: PPO

## 2017-02-02 ENCOUNTER — Other Ambulatory Visit (INDEPENDENT_AMBULATORY_CARE_PROVIDER_SITE_OTHER): Payer: Self-pay | Admitting: Orthopaedic Surgery

## 2017-02-02 ENCOUNTER — Inpatient Hospital Stay (HOSPITAL_COMMUNITY)
Admission: RE | Admit: 2017-02-02 | Discharge: 2017-02-03 | DRG: 470 | Disposition: A | Discharge status: Home Health | Payer: PPO | Source: Ambulatory Visit | Attending: Orthopaedic Surgery | Admitting: Orthopaedic Surgery

## 2017-02-02 ENCOUNTER — Inpatient Hospital Stay (HOSPITAL_COMMUNITY): Payer: PPO | Admitting: Certified Registered Nurse Anesthetist

## 2017-02-02 ENCOUNTER — Encounter (HOSPITAL_COMMUNITY): Payer: Self-pay | Admitting: Certified Registered Nurse Anesthetist

## 2017-02-02 ENCOUNTER — Encounter (HOSPITAL_COMMUNITY)
Admission: RE | Disposition: A | Discharge status: Home Health | Payer: Self-pay | Source: Ambulatory Visit | Attending: Orthopaedic Surgery

## 2017-02-02 DIAGNOSIS — E119 Type 2 diabetes mellitus without complications: Secondary | ICD-10-CM | POA: Diagnosis present

## 2017-02-02 DIAGNOSIS — E785 Hyperlipidemia, unspecified: Secondary | ICD-10-CM | POA: Diagnosis present

## 2017-02-02 DIAGNOSIS — Z7984 Long term (current) use of oral hypoglycemic drugs: Secondary | ICD-10-CM | POA: Diagnosis not present

## 2017-02-02 DIAGNOSIS — M1612 Unilateral primary osteoarthritis, left hip: Secondary | ICD-10-CM | POA: Diagnosis not present

## 2017-02-02 DIAGNOSIS — Z96642 Presence of left artificial hip joint: Secondary | ICD-10-CM

## 2017-02-02 DIAGNOSIS — I350 Nonrheumatic aortic (valve) stenosis: Secondary | ICD-10-CM | POA: Diagnosis not present

## 2017-02-02 DIAGNOSIS — Z87891 Personal history of nicotine dependence: Secondary | ICD-10-CM | POA: Diagnosis not present

## 2017-02-02 DIAGNOSIS — M25552 Pain in left hip: Secondary | ICD-10-CM | POA: Diagnosis not present

## 2017-02-02 DIAGNOSIS — Z79899 Other long term (current) drug therapy: Secondary | ICD-10-CM | POA: Diagnosis not present

## 2017-02-02 DIAGNOSIS — I10 Essential (primary) hypertension: Secondary | ICD-10-CM | POA: Diagnosis present

## 2017-02-02 DIAGNOSIS — K219 Gastro-esophageal reflux disease without esophagitis: Secondary | ICD-10-CM | POA: Diagnosis present

## 2017-02-02 DIAGNOSIS — Z7951 Long term (current) use of inhaled steroids: Secondary | ICD-10-CM | POA: Diagnosis not present

## 2017-02-02 DIAGNOSIS — Z7982 Long term (current) use of aspirin: Secondary | ICD-10-CM

## 2017-02-02 DIAGNOSIS — G4733 Obstructive sleep apnea (adult) (pediatric): Secondary | ICD-10-CM | POA: Diagnosis not present

## 2017-02-02 DIAGNOSIS — Z419 Encounter for procedure for purposes other than remedying health state, unspecified: Secondary | ICD-10-CM

## 2017-02-02 DIAGNOSIS — Z471 Aftercare following joint replacement surgery: Secondary | ICD-10-CM | POA: Diagnosis not present

## 2017-02-02 HISTORY — PX: TOTAL HIP ARTHROPLASTY: SHX124

## 2017-02-02 LAB — TYPE AND SCREEN
ABO/RH(D): O NEG
Antibody Screen: NEGATIVE

## 2017-02-02 LAB — GLUCOSE, CAPILLARY
Glucose-Capillary: 117 mg/dL — ABNORMAL HIGH (ref 65–99)
Glucose-Capillary: 148 mg/dL — ABNORMAL HIGH (ref 65–99)

## 2017-02-02 SURGERY — ARTHROPLASTY, HIP, TOTAL, ANTERIOR APPROACH
Anesthesia: General | Site: Hip | Laterality: Left

## 2017-02-02 MED ORDER — ASPIRIN 81 MG PO CHEW
81.0000 mg | CHEWABLE_TABLET | Freq: Two times a day (BID) | ORAL | Status: DC
  Administered 2017-02-02 – 2017-02-03 (×2): 81 mg via ORAL
  Filled 2017-02-02 (×2): qty 1

## 2017-02-02 MED ORDER — METFORMIN HCL 500 MG PO TABS
500.0000 mg | ORAL_TABLET | Freq: Two times a day (BID) | ORAL | Status: DC
  Administered 2017-02-02 – 2017-02-03 (×2): 500 mg via ORAL
  Filled 2017-02-02 (×2): qty 1

## 2017-02-02 MED ORDER — METHOCARBAMOL 500 MG PO TABS: 500.0000 mg | ORAL_TABLET | Freq: Four times a day (QID) | ORAL | Status: DC | PRN

## 2017-02-02 MED ORDER — SODIUM CHLORIDE 0.9 % IV SOLN
INTRAVENOUS | Status: DC
  Administered 2017-02-02: 12:00:00 via INTRAVENOUS

## 2017-02-02 MED ORDER — DOCUSATE SODIUM 100 MG PO CAPS
100.0000 mg | ORAL_CAPSULE | Freq: Two times a day (BID) | ORAL | Status: DC
  Administered 2017-02-03: 100 mg via ORAL
  Filled 2017-02-02 (×2): qty 1

## 2017-02-02 MED ORDER — TRANEXAMIC ACID 1000 MG/10ML IV SOLN
1000.0000 mg | INTRAVENOUS | Status: AC
  Administered 2017-02-02: 1000 mg via INTRAVENOUS
  Filled 2017-02-02: qty 1100

## 2017-02-02 MED ORDER — LACTATED RINGERS IV SOLN
INTRAVENOUS | Status: DC | PRN
  Administered 2017-02-02: 07:00:00 via INTRAVENOUS

## 2017-02-02 MED ORDER — AMLODIPINE BESYLATE 5 MG PO TABS
5.0000 mg | ORAL_TABLET | Freq: Every day | ORAL | Status: DC
  Administered 2017-02-02 – 2017-02-03 (×2): 5 mg via ORAL
  Filled 2017-02-02 (×2): qty 1

## 2017-02-02 MED ORDER — SODIUM CHLORIDE 0.9 % IR SOLN
Status: DC | PRN
  Administered 2017-02-02: 1000 mL

## 2017-02-02 MED ORDER — KETOROLAC TROMETHAMINE 15 MG/ML IJ SOLN
7.5000 mg | Freq: Four times a day (QID) | INTRAMUSCULAR | Status: AC
  Administered 2017-02-02 – 2017-02-03 (×4): 7.5 mg via INTRAVENOUS
  Filled 2017-02-02 (×4): qty 1

## 2017-02-02 MED ORDER — ETOMIDATE 2 MG/ML IV SOLN
INTRAVENOUS | Status: AC
  Filled 2017-02-02: qty 10

## 2017-02-02 MED ORDER — SUGAMMADEX SODIUM 500 MG/5ML IV SOLN
INTRAVENOUS | Status: AC
  Filled 2017-02-02: qty 5

## 2017-02-02 MED ORDER — DEXAMETHASONE SODIUM PHOSPHATE 4 MG/ML IJ SOLN
INTRAMUSCULAR | Status: DC | PRN
  Administered 2017-02-02: 4 mg via INTRAVENOUS

## 2017-02-02 MED ORDER — ACETAMINOPHEN 650 MG RE SUPP: 650.0000 mg | Freq: Four times a day (QID) | RECTAL | Status: DC | PRN

## 2017-02-02 MED ORDER — METOCLOPRAMIDE HCL 5 MG/ML IJ SOLN: 5.0000 mg | Freq: Three times a day (TID) | INTRAMUSCULAR | Status: DC | PRN

## 2017-02-02 MED ORDER — MENTHOL 3 MG MT LOZG: 1.0000 | LOZENGE | OROMUCOSAL | Status: DC | PRN

## 2017-02-02 MED ORDER — CEFAZOLIN SODIUM-DEXTROSE 2-4 GM/100ML-% IV SOLN
INTRAVENOUS | Status: AC
  Filled 2017-02-02: qty 100

## 2017-02-02 MED ORDER — VITAMIN B-12 1000 MCG PO TABS
1000.0000 ug | ORAL_TABLET | Freq: Every day | ORAL | Status: DC
  Administered 2017-02-03: 1000 ug via ORAL
  Filled 2017-02-02: qty 1

## 2017-02-02 MED ORDER — FENTANYL CITRATE (PF) 100 MCG/2ML IJ SOLN
INTRAMUSCULAR | Status: AC
Start: 2017-02-02 — End: 2017-02-02
  Filled 2017-02-02: qty 2

## 2017-02-02 MED ORDER — HYDROMORPHONE HCL 2 MG/ML IJ SOLN: 0.5000 mg | INTRAMUSCULAR | Status: DC | PRN

## 2017-02-02 MED ORDER — PHENOL 1.4 % MT LIQD: 1.0000 | OROMUCOSAL | Status: DC | PRN

## 2017-02-02 MED ORDER — ONDANSETRON HCL 4 MG/2ML IJ SOLN
INTRAMUSCULAR | Status: AC
  Filled 2017-02-02: qty 4

## 2017-02-02 MED ORDER — FENTANYL CITRATE (PF) 100 MCG/2ML IJ SOLN
INTRAMUSCULAR | Status: DC | PRN
  Administered 2017-02-02 (×2): 50 ug via INTRAVENOUS
  Administered 2017-02-02: 25 ug via INTRAVENOUS
  Administered 2017-02-02 (×2): 50 ug via INTRAVENOUS
  Administered 2017-02-02: 25 ug via INTRAVENOUS

## 2017-02-02 MED ORDER — FENTANYL CITRATE (PF) 100 MCG/2ML IJ SOLN
25.0000 ug | INTRAMUSCULAR | Status: DC | PRN
  Administered 2017-02-02 (×4): 50 ug via INTRAVENOUS

## 2017-02-02 MED ORDER — CO Q-10 100 MG PO CAPS: ORAL_CAPSULE | Freq: Every day | ORAL | Status: DC

## 2017-02-02 MED ORDER — PROPOFOL 10 MG/ML IV BOLUS
INTRAVENOUS | Status: DC | PRN
  Administered 2017-02-02 (×3): 40 mg via INTRAVENOUS

## 2017-02-02 MED ORDER — LABETALOL HCL 5 MG/ML IV SOLN
INTRAVENOUS | Status: AC
  Filled 2017-02-02: qty 4

## 2017-02-02 MED ORDER — CHLORHEXIDINE GLUCONATE 4 % EX LIQD: 60.0000 mL | Freq: Once | CUTANEOUS | Status: DC

## 2017-02-02 MED ORDER — PROPOFOL 10 MG/ML IV BOLUS
INTRAVENOUS | Status: AC
  Filled 2017-02-02: qty 40

## 2017-02-02 MED ORDER — OXYCODONE HCL 5 MG PO TABS: 5.0000 mg | ORAL_TABLET | ORAL | Status: DC | PRN

## 2017-02-02 MED ORDER — PHENYLEPHRINE HCL 10 MG/ML IJ SOLN
INTRAMUSCULAR | Status: DC | PRN
  Administered 2017-02-02 (×2): 80 ug via INTRAVENOUS

## 2017-02-02 MED ORDER — FENTANYL CITRATE (PF) 100 MCG/2ML IJ SOLN
INTRAMUSCULAR | Status: AC
  Filled 2017-02-02: qty 2

## 2017-02-02 MED ORDER — SUGAMMADEX SODIUM 200 MG/2ML IV SOLN
INTRAVENOUS | Status: DC | PRN
  Administered 2017-02-02: 200 mg via INTRAVENOUS

## 2017-02-02 MED ORDER — FLUTICASONE PROPIONATE 50 MCG/ACT NA SUSP
2.0000 | Freq: Every day | NASAL | Status: DC
  Filled 2017-02-02: qty 16

## 2017-02-02 MED ORDER — CEFAZOLIN SODIUM-DEXTROSE 2-4 GM/100ML-% IV SOLN
2.0000 g | INTRAVENOUS | Status: AC
  Administered 2017-02-02: 2 g via INTRAVENOUS

## 2017-02-02 MED ORDER — PROPOFOL 500 MG/50ML IV EMUL
INTRAVENOUS | Status: DC | PRN
  Administered 2017-02-02: 50 ug/kg/min via INTRAVENOUS

## 2017-02-02 MED ORDER — VITAMIN B-12 1000 MCG SL SUBL: 1000.0000 ug | SUBLINGUAL_TABLET | Freq: Every day | SUBLINGUAL | Status: DC

## 2017-02-02 MED ORDER — ETOMIDATE 2 MG/ML IV SOLN
INTRAVENOUS | Status: DC | PRN
  Administered 2017-02-02: 16 mg via INTRAVENOUS
  Administered 2017-02-02: 4 mg via INTRAVENOUS

## 2017-02-02 MED ORDER — PHENYLEPHRINE HCL 10 MG/ML IJ SOLN
INTRAMUSCULAR | Status: AC
  Filled 2017-02-02: qty 1

## 2017-02-02 MED ORDER — DEXAMETHASONE SODIUM PHOSPHATE 10 MG/ML IJ SOLN
INTRAMUSCULAR | Status: AC
  Filled 2017-02-02: qty 1

## 2017-02-02 MED ORDER — POLYETHYLENE GLYCOL 3350 17 G PO PACK: 17.0000 g | PACK | Freq: Every day | ORAL | Status: DC | PRN

## 2017-02-02 MED ORDER — LIDOCAINE HCL (CARDIAC) 20 MG/ML IV SOLN
INTRAVENOUS | Status: DC | PRN
  Administered 2017-02-02: 100 mg via INTRAVENOUS

## 2017-02-02 MED ORDER — PROPOFOL 10 MG/ML IV BOLUS
INTRAVENOUS | Status: AC
  Filled 2017-02-02: qty 20

## 2017-02-02 MED ORDER — MECLIZINE HCL 25 MG PO TABS
25.0000 mg | ORAL_TABLET | Freq: Three times a day (TID) | ORAL | Status: DC | PRN
  Filled 2017-02-02: qty 1

## 2017-02-02 MED ORDER — LABETALOL HCL 5 MG/ML IV SOLN
INTRAVENOUS | Status: DC | PRN
  Administered 2017-02-02: 5 mg via INTRAVENOUS

## 2017-02-02 MED ORDER — ONDANSETRON HCL 4 MG PO TABS: 4.0000 mg | ORAL_TABLET | Freq: Four times a day (QID) | ORAL | Status: DC | PRN

## 2017-02-02 MED ORDER — DIPHENHYDRAMINE HCL 12.5 MG/5ML PO ELIX: 12.5000 mg | ORAL_SOLUTION | ORAL | Status: DC | PRN

## 2017-02-02 MED ORDER — ACETAMINOPHEN 325 MG PO TABS: 650.0000 mg | ORAL_TABLET | Freq: Four times a day (QID) | ORAL | Status: DC | PRN

## 2017-02-02 MED ORDER — ONDANSETRON HCL 4 MG/2ML IJ SOLN
INTRAMUSCULAR | Status: DC | PRN
  Administered 2017-02-02: 8 mg via INTRAVENOUS

## 2017-02-02 MED ORDER — ALUM & MAG HYDROXIDE-SIMETH 200-200-20 MG/5ML PO SUSP: 30.0000 mL | ORAL | Status: DC | PRN

## 2017-02-02 MED ORDER — METOCLOPRAMIDE HCL 5 MG PO TABS: 5.0000 mg | ORAL_TABLET | Freq: Three times a day (TID) | ORAL | Status: DC | PRN

## 2017-02-02 MED ORDER — METHOCARBAMOL 1000 MG/10ML IJ SOLN
500.0000 mg | Freq: Four times a day (QID) | INTRAMUSCULAR | Status: DC | PRN
  Administered 2017-02-02: 500 mg via INTRAVENOUS
  Filled 2017-02-02: qty 5
  Filled 2017-02-02: qty 550

## 2017-02-02 MED ORDER — CEFAZOLIN IN D5W 1 GM/50ML IV SOLN
1.0000 g | Freq: Four times a day (QID) | INTRAVENOUS | Status: AC
  Administered 2017-02-02 (×2): 1 g via INTRAVENOUS
  Filled 2017-02-02 (×2): qty 50

## 2017-02-02 MED ORDER — ONDANSETRON HCL 4 MG/2ML IJ SOLN: 4.0000 mg | Freq: Four times a day (QID) | INTRAMUSCULAR | Status: DC | PRN

## 2017-02-02 MED ORDER — ROCURONIUM BROMIDE 100 MG/10ML IV SOLN
INTRAVENOUS | Status: DC | PRN
  Administered 2017-02-02: 50 mg via INTRAVENOUS

## 2017-02-02 SURGICAL SUPPLY — 39 items
APL SKNCLS STERI-STRIP NONHPOA (GAUZE/BANDAGES/DRESSINGS) ×1
BAG SPEC THK2 15X12 ZIP CLS (MISCELLANEOUS)
BAG ZIPLOCK 12X15 (MISCELLANEOUS) IMPLANT
BENZOIN TINCTURE PRP APPL 2/3 (GAUZE/BANDAGES/DRESSINGS) ×1 IMPLANT
BLADE SAW SGTL 18X1.27X75 (BLADE) ×2 IMPLANT
CAPT HIP TOTAL 2 ×1 IMPLANT
CELLS DAT CNTRL 66122 CELL SVR (MISCELLANEOUS) ×1 IMPLANT
CLOTH BEACON ORANGE TIMEOUT ST (SAFETY) ×2 IMPLANT
COVER PERINEAL POST (MISCELLANEOUS) ×2 IMPLANT
COVER SURGICAL LIGHT HANDLE (MISCELLANEOUS) ×2 IMPLANT
DRAPE STERI IOBAN 125X83 (DRAPES) ×2 IMPLANT
DRAPE U-SHAPE 47X51 STRL (DRAPES) ×4 IMPLANT
DRSG AQUACEL AG ADV 3.5X10 (GAUZE/BANDAGES/DRESSINGS) ×2 IMPLANT
DURAPREP 26ML APPLICATOR (WOUND CARE) ×2 IMPLANT
ELECT REM PT RETURN 15FT ADLT (MISCELLANEOUS) ×2 IMPLANT
GAUZE XEROFORM 1X8 LF (GAUZE/BANDAGES/DRESSINGS) IMPLANT
GLOVE BIO SURGEON STRL SZ7.5 (GLOVE) ×2 IMPLANT
GLOVE BIOGEL PI IND STRL 7.0 (GLOVE) IMPLANT
GLOVE BIOGEL PI IND STRL 8 (GLOVE) ×2 IMPLANT
GLOVE BIOGEL PI INDICATOR 7.0 (GLOVE) ×4
GLOVE BIOGEL PI INDICATOR 8 (GLOVE) ×2
GLOVE ECLIPSE 8.0 STRL XLNG CF (GLOVE) ×2 IMPLANT
GOWN STRL REUS W/TWL XL LVL3 (GOWN DISPOSABLE) ×6 IMPLANT
HANDPIECE INTERPULSE COAX TIP (DISPOSABLE) ×2
HOLDER FOLEY CATH W/STRAP (MISCELLANEOUS) ×2 IMPLANT
PACK ANTERIOR HIP CUSTOM (KITS) ×2 IMPLANT
RETRACTOR WND ALEXIS 18 MED (MISCELLANEOUS) ×1 IMPLANT
RTRCTR WOUND ALEXIS 18CM MED (MISCELLANEOUS) ×2
SET HNDPC FAN SPRY TIP SCT (DISPOSABLE) ×1 IMPLANT
STAPLER VISISTAT 35W (STAPLE) IMPLANT
STRIP CLOSURE SKIN 1/2X4 (GAUZE/BANDAGES/DRESSINGS) ×1 IMPLANT
SUT ETHIBOND NAB CT1 #1 30IN (SUTURE) ×2 IMPLANT
SUT MNCRL AB 4-0 PS2 18 (SUTURE) IMPLANT
SUT VIC AB 0 CT1 36 (SUTURE) ×2 IMPLANT
SUT VIC AB 1 CT1 36 (SUTURE) ×2 IMPLANT
SUT VIC AB 2-0 CT1 27 (SUTURE) ×4
SUT VIC AB 2-0 CT1 TAPERPNT 27 (SUTURE) ×2 IMPLANT
TRAY FOLEY W/METER SILVER 16FR (SET/KITS/TRAYS/PACK) ×2 IMPLANT
YANKAUER SUCT BULB TIP 10FT TU (MISCELLANEOUS) ×2 IMPLANT

## 2017-02-02 NOTE — H&P (Signed)
TOTAL HIP ADMISSION H&P  Patient is admitted for left total hip arthroplasty.  Subjective:  Chief Complaint: left hip pain  HPI: Jeremy Stone, 83 y.o. male, has a history of pain and functional disability in the left hip(s) due to arthritis and patient has failed non-surgical conservative treatments for greater than 12 weeks to include NSAID's and/or analgesics, corticosteriod injections, viscosupplementation injections, flexibility and strengthening excercises, use of assistive devices and activity modification.  Onset of symptoms was gradual starting 3 years ago with gradually worsening course since that time.The patient noted no past surgery on the left hip(s).  Patient currently rates pain in the left hip at 10 out of 10 with activity. Patient has night pain, worsening of pain with activity and weight bearing, trendelenberg gait, pain that interfers with activities of daily living and pain with passive range of motion. Patient has evidence of subchondral cysts, subchondral sclerosis, periarticular osteophytes and joint space narrowing by imaging studies. This condition presents safety issues increasing the risk of falls.  There is no current active infection.  Patient Active Problem List   Diagnosis Date Noted  . Unilateral primary osteoarthritis, left hip 11/22/2016  . Lumbar radiculopathy 10/04/2016  . Spinal stenosis of lumbar region with neurogenic claudication 10/04/2016  . Radiculopathy due to lumbar intervertebral disc disorder 10/04/2016  . Unspecified constipation 02/04/2014  . Rhinitis, non-allergic 10/23/2013  . PRIMARY HYPERCOAGULABLE STATE 08/18/2009  . CHRONIC PULMONARY EMBOLISM 08/09/2009  . HYPERTENSION 08/04/2009  . DRY MOUTH 06/09/2008  . CARDIAC MURMUR, SYSTOLIC 29/79/8921  . URINARY FREQUENCY 06/09/2008  . DEPRESSIVE DISORDER 04/01/2008  . Obstructive sleep apnea 04/01/2008  . INSOMNIA-SLEEP DISORDER-UNSPEC 04/01/2008  . DM, UNCOMPLICATED, TYPE II 19/41/7408   . TRIGEMINAL NEURALGIA 07/08/2007  . HYPERLIPIDEMIA 07/01/2007  . GERD 07/01/2007   Past Medical History:  Diagnosis Date  . GERD (gastroesophageal reflux disease)   . Hyperlipidemia   . Hypertension   . NIDDM (non-insulin dependent diabetes mellitus)   . OSA (obstructive sleep apnea)    uses CPAP  . Thyroid nodule   . Trigeminal neuralgia     Past Surgical History:  Procedure Laterality Date  . APPENDECTOMY    . TEE WITHOUT CARDIOVERSION N/A 05/01/2013   Procedure: TRANSESOPHAGEAL ECHOCARDIOGRAM (TEE);  Surgeon: Thayer Headings, MD;  Location: [facility];  Service: Cardiovascular;  Laterality: N/A;  Need to make sure [facility] is arranged. Notifed RN at [employer] to do this on 04-30-2013  . TRANSESOPHAGEAL ECHOCARDIOGRAM W/O CARDIOVERSION  12/06/2016  . trigeminal surgery     07-22-07- [facility]    Facility-Administered Medications Prior to Admission  Medication Dose Route Frequency Provider Last Rate Last Dose  . lidocaine (PF) (XYLOCAINE) 1 % injection 0.3 mL  0.3 mL Other Once Magnus Sinning, MD       Prescriptions Prior to Admission  Medication Sig Dispense Refill Last Dose  . amLODipine (NORVASC) 5 MG tablet Take 1 tablet (5 mg total) by mouth daily. 90 tablet 3 01/31/2017  . aspirin EC 81 MG tablet Take 81 mg by mouth at bedtime.   Past Month at Unknown time  . Coenzyme Q10 (CO Q-10) 100 MG CAPS Take by mouth daily.   01/31/2017  . Cyanocobalamin (VITAMIN B-12) 1000 MCG SUBL Place 1,000 mcg under the tongue daily.   01/31/2017  . fluticasone (FLONASE) 50 MCG/ACT nasal spray Place 2 sprays into both nostrils daily. (Patient taking differently: Place 2 sprays into both nostrils at bedtime. ) 16 g 6 02/01/2017 at hs  . meclizine (ANTIVERT)  25 MG tablet Take 25 mg by mouth 3 (three) times daily as needed for dizziness.   Past Month at Unknown time  . metFORMIN (GLUCOPHAGE) 500 MG tablet Take 1 tablet (500 mg total) by mouth 2 (two) times daily with a meal. 180 tablet 3 01/31/2017  . naproxen  sodium (ALEVE) 220 MG tablet Take 220 mg by mouth 2 (two) times daily as needed (for pain).   Past Month at Unknown time  . traMADol (ULTRAM) 50 MG tablet Take 1 tablet (50 mg total) by mouth every 6 (six) hours as needed for moderate pain. 30 tablet 0 More than a month at Unknown time   Allergies  Allergen Reactions  . Lactose Intolerance (Gi)   . Sulfa Antibiotics Other (See Comments)    dizziness    Social History  Substance Use Topics  . Smoking status: Former Smoker    Packs/day: 1.00    Years: 20.00    Types: Cigarettes    Quit date: 10/23/1982  . Smokeless tobacco: Never Used  . Alcohol use 5.0 oz/week    10 Standard drinks or equivalent per week     Comment: wine with dinner    Family History  Problem Relation Age of Onset  . Dementia Sister      Review of Systems  Musculoskeletal: Positive for joint pain.  All other systems reviewed and are negative.   Objective:  Physical Exam  Constitutional: He is oriented to person, place, and time. He appears well-developed and well-nourished.  HENT:  Head: Normocephalic and atraumatic.  Eyes: EOM are normal. Pupils are equal, round, and reactive to light.  Neck: Normal range of motion. Neck supple.  Cardiovascular: Normal rate and regular rhythm.   Respiratory: Effort normal and breath sounds normal.  GI: Soft. Bowel sounds are normal.  Musculoskeletal:       Left hip: He exhibits decreased range of motion, decreased strength, tenderness and bony tenderness.  Neurological: He is alert and oriented to person, place, and time.  Skin: Skin is warm and dry.  Psychiatric: He has a normal mood and affect.    Vital signs in last 24 hours: Temp:  [97.7 F (36.5 C)] 97.7 F (36.5 C) (04/13 0557) Pulse Rate:  [92] 92 (04/13 0557) Resp:  [18] 18 (04/13 0557) BP: (164)/(81) 164/81 (04/13 0557) SpO2:  [97 %] 97 % (04/13 0557) Weight:  [186 lb 8 oz (84.6 kg)] 186 lb 8 oz (84.6 kg) (04/13 0605)  Labs:   Estimated body mass  index is 28.36 kg/m as calculated from the following:   Height as of this encounter: 5\' 8"  (1.727 m).   Weight as of this encounter: 186 lb 8 oz (84.6 kg).   Imaging Review Plain radiographs demonstrate severe degenerative joint disease of the left hip(s). The bone quality appears to be good for age and reported activity level.  Assessment/Plan:  End stage arthritis, left hip(s)  The patient history, physical examination, clinical judgement of the provider and imaging studies are consistent with end stage degenerative joint disease of the left hip(s) and total hip arthroplasty is deemed medically necessary. The treatment options including medical management, injection therapy, arthroscopy and arthroplasty were discussed at length. The risks and benefits of total hip arthroplasty were presented and reviewed. The risks due to aseptic loosening, infection, stiffness, dislocation/subluxation,  thromboembolic complications and other imponderables were discussed.  The patient acknowledged the explanation, agreed to proceed with the plan and consent was signed. Patient is being admitted for inpatient  treatment for surgery, pain control, PT, OT, prophylactic antibiotics, VTE prophylaxis, progressive ambulation and ADL's and discharge planning.The patient is planning to be discharged home with home health services

## 2017-02-02 NOTE — Anesthesia Preprocedure Evaluation (Signed)
Anesthesia Evaluation  Patient identified by MRN, date of birth, ID band Patient awake    Reviewed: Allergy & Precautions, NPO status , Patient's Chart, lab work & pertinent test results  History of Anesthesia Complications (+) Emergence Delirium  Airway Mallampati: II  TM Distance: >3 FB     Dental   Pulmonary sleep apnea , former smoker,    breath sounds clear to auscultation       Cardiovascular hypertension, + Valvular Problems/Murmurs AS  Rhythm:Regular Rate:Normal + Systolic murmurs    Neuro/Psych  Neuromuscular disease    GI/Hepatic Neg liver ROS, GERD  ,  Endo/Other  diabetes  Renal/GU negative Renal ROS     Musculoskeletal  (+) Arthritis ,   Abdominal   Peds  Hematology   Anesthesia Other Findings   Reproductive/Obstetrics                             Anesthesia Physical Anesthesia Plan  ASA: III  Anesthesia Plan: General   Post-op Pain Management:    Induction: Intravenous  Airway Management Planned: Oral ETT  Additional Equipment:   Intra-op Plan:   Post-operative Plan: Possible Post-op intubation/ventilation  Informed Consent: I have reviewed the patients History and Physical, chart, labs and discussed the procedure including the risks, benefits and alternatives for the proposed anesthesia with the patient or authorized representative who has indicated his/her understanding and acceptance.     Plan Discussed with: CRNA and Anesthesiologist  Anesthesia Plan Comments:         Anesthesia Quick Evaluation

## 2017-02-02 NOTE — Progress Notes (Signed)
CSW received consult for placement. Discharge Plan on H&P indicates patient will discharge home with home health.  Please reconsult if CSW if needs arise.

## 2017-02-02 NOTE — Transfer of Care (Signed)
Immediate Anesthesia Transfer of Care Note  Patient: Jeremy Stone  Procedure(s) Performed: Procedure(s): LEFT TOTAL HIP ARTHROPLASTY ANTERIOR APPROACH (Left)  Patient Location: PACU  Anesthesia Type:General  Level of Consciousness: sedated and drowsy  Airway & Oxygen Therapy: Patient connected to face mask oxygen  Post-op Assessment: Post -op Vital signs reviewed and stable  Post vital signs: stable  Last Vitals:  Vitals:   02/02/17 0557  BP: (!) 164/81  Pulse: 92  Resp: 18  Temp: 36.5 C    Last Pain:  Vitals:   02/02/17 0557  TempSrc: Oral         Complications: No apparent anesthesia complications

## 2017-02-02 NOTE — Op Note (Signed)
NAME:  [name], SIDES NO.:  MEDICAL RECORD NO.:  [id number]  LOCATION:                                 FACILITY:  PHYSICIAN:  Jeremy Stone, M.D.DATE OF BIRTH:  DATE OF PROCEDURE:  02/02/2017 DATE OF DISCHARGE:                              OPERATIVE REPORT   PREOPERATIVE DIAGNOSIS:  Primary osteoarthritis and degenerative joint disease, left hip.  POSTOPERATIVE DIAGNOSIS:  Primary osteoarthritis and degenerative joint disease, left hip.  PROCEDURE:  Left total hip arthroplasty through direct anterior approach.  IMPLANTS:  DePuy Sector Gription acetabular component size 56, size 36+ 0 polyethylene liner, size 12 Corail femoral component with standard offset, size 36+ 1.5 ceramic hip ball.  SURGEON:  Jeremy Rossetti, MD.  ASSISTANT:  Jeremy Emery, PA-C.  ANESTHESIA:  General.  ANTIBIOTICS:  2 g of IV Ancef.  BLOOD LOSS:  200-300 mL.  COMPLICATIONS:  None.  INDICATIONS:  Jeremy Stone is a very spry and pleasant 83 year old gentleman with debilitating arthritis involving his left hip.  He has tried and failed all forms of conservative treatment and his x-rays do confirm severe end-stage arthritis of his hip.  He does wish to proceed with a total hip replacement at this point.  His hip arthritis is detrimentally affected his activities of daily living, his quality of life, and mobility, and his pain is 10/10.  He understands the goals of surgery decreased pain, improved mobility, and overall improved quality of life.  He understands the risk of acute blood loss anemia, nerve and vessel injury, fracture, infection, dislocation, and DVT.  PROCEDURE DESCRIPTION:  After informed consent was obtained, appropriate left hip was marked.  He was brought to the operating room.  General anesthesia was obtained while he was on the stretcher.  Foley catheter was placed.  Then, both feet had traction boots applied to them.  Next, he was  placed supine on the Hana fracture table, the perineal post in place and both legs in inline skeletal traction devices, but no traction applied.  His left operative hip was then prepped and draped with DuraPrep and sterile drapes.  A time-out was called.  He was identified as correct patient and correct left hip.  I then made an incision just inferior and posterior to the anterior superior iliac spine and carried this obliquely down the leg.  I dissected down the tensor fascia lata muscle.  The tensor fascia was then divided longitudinally to proceed with a direct anterior approach to the hip.  We identified and cauterized the circumflex vessels, then identified the hip capsule, opened the hip capsule in an L-type format finding a moderate joint effusion and significant arthritis throughout his left hip.  We placed Cobra retractor on the medial and lateral femoral neck and then made our femoral neck cut with an oscillating saw proximal to the lesser trochanter and completed this on osteotome.  I placed a corkscrew guide in the femoral head and removed the femoral head in its entirety and found it to be completely devoid of cartilage.  We then placed a bent Hohmann over the medial acetabular rim and cleaned the remnants of acetabular labrum from the acetabulum.  We then began reaming under direct visualization from a size 43 reamer up to a size 56 with all reamers under direct visualization and the last reamer under direct fluoroscopy, so we could obtain our depth of reaming, our inclination, and anteversion.  Once we were pleased with this, we placed the real DePuy Sector Gription acetabular component size 56 and a 36+ 0 neutral polyethylene liner for that size 56 acetabular component.  Attention was then turned to the femur.  With the leg externally rotated to 120 degrees, extended and adducted, we were able to place a Mueller retractor medially and a Hohmann retractor behind the  greater trochanter.  We released the lateral joint capsule and used a box cutting osteotome the inner femoral canal and a rongeur to lateralize. We then began broaching from a size 8 broach using the Corail broaching system going up to a size 12.  With a size 12 in place, we trialed a standard offset femoral neck and a 36+ 1.5 hip ball.  We reduced this in acetabulum and he was stable, we felt like we needed just a little bit more leg length and offset.  We dislocated the hip and removed the trial components.  We were able to place the real Corail femoral component with standard offset, size 12 and then the real 36+ 5 ceramic hip ball reduced this in acetabulum and again, it was stable.  We then irrigated the soft tissue normal saline solution using pulsatile lavage.  We were able to close the joint capsule with interrupted #1 Ethibond suture, followed by running #1 Vicryl in the tensor fascia, 0 Vicryl in the deep tissue, 2-0 Vicryl in the subcutaneous tissue, 4-0 Monocryl subcuticular stitch, and Steri-Strips on the skin.  Well-padded Aquacel dressing was applied.  He was taken off the Hana table, awakened, extubated, and taken to recovery room in stable condition.  All final counts were correct.  There were no complications noted.  Of note, Jeremy Emery, PA- C assisted in the entire case.  His assistance was crucial for facilitating all aspects of this case.     Jeremy Stone, M.D.     CYB/MEDQ  D:  02/02/2017  T:  02/02/2017  Job:  [id number]

## 2017-02-02 NOTE — Brief Op Note (Signed)
02/02/2017  8:51 AM  PATIENT:  Alexandria Lodge  83 y.o. male  PRE-OPERATIVE DIAGNOSIS:  osteoarthritis left hip  POST-OPERATIVE DIAGNOSIS:  osteoarthritis left hip  PROCEDURE:  Procedure(s): LEFT TOTAL HIP ARTHROPLASTY ANTERIOR APPROACH (Left)  SURGEON:  Surgeon(s) and Role:    * Mcarthur Rossetti, MD - Primary  PHYSICIAN ASSISTANT: Benita Stabile, PA-C  ANESTHESIA:   general  EBL:  Total I/O In: -  Out: 300 [Urine:200; Blood:100]  COUNTS:  YES  DICTATION: .Other Dictation: Dictation Number [id number]  PLAN OF CARE: Admit to inpatient   PATIENT DISPOSITION:  PACU - hemodynamically stable.   Delay start of Pharmacological VTE agent (>24hrs) due to surgical blood loss or risk of bleeding: no

## 2017-02-02 NOTE — Anesthesia Postprocedure Evaluation (Signed)
Anesthesia Post Note  Patient: Jeremy Stone  Procedure(s) Performed: Procedure(s) (LRB): LEFT TOTAL HIP ARTHROPLASTY ANTERIOR APPROACH (Left)  Patient location during evaluation: PACU Anesthesia Type: General Level of consciousness: awake Pain management: pain level controlled Vital Signs Assessment: post-procedure vital signs reviewed and stable Respiratory status: spontaneous breathing Cardiovascular status: stable Anesthetic complications: no       Last Vitals:  Vitals:   02/02/17 0557 02/02/17 0911  BP: (!) 164/81 (!) 165/105  Pulse: 92 71  Resp: 18 18  Temp: 36.5 C 37.2 C    Last Pain:  Vitals:   02/02/17 0557  TempSrc: Oral                 Takao Lizer

## 2017-02-02 NOTE — Anesthesia Procedure Notes (Signed)
Procedure Name: Intubation Date/Time: 02/02/2017 7:34 AM Performed by: Claudia Desanctis Pre-anesthesia Checklist: Patient identified, Emergency Drugs available, Suction available and Patient being monitored Patient Re-evaluated:Patient Re-evaluated prior to inductionOxygen Delivery Method: Circle system utilized Preoxygenation: Pre-oxygenation with 100% oxygen Intubation Type: IV induction Ventilation: Mask ventilation without difficulty Laryngoscope Size: 2 and Miller Grade View: Grade I Tube type: Oral Tube size: 7.5 mm Number of attempts: 1 Airway Equipment and Method: Stylet Placement Confirmation: ETT inserted through vocal cords under direct vision,  positive ETCO2 and breath sounds checked- equal and bilateral Secured at: 22 cm Tube secured with: Tape Dental Injury: Teeth and Oropharynx as per pre-operative assessment

## 2017-02-02 NOTE — Evaluation (Signed)
Physical Therapy Evaluation Patient Details Name: Jeremy Stone MRN: [id number] DOB: [birth year] Today's Date: 02/02/2017   History of Present Illness  Pt s/p L THR  Clinical Impression  Pt s/p L THR and presents with decreased L LE strength/ROM and post op pain limiting functional mobility.  Pt should progress to dc home with family assist and HHPT follow up.    Follow Up Recommendations Home health PT    Equipment Recommendations  None recommended by PT    Recommendations for Other Services OT consult     Precautions / Restrictions Precautions Precautions: Fall Restrictions Weight Bearing Restrictions: No Other Position/Activity Restrictions: WBAT      Mobility  Bed Mobility               General bed mobility comments: NT - Pt sitting on EOB with nursing  Transfers Overall transfer level: Needs assistance Equipment used: Rolling walker (2 wheeled) Transfers: Sit to/from Stand Sit to Stand: Min assist         General transfer comment: cues for LE management and use of UEs to self assist  Ambulation/Gait Ambulation/Gait assistance: Min assist Ambulation Distance (Feet): 90 Feet Assistive device: Rolling walker (2 wheeled) Gait Pattern/deviations: Step-to pattern Gait velocity: DECR Gait velocity interpretation: Below normal speed for age/gender General Gait Details: cues for sequence, posture and position from RW  Stairs            Wheelchair Mobility    Modified Rankin (Stroke Patients Only)       Balance                                             Pertinent Vitals/Pain Pain Assessment: 0-10 Pain Score: 7  (With mobility - no pain at rest) Pain Location: L hip Pain Descriptors / Indicators: Aching;Sore Pain Intervention(s): Limited activity within patient's tolerance;Monitored during session;Premedicated before session;Ice applied    Home Living Family/patient expects to be discharged to:: Private  residence Living Arrangements: Spouse/significant other Available Help at Discharge: Family Type of Home: House Home Access: Stairs to enter Entrance Stairs-Rails: Right Entrance Stairs-Number of Steps: 3 Home Layout: Able to live on main level with bedroom/bathroom Home Equipment: Walker - 2 wheels;Cane - single point      Prior Function Level of Independence: Independent;Independent with assistive device(s)         Comments: used cane as needed     Hand Dominance        Extremity/Trunk Assessment   Upper Extremity Assessment Upper Extremity Assessment: Overall WFL for tasks assessed    Lower Extremity Assessment Lower Extremity Assessment: LLE deficits/detail    Cervical / Trunk Assessment Cervical / Trunk Assessment: Kyphotic  Communication   Communication: No difficulties  Cognition Arousal/Alertness: Awake/alert Behavior During Therapy: WFL for tasks assessed/performed Overall Cognitive Status: Within Functional Limits for tasks assessed                                        General Comments      Exercises Total Joint Exercises Ankle Circles/Pumps: AROM;Both;15 reps;Supine   Assessment/Plan    PT Assessment Patient needs continued PT services  PT Problem List Decreased strength;Decreased range of motion;Decreased activity tolerance;Decreased mobility;Decreased knowledge of use of DME;Pain       PT Treatment Interventions  DME instruction;Gait training;Stair training;Functional mobility training;Therapeutic activities;Therapeutic exercise;Patient/family education    PT Goals (Current goals can be found in the Care Plan section)  Acute Rehab PT Goals Patient Stated Goal: HOME ASAP PT Goal Formulation: With patient Time For Goal Achievement: 02/07/17 Potential to Achieve Goals: Good    Frequency 7X/week   Barriers to discharge        Co-evaluation               End of Session Equipment Utilized During Treatment: Gait  belt Activity Tolerance: Patient tolerated treatment well Patient left: in chair;with call bell/phone within reach;with chair alarm set;with family/visitor present Nurse Communication: Mobility status PT Visit Diagnosis: Unsteadiness on feet (R26.81);Difficulty in walking, not elsewhere classified (R26.2)    Time: 3300-7622 PT Time Calculation (min) (ACUTE ONLY): 24 min   Charges:   PT Evaluation $PT Eval Low Complexity: 1 Procedure PT Treatments $Gait Training: 8-22 mins   PT G Codes:        Pg [phone]   Spike Desilets 02/02/2017, 6:08 PM

## 2017-02-03 LAB — CBC
HEMATOCRIT: 33.4 % — AB (ref 39.0–52.0)
Hemoglobin: 11 g/dL — ABNORMAL LOW (ref 13.0–17.0)
MCH: 30.6 pg (ref 26.0–34.0)
MCHC: 32.9 g/dL (ref 30.0–36.0)
MCV: 93 fL (ref 78.0–100.0)
Platelets: 223 10*3/uL (ref 150–400)
RBC: 3.59 MIL/uL — ABNORMAL LOW (ref 4.22–5.81)
RDW: 13 % (ref 11.5–15.5)
WBC: 10.8 10*3/uL — AB (ref 4.0–10.5)

## 2017-02-03 LAB — BASIC METABOLIC PANEL
ANION GAP: 5 (ref 5–15)
BUN: 23 mg/dL — ABNORMAL HIGH (ref 6–20)
CO2: 25 mmol/L (ref 22–32)
Calcium: 8.6 mg/dL — ABNORMAL LOW (ref 8.9–10.3)
Chloride: 109 mmol/L (ref 101–111)
Creatinine, Ser: 1.21 mg/dL (ref 0.61–1.24)
GFR calc Af Amer: 59 mL/min — ABNORMAL LOW (ref >60)
GFR calc non Af Amer: 51 mL/min — ABNORMAL LOW (ref >60)
GLUCOSE: 135 mg/dL — AB (ref 65–99)
POTASSIUM: 3.9 mmol/L (ref 3.5–5.1)
Sodium: 139 mmol/L (ref 135–145)

## 2017-02-03 LAB — GLUCOSE, CAPILLARY: Glucose-Capillary: 139 mg/dL — ABNORMAL HIGH (ref 65–99)

## 2017-02-03 MED ORDER — ASPIRIN 81 MG PO CHEW: 81.0000 mg | CHEWABLE_TABLET | Freq: Two times a day (BID) | ORAL | 0 refills | Status: DC

## 2017-02-03 NOTE — Evaluation (Signed)
Occupational Therapy Evaluation Patient Details Name: Jeremy Stone MRN: [id number] DOB: October 23, 1926 Today's Date: 02/03/2017    History of Present Illness Pt s/p L THR   Clinical Impression   Pt motivated to participate in therapy. Wife present for session. Pt does require frequent verbal cues to slow down for safety and to stay closer to walker. Wife plans to assist with LB dressing. Educated on safety with functional transfers and ADL. Pt supposed to d/c home today.     Follow Up Recommendations  No OT follow up;Supervision/Assistance - 24 hour    Equipment Recommendations  None recommended by OT    Recommendations for Other Services       Precautions / Restrictions Precautions Precautions: Fall Restrictions Weight Bearing Restrictions: No Other Position/Activity Restrictions: WBAT      Mobility Bed Mobility Overal bed mobility: Needs Assistance Bed Mobility: Supine to Sit     Supine to sit: Min assist    General bed mobility comments: min assist to bring trunk to upright. cues for how to self assist.   Transfers Overall transfer level: Needs assistance Equipment used: Rolling walker (2 wheeled) Transfers: Sit to/from Stand Sit to Stand: Min assist         General transfer comment: cues for hand placement and LE management.    Balance                                           ADL either performed or assessed with clinical judgement   ADL Overall ADL's : Needs assistance/impaired Eating/Feeding: Independent;Sitting   Grooming: Wash/dry hands;Set up;Sitting   Upper Body Bathing: Set up;Sitting   Lower Body Bathing: Moderate assistance;Sit to/from stand   Upper Body Dressing : Set up;Sitting   Lower Body Dressing: Moderate assistance;Sit to/from stand   Toilet Transfer: Minimal assistance;Ambulation;RW;BSC   Toileting- Clothing Manipulation and Hygiene: Minimal assistance;Sit to/from stand   Tub/ Shower Transfer: Walk-in  shower;Minimal assistance;Rolling walker     General ADL Comments: educated on AE options but pt states he has tried to use AE in the past and he didnt use it very well. He states his wife will assist with LB self care. He has a toilet riser with handles at home and discussed that if this is too high for pt, may need to consider a 3in1 once home. Wife states she will check riser height with pt once home. Explained that is important that pt's feet touch the floor when he sits on riser. Practiced stepping backwards over shower ledge with walker and pt was min assist. He doesnt have enough room for walker to come into shower but has a seat very close to entrance of shower and a grab bar. Advised to practice shower transfer with [facility] to ensure safety. Pt needs min cues for staying closer to walker and to slow down for safety. Educated on sitting down for safety for LB dressing and advised wife to be present for ADL initially especially showering. She verbalized understanding.     Vision Patient Visual Report: No change from baseline       Perception     Praxis      Pertinent Vitals/Pain Pain Assessment: 0-10 Pain Score: 5  Pain Location: L hip Pain Descriptors / Indicators: Aching;Sore Pain Intervention(s): Limited activity within patient's tolerance;Monitored during session;Premedicated before session;Ice applied     Hand Dominance  Extremity/Trunk Assessment Upper Extremity Assessment Upper Extremity Assessment: Overall WFL for tasks assessed           Communication     Cognition Arousal/Alertness: Awake/alert Behavior During Therapy: WFL for tasks assessed/performed Overall Cognitive Status: Within Functional Limits for tasks assessed                                     General Comments       Exercises    Shoulder Instructions      Home Living                                          Prior Functioning/Environment                    OT Problem List: Decreased strength;Decreased knowledge of use of DME or AE      OT Treatment/Interventions: Self-care/ADL training;Patient/family education;DME and/or AE instruction;Therapeutic activities    OT Goals(Current goals can be found in the care plan section) Acute Rehab OT Goals Patient Stated Goal: home OT Goal Formulation: With patient/family Time For Goal Achievement: 02/10/17 Potential to Achieve Goals: Good  OT Frequency: Min 2X/week   Barriers to D/C:            Co-evaluation              End of Session Equipment Utilized During Treatment: Rolling walker  Activity Tolerance: Patient tolerated treatment well Patient left: in chair;with call bell/phone within reach;with family/visitor present  OT Visit Diagnosis: Unsteadiness on feet (R26.81);Muscle weakness (generalized) (M62.81)                Time: 9163-8466 OT Time Calculation (min): 37 min Charges:  OT General Charges $OT Visit: 1 Procedure OT Evaluation $OT Eval Low Complexity: 1 Procedure OT Treatments $Therapeutic Activity: 8-22 mins G-Codes:     [phone]  Jae Dire Rhoda Waldvogel 02/03/2017, 12:38 PM

## 2017-02-03 NOTE — Progress Notes (Signed)
Subjective: Doing well states he has no pain in left hip. No complaints wants to go home today after PT.   Objective: Vital signs in last 24 hours: Temp:  [97.5 F (36.4 C)-99.7 F (37.6 C)] 97.7 F (36.5 C) (04/14 0544) Pulse Rate:  [71-97] 86 (04/14 0544) Resp:  [12-20] 16 (04/14 0544) BP: (130-168)/(63-97) 133/80 (04/14 0544) SpO2:  [94 %-100 %] 98 % (04/14 0544) Arterial Line BP: (168-173)/(78-81) 168/81 (04/13 1015)  Intake/Output from previous day: 04/13 0701 - 04/14 0700 In: 2435 [P.O.:1155; I.V.:1175; IV Piggyback:105] Out: 825 [Urine:725; Blood:100] Intake/Output this shift: Total I/O In: 2.3 [I.V.:2.3] Out: 275 [Urine:275]   Recent Labs  02/03/17 0509  HGB 11.0*    Recent Labs  02/03/17 0509  WBC 10.8*  RBC 3.59*  HCT 33.4*  PLT 223    Recent Labs  02/03/17 0509  NA 139  K 3.9  CL 109  CO2 25  BUN 23*  CREATININE 1.21  GLUCOSE 135*  CALCIUM 8.6*   No results for input(s): LABPT, INR in the last 72 hours.  Sensation intact distally Intact pulses distally Dorsiflexion/Plantar flexion intact Incision: dressing C/D/I Compartment soft  Assessment/Plan: Post-OP day 1 s/p left total hip arthroplasty: Up with PT Discharge to home later today if appropriate for discharge.  ABLA secondary to surgery asymtomatic  Jeremy Stone 02/03/2017, 9:16 AM

## 2017-02-03 NOTE — Care Management Note (Signed)
Case Management Note  Patient Details  Name: Jeremy Stone MRN: [id number] Date of Birth: September 04, 1926  Subjective/Objective:    s/p Left TKA                Action/Plan: Discharge Planning: NCM spoke to pt and wife at bedside. Pt states he has RW and 3n1 bedside commode at home. Offered choice/list provided for [facility]. Pt agreeable to [facility].   PCP Lavone Orn MD  Expected Discharge Date:  02/03/17               Expected Discharge Plan:  [place]  In-House Referral:  NA  Discharge planning Services  CM Consult  Post Acute Care Choice:  Home Health Choice offered to:  Spouse  DME Arranged:  N/A DME Agency:  NA  HH Arranged:  PT Central Agency:  [facility] (formerly [facility])  Status of Service:  Completed, signed off  If discussed at [employer] of Stay Meetings, dates discussed:    Additional Comments:  Erenest Rasher, RN 02/03/2017, 12:18 PM

## 2017-02-03 NOTE — Progress Notes (Signed)
Physical Therapy Treatment Patient Details Name: Jeremy Stone MRN: [id number] DOB: Jun 29, 1926 Today's Date: 02/03/2017    History of Present Illness Pt s/p L THR    PT Comments    Pt progressing steadily with mobility but continues impulsive and requiring frequent cues to slow and stay closer to [facility] for safety.  Spouse present and correcting pt.  Reviewed stairs and car transfers with pt and spouse.   Follow Up Recommendations  Home health PT     Equipment Recommendations  None recommended by PT    Recommendations for Other Services OT consult     Precautions / Restrictions Precautions Precautions: Fall Restrictions Weight Bearing Restrictions: No Other Position/Activity Restrictions: WBAT    Mobility  Bed Mobility Overal bed mobility: Needs Assistance Bed Mobility: Supine to Sit     Supine to sit: Min assist Sit to supine: Min assist   General bed mobility comments: min assist to bring trunk to upright. cues for how to self assist.   Transfers Overall transfer level: Needs assistance Equipment used: Rolling walker (2 wheeled) Transfers: Sit to/from Stand Sit to Stand: Min guard;Supervision         General transfer comment: cues for hand placement and LE management.  Repeated x 4  Ambulation/Gait Ambulation/Gait assistance: Min guard;Supervision Ambulation Distance (Feet): 150 Feet Assistive device: Rolling walker (2 wheeled) Gait Pattern/deviations: Step-through pattern;Decreased step length - right;Decreased step length - left;Shuffle;Trunk flexed Gait velocity: DECR Gait velocity interpretation: Below normal speed for age/gender General Gait Details: cues for sequence, posture, stride length and position from RW   Stairs Stairs: Yes   Stair Management: One rail Left;Step to pattern;Forwards;With cane Number of Stairs: 5 General stair comments: cues for sequence and foot/cane placement.  Spouse assisting and written instructions  provided  Wheelchair Mobility    Modified Rankin (Stroke Patients Only)       Balance                                            Cognition Arousal/Alertness: Awake/alert Behavior During Therapy: WFL for tasks assessed/performed Overall Cognitive Status: Within Functional Limits for tasks assessed                                        Exercises Total Joint Exercises Ankle Circles/Pumps: AROM;Both;15 reps;Supine Quad Sets: AROM;Both;10 reps;Supine Heel Slides: AAROM;Left;20 reps;Supine Hip ABduction/ADduction: AAROM;Left;15 reps;Supine    General Comments        Pertinent Vitals/Pain Pain Assessment: 0-10 Pain Score: 5  Pain Location: L hip Pain Descriptors / Indicators: Aching;Sore Pain Intervention(s): Limited activity within patient's tolerance;Monitored during session;Premedicated before session;Ice applied    Home Living                      Prior Function            PT Goals (current goals can now be found in the care plan section) Acute Rehab PT Goals Patient Stated Goal: home PT Goal Formulation: With patient Time For Goal Achievement: 02/07/17 Potential to Achieve Goals: Good Progress towards PT goals: Progressing toward goals    Frequency    7X/week      PT Plan Current plan remains appropriate    Co-evaluation  End of Session Equipment Utilized During Treatment: Gait belt Activity Tolerance: Patient tolerated treatment well Patient left: Other (comment) (sitting EOB with spouse) Nurse Communication: Mobility status PT Visit Diagnosis: Unsteadiness on feet (R26.81);Difficulty in walking, not elsewhere classified (R26.2)     Time: 9357-0177 PT Time Calculation (min) (ACUTE ONLY): 35 min  Charges:  $Gait Training: 8-22 mins $Therapeutic Exercise: 8-22 mins $Therapeutic Activity: 8-22 mins                    G Codes:       Pg [phone]  0923    Saifullah Jolley 02/03/2017, 3:56 PM

## 2017-02-03 NOTE — Discharge Summary (Signed)
Patient ID: Jeremy Stone MRN: [id number] DOB/AGE: [birth year] 83 y.o.  Admit date: 02/02/2017 Discharge date: 02/03/2017  Admission Diagnoses:  Principal Problem:   Unilateral primary osteoarthritis, left hip Active Problems:   Status post total replacement of left hip   Discharge Diagnoses:  Same  Past Medical History:  Diagnosis Date  . GERD (gastroesophageal reflux disease)   . Hyperlipidemia   . Hypertension   . NIDDM (non-insulin dependent diabetes mellitus)   . OSA (obstructive sleep apnea)    uses CPAP  . Thyroid nodule   . Trigeminal neuralgia     Surgeries: Procedure(s): LEFT TOTAL HIP ARTHROPLASTY ANTERIOR APPROACH on 02/02/2017   Consultants:   Discharged Condition: Improved  Hospital Course: Jeremy Stone is an 83 y.o. male who was admitted 02/02/2017 for operative treatment ofUnilateral primary osteoarthritis, left hip. Patient has severe unremitting pain that affects sleep, daily activities, and work/hobbies. After pre-op clearance the patient was taken to the operating room on 02/02/2017 and underwent  Procedure(s): LEFT TOTAL HIP ARTHROPLASTY ANTERIOR APPROACH.    Patient was given perioperative antibiotics: Anti-infectives    Start     Dose/Rate Route Frequency Ordered Stop   02/02/17 1400  ceFAZolin (ANCEF) IVPB 1 g/50 mL premix     1 g 100 mL/hr over 30 Minutes Intravenous Every 6 hours 02/02/17 1106 02/02/17 2120   02/02/17 0640  ceFAZolin (ANCEF) 2-4 GM/100ML-% IVPB    Comments:  [place], Ron   : cabinet override      02/02/17 0640 02/02/17 1844   02/02/17 0543  ceFAZolin (ANCEF) IVPB 2g/100 mL premix     2 g 200 mL/hr over 30 Minutes Intravenous On call to O.R. 02/02/17 0543 02/02/17 0743       Patient was given sequential compression devices, early ambulation, and chemoprophylaxis to prevent DVT.  Patient benefited maximally from hospital stay and there were no complications.    Recent vital signs: Patient Vitals for the past 24  hrs:  BP Temp Temp src Pulse Resp SpO2  02/03/17 0544 133/80 97.7 F (36.5 C) Oral 86 16 98 %  02/03/17 0204 130/88 97.6 F (36.4 C) Axillary 81 18 97 %  02/02/17 2238 - - - 97 20 97 %  02/02/17 2136 (!) 143/95 99.7 F (37.6 C) Oral 88 18 96 %  02/02/17 1825 139/84 98.7 F (37.1 C) Oral 95 16 100 %  02/02/17 1405 (!) 160/80 97.6 F (36.4 C) Oral 86 16 100 %  02/02/17 1305 137/75 97.5 F (36.4 C) Oral 82 17 99 %  02/02/17 1206 138/63 97.7 F (36.5 C) Oral 74 17 97 %  02/02/17 1105 138/66 97.9 F (36.6 C) - 75 18 96 %  02/02/17 1045 138/65 97.9 F (36.6 C) - 74 17 94 %  02/02/17 1037 - - - 80 17 94 %  02/02/17 1030 (!) 140/97 97.9 F (36.6 C) - 80 16 94 %  02/02/17 1015 - - - 76 16 95 %  02/02/17 1000 (!) 155/80 - - 71 12 100 %  02/02/17 0945 (!) 168/95 - - 77 15 99 %  02/02/17 0930 (!) 164/73 - - 74 13 98 %     Recent laboratory studies:  Recent Labs  02/03/17 0509  WBC 10.8*  HGB 11.0*  HCT 33.4*  PLT 223  NA 139  K 3.9  CL 109  CO2 25  BUN 23*  CREATININE 1.21  GLUCOSE 135*  CALCIUM 8.6*     Discharge Medications:   Allergies  as of 02/03/2017      Reactions   Lactose Intolerance (gi)    Sulfa Antibiotics Other (See Comments)   dizziness      Medication List    STOP taking these medications   ALEVE 220 MG tablet Generic drug:  naproxen sodium   aspirin EC 81 MG tablet Replaced by:  aspirin 81 MG chewable tablet     TAKE these medications   amLODipine 5 MG tablet Commonly known as:  NORVASC Take 1 tablet (5 mg total) by mouth daily.   aspirin 81 MG chewable tablet Chew 1 tablet (81 mg total) by mouth 2 (two) times daily. Replaces:  aspirin EC 81 MG tablet   Co Q-10 100 MG Caps Take by mouth daily.   fluticasone 50 MCG/ACT nasal spray Commonly known as:  FLONASE Place 2 sprays into both nostrils daily. What changed:  when to take this   meclizine 25 MG tablet Commonly known as:  ANTIVERT Take 25 mg by mouth 3 (three) times daily as  needed for dizziness.   metFORMIN 500 MG tablet Commonly known as:  GLUCOPHAGE Take 1 tablet (500 mg total) by mouth 2 (two) times daily with a meal.   traMADol 50 MG tablet Commonly known as:  ULTRAM Take 1 tablet (50 mg total) by mouth every 6 (six) hours as needed for moderate pain.   Vitamin B-12 1000 MCG Subl Place 1,000 mcg under the tongue daily.            Durable Medical Equipment        Start     Ordered   02/02/17 1106  DME Walker rolling  Once    Question:  Patient needs a walker to treat with the following condition  Answer:  Status post total replacement of left hip   02/02/17 1106   02/02/17 1106  DME 3 n 1  Once     02/02/17 1106      Diagnostic Studies: Dg Pelvis Portable  Result Date: 02/02/2017 CLINICAL DATA:  Post left total hip replacement EXAM: PORTABLE PELVIS 1-2 VIEWS COMPARISON:  None. FINDINGS: Single frontal view of the pelvis submitted. There is left hip prosthesis with anatomic alignment. IMPRESSION: Left hip prosthesis with anatomic alignment. Electronically Signed   By: Lahoma Crocker M.D.   On: 02/02/2017 10:58   Dg C-arm 1-60 Min-no Report  Result Date: 02/02/2017 Fluoroscopy was utilized by the requesting physician.  No radiographic interpretation.   Dg Hip Operative Unilat W Or W/o Pelvis Left  Result Date: 02/02/2017 CLINICAL DATA:  Elective surgery.  Left total hip replacement EXAM: OPERATIVE LEFT HIP (WITH PELVIS IF PERFORMED) 2 VIEWS TECHNIQUE: Fluoroscopic spot image(s) were submitted for interpretation post-operatively. COMPARISON:  None. FINDINGS: Two intraoperative spot images demonstrate changes of left hip replacement. Normal AP alignment. No visible hardware or bony complicating feature. IMPRESSION: Left hip replacement without visible complicating feature. Electronically Signed   By: Rolm Baptise M.D.   On: 02/02/2017 10:02    Disposition: 01-Home or [place], MD. Schedule  an appointment as soon as possible for a visit in 2 week(s).   Specialty:  Orthopedic Surgery Contact information: [place] [address] [phone]            Signed: Erskine Emery 02/03/2017, 9:24 AM

## 2017-02-03 NOTE — Progress Notes (Signed)
Pt d/c'd home with [facility] for PT. No DME needs. AVS reviewed and "My Chart" discussed with pt. Pt capable of verbalizing medications, signs and symptoms of infection, and follow-up appointments. Remains hemodynamically stable. No signs and symptoms of distress. Educated pt to return to ER in the case of SOB, dizziness, or chest pain.

## 2017-02-03 NOTE — Progress Notes (Signed)
Physical Therapy Treatment Patient Details Name: Jeremy Stone MRN: [id number] DOB: 1926-03-31 Today's Date: 02/03/2017    History of Present Illness Pt s/p L THR    PT Comments    Pt continues motivated but impulsive and progressing steadily with mobility.  Pt very eager for dc home this date.   Follow Up Recommendations  Home health PT     Equipment Recommendations  None recommended by PT    Recommendations for Other Services OT consult     Precautions / Restrictions Precautions Precautions: Fall Restrictions Weight Bearing Restrictions: No Other Position/Activity Restrictions: WBAT    Mobility  Bed Mobility Overal bed mobility: Needs Assistance Bed Mobility: Sit to Supine       Sit to supine: Min assist   General bed mobility comments: cues for sequence and assist to manage L LE  Transfers Overall transfer level: Needs assistance Equipment used: Rolling walker (2 wheeled) Transfers: Sit to/from Stand Sit to Stand: Min assist;Min guard         General transfer comment: cues for LE management and use of UEs to self assist  Ambulation/Gait Ambulation/Gait assistance: Min assist;Min guard Ambulation Distance (Feet): 180 Feet Assistive device: Rolling walker (2 wheeled) Gait Pattern/deviations: Step-through pattern;Decreased step length - right;Decreased step length - left;Shuffle;Trunk flexed Gait velocity: DECR Gait velocity interpretation: Below normal speed for age/gender General Gait Details: cues for sequence, posture and position from [employer]            Wheelchair Mobility    Modified Rankin (Stroke Patients Only)       Balance                                            Cognition Arousal/Alertness: Awake/alert Behavior During Therapy: WFL for tasks assessed/performed Overall Cognitive Status: Within Functional Limits for tasks assessed                                         Exercises Total Joint Exercises Ankle Circles/Pumps: AROM;Both;15 reps;Supine Quad Sets: AROM;Both;10 reps;Supine Heel Slides: AAROM;Left;20 reps;Supine Hip ABduction/ADduction: AAROM;Left;15 reps;Supine    General Comments        Pertinent Vitals/Pain Pain Assessment: 0-10 Pain Score: 5  Pain Location: L hip Pain Descriptors / Indicators: Aching;Sore Pain Intervention(s): Limited activity within patient's tolerance;Monitored during session;Premedicated before session;Ice applied    Home Living                      Prior Function            PT Goals (current goals can now be found in the care plan section) Acute Rehab PT Goals Patient Stated Goal: HOME ASAP PT Goal Formulation: With patient Time For Goal Achievement: 02/07/17 Potential to Achieve Goals: Good Progress towards PT goals: Progressing toward goals    Frequency    7X/week      PT Plan Current plan remains appropriate    Co-evaluation             End of Session Equipment Utilized During Treatment: Gait belt Activity Tolerance: Patient tolerated treatment well Patient left: in bed;with call bell/phone within reach;with bed alarm set Nurse Communication: Mobility status PT Visit Diagnosis: Unsteadiness on feet (R26.81);Difficulty in walking, not elsewhere classified (R26.2)  Time: 9450-3888 PT Time Calculation (min) (ACUTE ONLY): 30 min  Charges:  $Gait Training: 8-22 mins $Therapeutic Exercise: 8-22 mins                    G Codes:       Pg [phone]    Rielly Brunn 02/03/2017, 12:33 PM

## 2017-02-03 NOTE — Discharge Instructions (Signed)

## 2017-02-07 DIAGNOSIS — Z79891 Long term (current) use of opiate analgesic: Secondary | ICD-10-CM | POA: Diagnosis not present

## 2017-02-07 DIAGNOSIS — I10 Essential (primary) hypertension: Secondary | ICD-10-CM | POA: Diagnosis not present

## 2017-02-07 DIAGNOSIS — Z86711 Personal history of pulmonary embolism: Secondary | ICD-10-CM | POA: Diagnosis not present

## 2017-02-07 DIAGNOSIS — Z96642 Presence of left artificial hip joint: Secondary | ICD-10-CM | POA: Diagnosis not present

## 2017-02-07 DIAGNOSIS — Z7984 Long term (current) use of oral hypoglycemic drugs: Secondary | ICD-10-CM | POA: Diagnosis not present

## 2017-02-07 DIAGNOSIS — E119 Type 2 diabetes mellitus without complications: Secondary | ICD-10-CM | POA: Diagnosis not present

## 2017-02-07 DIAGNOSIS — G4733 Obstructive sleep apnea (adult) (pediatric): Secondary | ICD-10-CM | POA: Diagnosis not present

## 2017-02-07 DIAGNOSIS — E785 Hyperlipidemia, unspecified: Secondary | ICD-10-CM | POA: Diagnosis not present

## 2017-02-07 DIAGNOSIS — M48062 Spinal stenosis, lumbar region with neurogenic claudication: Secondary | ICD-10-CM | POA: Diagnosis not present

## 2017-02-07 DIAGNOSIS — M5116 Intervertebral disc disorders with radiculopathy, lumbar region: Secondary | ICD-10-CM | POA: Diagnosis not present

## 2017-02-07 DIAGNOSIS — Z87891 Personal history of nicotine dependence: Secondary | ICD-10-CM | POA: Diagnosis not present

## 2017-02-07 DIAGNOSIS — Z471 Aftercare following joint replacement surgery: Secondary | ICD-10-CM | POA: Diagnosis not present

## 2017-02-07 DIAGNOSIS — Z7982 Long term (current) use of aspirin: Secondary | ICD-10-CM | POA: Diagnosis not present

## 2017-02-08 ENCOUNTER — Telehealth (INDEPENDENT_AMBULATORY_CARE_PROVIDER_SITE_OTHER): Payer: Self-pay | Admitting: Orthopaedic Surgery

## 2017-02-08 NOTE — Telephone Encounter (Signed)
Patients wife called a little concerned for her husbands left leg. He recently had hip surgery but he's experiencing a lot of swelling from his knee down to his ankle. She was wanting to know what she can do to help the swelling go down. CB # [phone]

## 2017-02-08 NOTE — Telephone Encounter (Signed)
[place] FOR 2X ONE [place] AND 3X [place] THE SECOND WEEK   [facility]- [phone]

## 2017-02-08 NOTE — Telephone Encounter (Signed)
Verbal order left on VM  

## 2017-02-08 NOTE — Telephone Encounter (Signed)
Wife states patient has no pain at all. States he has been sitting at his desk a lot. Per Ninfa Linden I told her to make sure he's elevating his leg, continuing his aspirin and may use ice. If he gets any pain in the calf to call me right away

## 2017-02-09 DIAGNOSIS — M5116 Intervertebral disc disorders with radiculopathy, lumbar region: Secondary | ICD-10-CM | POA: Diagnosis not present

## 2017-02-09 DIAGNOSIS — E785 Hyperlipidemia, unspecified: Secondary | ICD-10-CM | POA: Diagnosis not present

## 2017-02-09 DIAGNOSIS — Z86711 Personal history of pulmonary embolism: Secondary | ICD-10-CM | POA: Diagnosis not present

## 2017-02-09 DIAGNOSIS — Z96642 Presence of left artificial hip joint: Secondary | ICD-10-CM | POA: Diagnosis not present

## 2017-02-09 DIAGNOSIS — Z87891 Personal history of nicotine dependence: Secondary | ICD-10-CM | POA: Diagnosis not present

## 2017-02-09 DIAGNOSIS — Z79891 Long term (current) use of opiate analgesic: Secondary | ICD-10-CM | POA: Diagnosis not present

## 2017-02-09 DIAGNOSIS — I10 Essential (primary) hypertension: Secondary | ICD-10-CM | POA: Diagnosis not present

## 2017-02-09 DIAGNOSIS — E119 Type 2 diabetes mellitus without complications: Secondary | ICD-10-CM | POA: Diagnosis not present

## 2017-02-09 DIAGNOSIS — Z471 Aftercare following joint replacement surgery: Secondary | ICD-10-CM | POA: Diagnosis not present

## 2017-02-09 DIAGNOSIS — M48062 Spinal stenosis, lumbar region with neurogenic claudication: Secondary | ICD-10-CM | POA: Diagnosis not present

## 2017-02-09 DIAGNOSIS — Z7982 Long term (current) use of aspirin: Secondary | ICD-10-CM | POA: Diagnosis not present

## 2017-02-09 DIAGNOSIS — G4733 Obstructive sleep apnea (adult) (pediatric): Secondary | ICD-10-CM | POA: Diagnosis not present

## 2017-02-09 DIAGNOSIS — Z7984 Long term (current) use of oral hypoglycemic drugs: Secondary | ICD-10-CM | POA: Diagnosis not present

## 2017-02-12 DIAGNOSIS — Z7984 Long term (current) use of oral hypoglycemic drugs: Secondary | ICD-10-CM | POA: Diagnosis not present

## 2017-02-12 DIAGNOSIS — Z86711 Personal history of pulmonary embolism: Secondary | ICD-10-CM | POA: Diagnosis not present

## 2017-02-12 DIAGNOSIS — E119 Type 2 diabetes mellitus without complications: Secondary | ICD-10-CM | POA: Diagnosis not present

## 2017-02-12 DIAGNOSIS — I10 Essential (primary) hypertension: Secondary | ICD-10-CM | POA: Diagnosis not present

## 2017-02-12 DIAGNOSIS — E785 Hyperlipidemia, unspecified: Secondary | ICD-10-CM | POA: Diagnosis not present

## 2017-02-12 DIAGNOSIS — Z96642 Presence of left artificial hip joint: Secondary | ICD-10-CM | POA: Diagnosis not present

## 2017-02-12 DIAGNOSIS — G4733 Obstructive sleep apnea (adult) (pediatric): Secondary | ICD-10-CM | POA: Diagnosis not present

## 2017-02-12 DIAGNOSIS — Z7982 Long term (current) use of aspirin: Secondary | ICD-10-CM | POA: Diagnosis not present

## 2017-02-12 DIAGNOSIS — M48062 Spinal stenosis, lumbar region with neurogenic claudication: Secondary | ICD-10-CM | POA: Diagnosis not present

## 2017-02-12 DIAGNOSIS — Z87891 Personal history of nicotine dependence: Secondary | ICD-10-CM | POA: Diagnosis not present

## 2017-02-12 DIAGNOSIS — Z471 Aftercare following joint replacement surgery: Secondary | ICD-10-CM | POA: Diagnosis not present

## 2017-02-12 DIAGNOSIS — M5116 Intervertebral disc disorders with radiculopathy, lumbar region: Secondary | ICD-10-CM | POA: Diagnosis not present

## 2017-02-12 DIAGNOSIS — Z79891 Long term (current) use of opiate analgesic: Secondary | ICD-10-CM | POA: Diagnosis not present

## 2017-02-13 ENCOUNTER — Telehealth: Payer: Self-pay | Admitting: Pulmonary Disease

## 2017-02-13 DIAGNOSIS — G4733 Obstructive sleep apnea (adult) (pediatric): Secondary | ICD-10-CM

## 2017-02-13 NOTE — Telephone Encounter (Signed)
OK to change mask Change to auto 8-14 cm & report back in 2 weeks if dryness better

## 2017-02-13 NOTE — Telephone Encounter (Signed)
Spoke with pt's wife (dpr on file), staets that pt having difficulty with new cpap mask- having lots of dryness in mouth after wearing mask for over an hour- lips sticking to teeth, mouth dryness overall. .  Pt feels like he cannot breathe d/t the high pressure and dryness.  Pt will take mask off after approx 1 hour of wear time for relief.   Pt's wife went to [facility] and is having mask switched from a large size to a medium size.  Pt's wife also increased humidity from 4 to 6 with no difference in mouth dryness.  Also notes that pt had hip replacement X1 week ago.   Pt's wife requesting response today.   Pt requesting further recs.  RA please advise.  Thanks.

## 2017-02-13 NOTE — Telephone Encounter (Signed)
Spoke with pt's wife regarding CPAP. Will send RX to [facility]. Both patient and wife are aware.

## 2017-02-14 ENCOUNTER — Encounter: Payer: Self-pay | Admitting: Pulmonary Disease

## 2017-02-14 ENCOUNTER — Ambulatory Visit (INDEPENDENT_AMBULATORY_CARE_PROVIDER_SITE_OTHER): Payer: PPO | Admitting: Physician Assistant

## 2017-02-14 DIAGNOSIS — G4733 Obstructive sleep apnea (adult) (pediatric): Secondary | ICD-10-CM | POA: Diagnosis not present

## 2017-02-14 DIAGNOSIS — Z96642 Presence of left artificial hip joint: Secondary | ICD-10-CM

## 2017-02-14 NOTE — Progress Notes (Signed)
Jeremy Stone returns today follow-up left total hip arthroplasty 12 days postop. He is overall doing okay. He states range of motion is not exactly what he would like. Thinks strength overall is good. His is ambulating with a cane. He is having difficulty with transfers. He states he has swelling lateral aspect of the hip and also both legs. No shortness breath fevers chills. He is on aspirin 325 twice daily. Does have a sore throat.   Physical exam: Left hip large seroma. Surgical incisions healing well no signs of infection. Left calf supple nontender. He has diminished range of motion of the hip but is able to flex his hip.  Plan: We'll have him work on transfers with physical therapy who as he is having difficulty getting in and out of a car. Did write him a prescription for compression hose needs to wear these during the day as needed. He'll stop his 325 mg aspirin twice daily go back to his 81 mg aspirin which is taking prior to surgery. He'll follow with [place] in 1 month sooner if there is any questions concerns or recurrent seroma. Today left hip seroma was aspirated 160 mL of normal serous angle this fluid was obtained.

## 2017-02-15 ENCOUNTER — Ambulatory Visit (INDEPENDENT_AMBULATORY_CARE_PROVIDER_SITE_OTHER): Payer: PPO | Admitting: Pulmonary Disease

## 2017-02-15 ENCOUNTER — Ambulatory Visit: Payer: PPO | Admitting: Pulmonary Disease

## 2017-02-15 ENCOUNTER — Encounter: Payer: Self-pay | Admitting: Pulmonary Disease

## 2017-02-15 VITALS — BP 136/82 | HR 83 | Ht 69.0 in | Wt 187.0 lb

## 2017-02-15 DIAGNOSIS — R011 Cardiac murmur, unspecified: Secondary | ICD-10-CM

## 2017-02-15 DIAGNOSIS — G4733 Obstructive sleep apnea (adult) (pediatric): Secondary | ICD-10-CM

## 2017-02-15 NOTE — Assessment & Plan Note (Signed)
Appears to be moderate aortic stenosis on echo But this does not seem to be causing his current problems

## 2017-02-15 NOTE — Progress Notes (Signed)
   Subjective:    Patient ID: Jeremy Stone, male    DOB: August 24, 1926, 83 y.o.   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   Chief Complaint  Patient presents with  . Acute Visit    pt having difficulty with cpap- was given incorrect mask size by [facility], difficulty tolerating cpap pressure.    He has been maintained on CPAP since 2007 with good results. Initially was maintained on 14 cm but over the last few years has been on 12 cm We got him a new machine in 11/2016 and abdominal was reviewed on a follow-up visit in 12/2016 which showed excellent control of events on 12 cm. He now reports that machine is not working well and the year seems to cut off at times after running for an hour or 2. He also reports a large leak from where the mask meets the hose. He has gone back to using his old machine now. I reviewed another download which again shows good control of events and 12 cm without any residuals but does show a moderate amount of leak Wife also states that he obtained 3 large masks from [facility] and only one medium, the large mass seems to have leak -this was wrongly given to him by DME  Significant tests/ events  PSG  04/2006 showed severe obstructive sleep apnea with AHI of 52 events per hour and lowest desaturation of 85%. This was corrected by CPAP of 14 cm with a large full face mask.   echo mod AS      Review of Systems neg for any significant sore throat, dysphagia, itching, sneezing, nasal congestion or excess/ purulent secretions, fever, chills, sweats, unintended wt loss, pleuritic or exertional cp, hempoptysis, orthopnea pnd or change in chronic leg swelling. Also denies presyncope, palpitations, heartburn, abdominal pain, nausea, vomiting, diarrhea or change in bowel or urinary habits, dysuria,hematuria, rash, arthralgias, visual complaints, headache, numbness weakness or ataxia.     Objective:   Physical Exam  Gen.  Pleasant, well-nourished, in no distress ENT - no thrush, no post nasal drip Neck: No JVD, no thyromegaly, no carotid bruits Lungs: no use of accessory muscles, no dullness to percussion, clear without rales or rhonchi  Cardiovascular: Rhythm regular, heart sounds  normal, no murmurs or gallops, no peripheral edema Musculoskeletal: No deformities, no cyanosis or clubbing        Assessment & Plan:

## 2017-02-15 NOTE — Patient Instructions (Signed)
call the sleep lab at [phone] to set up a mask fitting session and the sleep technician will check out her machine in the same time  Use medium fullface mask and-year-old machine in the meantime We will ask [facility] to check out your machine too

## 2017-02-15 NOTE — Assessment & Plan Note (Signed)
set up a mask fitting session and the sleep technician will check out her machine in the same time  Use medium fullface mask and-year-old machine in the meantime We will ask [facility] to check out your machine too Pressure of 12 cm seems to be adequate. He will use his old machine in the meantime to his nutrition is fixed.  Weight loss encouraged, compliance with goal of at least 4-6 hrs every night is the expectation. Advised against medications with sedative side effects Cautioned against driving when sleepy - understanding that sleepiness will vary on a day to day basis

## 2017-02-19 ENCOUNTER — Telehealth (INDEPENDENT_AMBULATORY_CARE_PROVIDER_SITE_OTHER): Payer: Self-pay | Admitting: Orthopaedic Surgery

## 2017-02-19 NOTE — Telephone Encounter (Signed)
Debbie from [facility] called requesting 2x week for 3 weeks. CB # [phone]

## 2017-02-20 ENCOUNTER — Ambulatory Visit (HOSPITAL_BASED_OUTPATIENT_CLINIC_OR_DEPARTMENT_OTHER): Payer: PPO | Attending: Pulmonary Disease | Admitting: Radiology

## 2017-02-20 DIAGNOSIS — G4733 Obstructive sleep apnea (adult) (pediatric): Secondary | ICD-10-CM

## 2017-02-20 NOTE — Telephone Encounter (Signed)
Verbal order left on VM  

## 2017-02-21 DIAGNOSIS — Z79891 Long term (current) use of opiate analgesic: Secondary | ICD-10-CM | POA: Diagnosis not present

## 2017-02-21 DIAGNOSIS — Z87891 Personal history of nicotine dependence: Secondary | ICD-10-CM | POA: Diagnosis not present

## 2017-02-21 DIAGNOSIS — Z86711 Personal history of pulmonary embolism: Secondary | ICD-10-CM | POA: Diagnosis not present

## 2017-02-21 DIAGNOSIS — M48062 Spinal stenosis, lumbar region with neurogenic claudication: Secondary | ICD-10-CM | POA: Diagnosis not present

## 2017-02-21 DIAGNOSIS — Z471 Aftercare following joint replacement surgery: Secondary | ICD-10-CM | POA: Diagnosis not present

## 2017-02-21 DIAGNOSIS — Z7982 Long term (current) use of aspirin: Secondary | ICD-10-CM | POA: Diagnosis not present

## 2017-02-21 DIAGNOSIS — E119 Type 2 diabetes mellitus without complications: Secondary | ICD-10-CM | POA: Diagnosis not present

## 2017-02-21 DIAGNOSIS — E785 Hyperlipidemia, unspecified: Secondary | ICD-10-CM | POA: Diagnosis not present

## 2017-02-21 DIAGNOSIS — Z96642 Presence of left artificial hip joint: Secondary | ICD-10-CM | POA: Diagnosis not present

## 2017-02-21 DIAGNOSIS — M5116 Intervertebral disc disorders with radiculopathy, lumbar region: Secondary | ICD-10-CM | POA: Diagnosis not present

## 2017-02-21 DIAGNOSIS — Z7984 Long term (current) use of oral hypoglycemic drugs: Secondary | ICD-10-CM | POA: Diagnosis not present

## 2017-02-21 DIAGNOSIS — I10 Essential (primary) hypertension: Secondary | ICD-10-CM | POA: Diagnosis not present

## 2017-02-21 DIAGNOSIS — G4733 Obstructive sleep apnea (adult) (pediatric): Secondary | ICD-10-CM | POA: Diagnosis not present

## 2017-02-22 ENCOUNTER — Ambulatory Visit (INDEPENDENT_AMBULATORY_CARE_PROVIDER_SITE_OTHER): Payer: PPO | Admitting: Physician Assistant

## 2017-02-22 DIAGNOSIS — Z96642 Presence of left artificial hip joint: Secondary | ICD-10-CM

## 2017-02-22 NOTE — Progress Notes (Signed)
[name] returns today follow-up of his left hip. He is now 20 days postop from a left total hip arthroplasty. He has recurrent seroma is asking for this to be draining. Otherwise he is slowly progressing towards improvement. No fevers chills.   Physical exam left hip surgical incision is healing well no abnormal erythema large seroma was present.   Plan left hip spell Betadine [name] necessary skin and then 140 mL of seroma fluid was aspirated patient tolerates well. He'll follow up at his regular scheduled appointment which is 6 weeks postop.

## 2017-02-26 DIAGNOSIS — G4733 Obstructive sleep apnea (adult) (pediatric): Secondary | ICD-10-CM | POA: Diagnosis not present

## 2017-02-28 DIAGNOSIS — E785 Hyperlipidemia, unspecified: Secondary | ICD-10-CM | POA: Diagnosis not present

## 2017-02-28 DIAGNOSIS — Z96642 Presence of left artificial hip joint: Secondary | ICD-10-CM | POA: Diagnosis not present

## 2017-02-28 DIAGNOSIS — G4733 Obstructive sleep apnea (adult) (pediatric): Secondary | ICD-10-CM | POA: Diagnosis not present

## 2017-02-28 DIAGNOSIS — Z86711 Personal history of pulmonary embolism: Secondary | ICD-10-CM | POA: Diagnosis not present

## 2017-02-28 DIAGNOSIS — Z7982 Long term (current) use of aspirin: Secondary | ICD-10-CM | POA: Diagnosis not present

## 2017-02-28 DIAGNOSIS — Z471 Aftercare following joint replacement surgery: Secondary | ICD-10-CM | POA: Diagnosis not present

## 2017-02-28 DIAGNOSIS — E119 Type 2 diabetes mellitus without complications: Secondary | ICD-10-CM | POA: Diagnosis not present

## 2017-02-28 DIAGNOSIS — M48062 Spinal stenosis, lumbar region with neurogenic claudication: Secondary | ICD-10-CM | POA: Diagnosis not present

## 2017-02-28 DIAGNOSIS — I10 Essential (primary) hypertension: Secondary | ICD-10-CM | POA: Diagnosis not present

## 2017-02-28 DIAGNOSIS — Z79891 Long term (current) use of opiate analgesic: Secondary | ICD-10-CM | POA: Diagnosis not present

## 2017-02-28 DIAGNOSIS — M5116 Intervertebral disc disorders with radiculopathy, lumbar region: Secondary | ICD-10-CM | POA: Diagnosis not present

## 2017-02-28 DIAGNOSIS — Z7984 Long term (current) use of oral hypoglycemic drugs: Secondary | ICD-10-CM | POA: Diagnosis not present

## 2017-02-28 DIAGNOSIS — Z87891 Personal history of nicotine dependence: Secondary | ICD-10-CM | POA: Diagnosis not present

## 2017-03-01 ENCOUNTER — Ambulatory Visit (INDEPENDENT_AMBULATORY_CARE_PROVIDER_SITE_OTHER): Payer: PPO | Admitting: Physician Assistant

## 2017-03-01 ENCOUNTER — Encounter (INDEPENDENT_AMBULATORY_CARE_PROVIDER_SITE_OTHER): Payer: Self-pay | Admitting: Physician Assistant

## 2017-03-01 DIAGNOSIS — Z96642 Presence of left artificial hip joint: Secondary | ICD-10-CM

## 2017-03-01 NOTE — Progress Notes (Signed)
Mr. Vandermeulen returns today follow-up of his left hip secondary to seroma. States overall that his mobility is getting better. He is able to get in and out of the vehicle easier. He is ambulating with a cane.  Physical exam left hip surgical incisions healing well. Definite seroma. Good range of motion of the left hip without pain  Plan: Left hip is prepped with Betadine , ethyl chloride was used anesthetize the skin and then aspiration of the seroma was performed a total of 80 mL of serosanguineous aspirate. Follow up at that is readily scheduled appointment in approximately 2 weeks with Dr. Ninfa Linden.

## 2017-03-02 DIAGNOSIS — S0101XA Laceration without foreign body of scalp, initial encounter: Secondary | ICD-10-CM | POA: Diagnosis not present

## 2017-03-02 DIAGNOSIS — W19XXXA Unspecified fall, initial encounter: Secondary | ICD-10-CM | POA: Diagnosis not present

## 2017-03-09 DIAGNOSIS — E119 Type 2 diabetes mellitus without complications: Secondary | ICD-10-CM | POA: Diagnosis not present

## 2017-03-09 DIAGNOSIS — Z7982 Long term (current) use of aspirin: Secondary | ICD-10-CM | POA: Diagnosis not present

## 2017-03-09 DIAGNOSIS — Z96642 Presence of left artificial hip joint: Secondary | ICD-10-CM | POA: Diagnosis not present

## 2017-03-09 DIAGNOSIS — Z471 Aftercare following joint replacement surgery: Secondary | ICD-10-CM | POA: Diagnosis not present

## 2017-03-09 DIAGNOSIS — M5116 Intervertebral disc disorders with radiculopathy, lumbar region: Secondary | ICD-10-CM | POA: Diagnosis not present

## 2017-03-09 DIAGNOSIS — E785 Hyperlipidemia, unspecified: Secondary | ICD-10-CM | POA: Diagnosis not present

## 2017-03-09 DIAGNOSIS — I10 Essential (primary) hypertension: Secondary | ICD-10-CM | POA: Diagnosis not present

## 2017-03-09 DIAGNOSIS — G4733 Obstructive sleep apnea (adult) (pediatric): Secondary | ICD-10-CM | POA: Diagnosis not present

## 2017-03-09 DIAGNOSIS — Z86711 Personal history of pulmonary embolism: Secondary | ICD-10-CM | POA: Diagnosis not present

## 2017-03-09 DIAGNOSIS — Z79891 Long term (current) use of opiate analgesic: Secondary | ICD-10-CM | POA: Diagnosis not present

## 2017-03-09 DIAGNOSIS — Z7984 Long term (current) use of oral hypoglycemic drugs: Secondary | ICD-10-CM | POA: Diagnosis not present

## 2017-03-09 DIAGNOSIS — M48062 Spinal stenosis, lumbar region with neurogenic claudication: Secondary | ICD-10-CM | POA: Diagnosis not present

## 2017-03-09 DIAGNOSIS — Z87891 Personal history of nicotine dependence: Secondary | ICD-10-CM | POA: Diagnosis not present

## 2017-03-14 ENCOUNTER — Ambulatory Visit (INDEPENDENT_AMBULATORY_CARE_PROVIDER_SITE_OTHER): Payer: PPO | Admitting: Orthopaedic Surgery

## 2017-03-14 DIAGNOSIS — Z96642 Presence of left artificial hip joint: Secondary | ICD-10-CM

## 2017-03-14 NOTE — Progress Notes (Signed)
Jeremy Stone 83 year old male who returns today status post left total hip arthroplasty 40 days postop. He is overall doing well. He is getting around well without any assistive device. He is no longer having any pain. Still has a small seroma but this is causing him no discomfort.  Physical exam left hip he has good range of motion the hip. He rises from a seated position easily. Surgical incisions well-healed no signs of infection. Very small seroma lateral aspect of hipinfection.  Plan: Follow-up in one year AP pelvis normal lateral view of the left hip at that time. We'll see him back sooner if there is any questions or concerns.

## 2017-03-20 DIAGNOSIS — F5102 Adjustment insomnia: Secondary | ICD-10-CM | POA: Diagnosis not present

## 2017-03-21 DIAGNOSIS — C4492 Squamous cell carcinoma of skin, unspecified: Secondary | ICD-10-CM | POA: Diagnosis not present

## 2017-03-21 DIAGNOSIS — L570 Actinic keratosis: Secondary | ICD-10-CM | POA: Diagnosis not present

## 2017-03-28 DIAGNOSIS — L570 Actinic keratosis: Secondary | ICD-10-CM | POA: Diagnosis not present

## 2017-03-29 DIAGNOSIS — G4733 Obstructive sleep apnea (adult) (pediatric): Secondary | ICD-10-CM | POA: Diagnosis not present

## 2017-04-11 ENCOUNTER — Ambulatory Visit: Payer: PPO | Admitting: Adult Health

## 2017-04-17 ENCOUNTER — Ambulatory Visit
Admission: RE | Admit: 2017-04-17 | Discharge: 2017-04-17 | Disposition: A | Discharge status: Home / Self Care | Payer: PPO | Source: Ambulatory Visit | Attending: Internal Medicine | Admitting: Internal Medicine

## 2017-04-17 ENCOUNTER — Other Ambulatory Visit: Payer: Self-pay | Admitting: Internal Medicine

## 2017-04-17 DIAGNOSIS — R269 Unspecified abnormalities of gait and mobility: Secondary | ICD-10-CM | POA: Diagnosis not present

## 2017-04-17 DIAGNOSIS — S0990XA Unspecified injury of head, initial encounter: Secondary | ICD-10-CM | POA: Diagnosis not present

## 2017-04-17 DIAGNOSIS — I10 Essential (primary) hypertension: Secondary | ICD-10-CM | POA: Diagnosis not present

## 2017-04-17 DIAGNOSIS — E119 Type 2 diabetes mellitus without complications: Secondary | ICD-10-CM | POA: Diagnosis not present

## 2017-04-17 DIAGNOSIS — Z7984 Long term (current) use of oral hypoglycemic drugs: Secondary | ICD-10-CM | POA: Diagnosis not present

## 2017-04-17 DIAGNOSIS — R270 Ataxia, unspecified: Secondary | ICD-10-CM

## 2017-04-18 ENCOUNTER — Other Ambulatory Visit: Payer: Self-pay | Admitting: Internal Medicine

## 2017-04-18 DIAGNOSIS — R269 Unspecified abnormalities of gait and mobility: Secondary | ICD-10-CM

## 2017-04-19 ENCOUNTER — Ambulatory Visit
Admission: RE | Admit: 2017-04-19 | Discharge: 2017-04-19 | Disposition: A | Discharge status: Home / Self Care | Payer: PPO | Source: Ambulatory Visit | Attending: Internal Medicine | Admitting: Internal Medicine

## 2017-04-19 DIAGNOSIS — R270 Ataxia, unspecified: Secondary | ICD-10-CM | POA: Diagnosis not present

## 2017-04-19 DIAGNOSIS — R269 Unspecified abnormalities of gait and mobility: Secondary | ICD-10-CM

## 2017-04-20 ENCOUNTER — Other Ambulatory Visit: Payer: Self-pay | Admitting: Internal Medicine

## 2017-04-20 DIAGNOSIS — I6350 Cerebral infarction due to unspecified occlusion or stenosis of unspecified cerebral artery: Secondary | ICD-10-CM

## 2017-04-20 DIAGNOSIS — I639 Cerebral infarction, unspecified: Secondary | ICD-10-CM

## 2017-04-23 ENCOUNTER — Ambulatory Visit
Admission: RE | Admit: 2017-04-23 | Discharge: 2017-04-23 | Disposition: A | Discharge status: Home / Self Care | Payer: PPO | Source: Ambulatory Visit | Attending: Internal Medicine | Admitting: Internal Medicine

## 2017-04-23 ENCOUNTER — Ambulatory Visit: Payer: PPO | Admitting: Physical Therapy

## 2017-04-23 DIAGNOSIS — I6350 Cerebral infarction due to unspecified occlusion or stenosis of unspecified cerebral artery: Secondary | ICD-10-CM

## 2017-04-23 DIAGNOSIS — I639 Cerebral infarction, unspecified: Secondary | ICD-10-CM

## 2017-04-23 DIAGNOSIS — I6523 Occlusion and stenosis of bilateral carotid arteries: Secondary | ICD-10-CM | POA: Diagnosis not present

## 2017-04-28 DIAGNOSIS — G4733 Obstructive sleep apnea (adult) (pediatric): Secondary | ICD-10-CM | POA: Diagnosis not present

## 2017-05-04 ENCOUNTER — Ambulatory Visit: Payer: PPO | Attending: Internal Medicine | Admitting: Rehabilitation

## 2017-05-04 ENCOUNTER — Encounter: Payer: Self-pay | Admitting: Rehabilitation

## 2017-05-04 DIAGNOSIS — R2689 Other abnormalities of gait and mobility: Secondary | ICD-10-CM

## 2017-05-04 DIAGNOSIS — R2681 Unsteadiness on feet: Secondary | ICD-10-CM | POA: Diagnosis not present

## 2017-05-04 DIAGNOSIS — R42 Dizziness and giddiness: Secondary | ICD-10-CM | POA: Insufficient documentation

## 2017-05-04 DIAGNOSIS — M6281 Muscle weakness (generalized): Secondary | ICD-10-CM | POA: Insufficient documentation

## 2017-05-04 NOTE — Therapy (Signed)
[facility] [address] [place], [place], [address] Phone: [phone]   Fax:  [phone]  Physical Therapy Evaluation  Patient Details  Name: Jeremy Stone MRN: [id number] Date of Birth: [birth year] Referring Provider: Lavone Orn, MD  Encounter Date: 05/04/2017      PT End of Session - 05/04/17 1320    Visit Number 1   Number of Visits 17   Date for PT Re-Evaluation 07/03/17   Authorization Type [employer] G code on every 10th visit   PT Start Time 1146   PT Stop Time 1235   PT Time Calculation (min) 49 min   Activity Tolerance Patient tolerated treatment well   Behavior During Therapy [facility] for tasks assessed/performed      Past Medical History:  Diagnosis Date  . GERD (gastroesophageal reflux disease)   . Hyperlipidemia   . Hypertension   . NIDDM (non-insulin dependent diabetes mellitus)   . OSA (obstructive sleep apnea)    uses CPAP  . Thyroid nodule   . Trigeminal neuralgia     Past Surgical History:  Procedure Laterality Date  . APPENDECTOMY    . TEE WITHOUT CARDIOVERSION N/A 05/01/2013   Procedure: TRANSESOPHAGEAL ECHOCARDIOGRAM (TEE);  Surgeon: Thayer Headings, MD;  Location: [place];  Service: Cardiovascular;  Laterality: N/A;  Need to make sure [facility] is arranged. Notifed RN at [facility] to do this on 04-30-2013  . TOTAL HIP ARTHROPLASTY Left 02/02/2017   Procedure: LEFT TOTAL HIP ARTHROPLASTY ANTERIOR APPROACH;  Surgeon: Mcarthur Rossetti, MD;  Location: [facility];  Service: Orthopedics;  Laterality: Left;  . TRANSESOPHAGEAL ECHOCARDIOGRAM W/O CARDIOVERSION  12/06/2016  . trigeminal surgery     07-22-07- [facility]    There were no vitals filed for this visit.       Subjective Assessment - 05/04/17 1150    Subjective "I've been through some physical therapy at home both for my hip and then for the stroke.  I want to work on my balance."    Patient is accompained by: Family member   [employer] activities;Walking   Patient Stated Goals "walk like I used to"    Currently in Pain? No/denies            [facility] PT Assessment - 05/04/17 0001      Assessment   Medical Diagnosis gait instability, CVA    Referring Provider Lavone Orn, MD   Onset Date/Surgical Date 02/02/17  CVA sometime following this, unsure of date   Hand Dominance Right   Prior Therapy Had HHPT following hip replacement      Precautions   Precautions Fall   Precaution Comments Hx of trigeminal neuralgia s/p surgery and L anterior approach THA 02/02/17     Balance Screen   Has the patient fallen in the past 6 months Yes   How many times? 5-8   Has the patient had a decrease in activity level because of a fear of falling?  No   Is the patient reluctant to leave their home because of a fear of falling?  No     Home Environment   Living Environment Private residence   Living Arrangements Spouse/significant other   Available Help at Discharge Family;Available 24 hours/day  leaves to run errands   Type of [place] to enter   Entrance Stairs-Number of Steps 3   Entrance Stairs-Rails Right;Left;Can reach both   Home Layout Two level  has  finished basement, limits amount he goes down   Alternate Level Stairs-Number of Steps 12   Alternate Level Stairs-Rails Right   Home Equipment Cane - single point;Grab bars - tub/shower;Shower seat - built in  Secondary school teacher, walk in shower      Prior Function   Level of Independence Independent   Vocation Self employed   Vocation Requirements 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    Leisure Loves to go out to eat, going to concerts     Cognition   Overall Cognitive Status Within Functional Limits for tasks assessed   Behaviors Poor frustration tolerance  per pt and wife reports, did not see during session     Sensation   Light Touch Impaired  Detail   Light Touch Impaired Details Impaired RLE;Impaired LLE  has been pre-diabetic, has some numbness/tingling   Hot/Cold Appears Intact   Proprioception Appears Intact     Coordination   Gross Motor Movements are Fluid and Coordinated Yes  in LEs   Fine Motor Movements are Fluid and Coordinated Yes   Heel Shin Test decreased fluidity on the L over R, however likely due to strength      ROM / Strength   AROM / PROM / Strength Strength     Strength   Overall Strength Within functional limits for tasks performed   Overall Strength Comments L hip strength grossly 4/5, note some increased hip flexion weakness also tends to compensate with posterior trunk lean.      Transfers   Transfers Sit to Stand;Stand to Sit   Sit to Stand 7: Independent   Five time sit to stand comments  11.78   Stand to Sit 7: Independent     Ambulation/Gait   Ambulation/Gait Yes   Ambulation/Gait Assistance 5: Supervision;4: Min guard   Ambulation/Gait Assistance Details Performed gait with cane x 115' at S level.  Note forward flexed posture, decreased L hip activation and decreased stride length.  Had pt ambulate short distance without AD.  Note more pronounced unsteadiness, requiring min/guard for safety.     Ambulation Distance (Feet) 115 Feet  then another 61'   Assistive device Straight cane;None   Gait Pattern Step-through pattern;Decreased stride length;Decreased weight shift to left;Shuffle;Lateral hip instability;Trunk flexed;Narrow base of support   Ambulation Surface Level;Indoor   Gait velocity 2.49 ft/sec with SPC   Stairs Yes   Stairs Assistance 5: Supervision   Stair Management Technique One rail Right;With cane;Alternating pattern;Forwards   Number of Stairs 4   Height of Stairs 6     Standardized Balance Assessment   Standardized Balance Assessment Berg Balance Test     Berg Balance Test   Sit to Stand Able to stand without using hands and stabilize independently   Standing  Unsupported Able to stand safely 2 minutes   Sitting with Back Unsupported but Feet Supported on Floor or Stool Able to sit safely and securely 2 minutes   Stand to Sit Sits safely with minimal use of hands   Transfers Able to transfer safely, minor use of hands   Standing Unsupported with Eyes Closed Able to stand 10 seconds with supervision   Standing Ubsupported with Feet Together Able to place feet together independently and stand for 1 minute with supervision   From Standing, Reach Forward with Outstretched Arm Can reach forward >12 cm safely (5")   From Standing Position, Pick up Object from Floor Able to pick up shoe, needs  supervision   From Standing Position, Turn to Look Behind Over each Shoulder Looks behind one side only/other side shows less weight shift   Turn 360 Degrees Needs close supervision or verbal cueing   Standing Unsupported, Alternately Place Feet on Step/Stool Able to complete >2 steps/needs minimal assist   Standing Unsupported, One Foot in Front Able to plae foot ahead of the other independently and hold 30 seconds   Standing on One Leg Tries to lift leg/unable to hold 3 seconds but remains standing independently   Total Score 41   Berg comment: 37-45 significant (>80%)             Objective measurements completed on examination: See above findings.                  PT Education - 05/04/17 1319    Education provided Yes   Education Details education on evaluation results, POC, goals   Person(s) Educated Patient;Spouse   Methods Explanation   Comprehension Verbalized understanding          PT Short Term Goals - 05/04/17 1329      PT SHORT TERM GOAL #1   Title Pt will be independent with initial HEP in order to indicate improved functional mobility and decreased fall risk.  (Target Date: 06/03/17)   Time 4   Period Weeks   Status New     PT SHORT TERM GOAL #2   Title Pt will improve walking speed to >/= 2.62 ft/sec w/ LRAD in order  to indicate pt is safe community ambulator.     Time 4   Period Weeks   Status New     PT SHORT TERM GOAL #3   Title Pt will improve BERG score to 45/56 in order to indicate decreased fall risk.     Time 4   Period Weeks   Status New     PT SHORT TERM GOAL #4   Title Pt will ambulate x 200' w/ LRAD at S level over indoor surfaces scanning enviornment and performing turns without overt LOB to indicate safe negotiation in home.     Time 4   Period Weeks   Status New     PT SHORT TERM GOAL #5   Title Will assess 6MWT and improve distance by 34' in order to indicate improved functional endurance.     Time 4   Period Weeks   Status New           PT Long Term Goals - 05/04/17 1332      PT LONG TERM GOAL #1   Title Pt will be independent with final HEP in order to indicate improved functional mobility and decreased fall risk.  (Target Date: 07/03/17)   Time 8   Period Weeks   Status New     PT LONG TERM GOAL #2   Title Pt will improve BERG balance score to 49/56 in order to indicate decreased fall risk.     Time 8   Period Weeks   Status New     PT LONG TERM GOAL #3   Title Pt will improve 6MWT distance by 150' from baseline in order to indicate improved functional endurance.     Time 8   Period Weeks   Status New     PT LONG TERM GOAL #4   Title Pt will ambulate 500' w/ LRAD at mod I level over unlevel paved outdoor surfaces (including ramp/curb) in order to indicate safe community negotiation  and return to leisure activities.     Time 8   Period Weeks   Status New                Plan - 05/08/2017 1321    Clinical Impression Statement Pt presents with recent L THA (direct anterior approach) and in the few weeks following, wife and pt note increased unsteadiness and falls.  Upon going to PCP had imaging done of brain which revealed L pontine CVA, unsure of actual date that this occured.  Pt with history of trigeminal neuralgia and again the THA on L side.  Upon  PT evaluation, note that gait speed is 2.48 ft/sec which is decreased for community ambulator, and BERG score of 41/56 indicative of significant fall risk.  Pt is of evolving presentation and moderate complexity.  Pt will benefit from skilled OP neuro PT in order to address deficits.     History and Personal Factors relevant to plan of care: see above    Clinical Presentation Evolving   Clinical Presentation due to: see above   Clinical Decision Making Moderate   Rehab Potential Good   Clinical Impairments Affecting Rehab Potential age and co-morbidities   PT Frequency 2x / week   PT Duration 8 weeks   PT Treatment/Interventions ADLs/Self Care Home Management;Gait training;DME Instruction;Stair training;Functional mobility training;Therapeutic activities;Therapeutic exercise;Balance training;Neuromuscular re-education;Patient/family education;Passive range of motion;Vestibular   PT Next Visit Plan 6MWT to better assess endurance, provide HEP for balance (narrow BOS, eyes closed, compliant surface), look more at vestibular symptoms as able, functional strength (esp in LLE), gait for improved stride length and posture   Consulted and Agree with Plan of Care Patient;Family member/caregiver   Family Member Consulted wife, connie      Patient will benefit from skilled therapeutic intervention in order to improve the following deficits and impairments:  Abnormal gait, Decreased activity tolerance, Decreased balance, Decreased mobility, Decreased safety awareness, Decreased strength, Dizziness, Impaired perceived functional ability, Impaired flexibility, Postural dysfunction, Impaired sensation  Visit Diagnosis: Unsteadiness on feet - Plan: PT plan of care cert/re-cert  Muscle weakness (generalized) - Plan: PT plan of care cert/re-cert  Other abnormalities of gait and mobility - Plan: PT plan of care cert/re-cert  Dizziness and giddiness - Plan: PT plan of care cert/re-cert      G-Codes -  05/08/17 1643    Functional Assessment Tool Used (Outpatient Only) BERG: 41/56   Functional Limitation Mobility: Walking and moving around   Mobility: Walking and Moving Around Current Status (A2130) At least 20 percent but less than 40 percent impaired, limited or restricted   Mobility: Walking and Moving Around Goal Status (Q6578) At least 1 percent but less than 20 percent impaired, limited or restricted       Problem List Patient Active Problem List   Diagnosis Date Noted  . Status post total replacement of left hip 02/02/2017  . Unilateral primary osteoarthritis, left hip 11/22/2016  . Lumbar radiculopathy 10/04/2016  . Spinal stenosis of lumbar region with neurogenic claudication 10/04/2016  . Radiculopathy due to lumbar intervertebral disc disorder 10/04/2016  . Unspecified constipation 02/04/2014  . Rhinitis, non-allergic 10/23/2013  . PRIMARY HYPERCOAGULABLE STATE 08/18/2009  . CHRONIC PULMONARY EMBOLISM 08/09/2009  . HYPERTENSION 08/04/2009  . DRY MOUTH 06/09/2008  . CARDIAC MURMUR, SYSTOLIC 46/96/2952  . URINARY FREQUENCY 06/09/2008  . DEPRESSIVE DISORDER 04/01/2008  . Obstructive sleep apnea 04/01/2008  . INSOMNIA-SLEEP DISORDER-UNSPEC 04/01/2008  . DM, UNCOMPLICATED, TYPE II 84/13/2440  . TRIGEMINAL NEURALGIA 07/08/2007  .  HYPERLIPIDEMIA 07/01/2007  . GERD 07/01/2007    Cameron Sprang, PT, MPT [facility] [address] [place], [place], [address] Phone: [phone]   Fax:  [phone] 05/04/17, 4:46 PM  Name: Jeremy Stone MRN: [id number] Date of Birth: 1926-04-27

## 2017-05-09 DIAGNOSIS — H5211 Myopia, right eye: Secondary | ICD-10-CM | POA: Diagnosis not present

## 2017-05-09 DIAGNOSIS — E119 Type 2 diabetes mellitus without complications: Secondary | ICD-10-CM | POA: Diagnosis not present

## 2017-05-09 DIAGNOSIS — H5202 Hypermetropia, left eye: Secondary | ICD-10-CM | POA: Diagnosis not present

## 2017-05-09 DIAGNOSIS — Z961 Presence of intraocular lens: Secondary | ICD-10-CM | POA: Diagnosis not present

## 2017-05-11 ENCOUNTER — Ambulatory Visit: Payer: PPO

## 2017-05-11 DIAGNOSIS — R42 Dizziness and giddiness: Secondary | ICD-10-CM

## 2017-05-11 DIAGNOSIS — R2681 Unsteadiness on feet: Secondary | ICD-10-CM

## 2017-05-11 DIAGNOSIS — R2689 Other abnormalities of gait and mobility: Secondary | ICD-10-CM

## 2017-05-11 NOTE — Therapy (Signed)
[facility] [address] [place], [place], [address] Phone: [phone]   Fax:  [phone]  Physical Therapy Treatment  Patient Details  Name: Jeremy Stone MRN: [id number] Date of Birth: Dec 03, 1925 Referring Provider: Lavone Orn, MD  Encounter Date: 05/11/2017      PT End of Session - 05/11/17 1035    Visit Number 2   Number of Visits 17   Date for PT Re-Evaluation 07/03/17   Authorization Type [employer] Adv G code on every 10th visit   PT Start Time 0930   PT Stop Time 1027   PT Time Calculation (min) 57 min   Equipment Utilized During Treatment --  min guard to S prn   Activity Tolerance Patient tolerated treatment well   Behavior During Therapy [facility] for tasks assessed/performed      Past Medical History:  Diagnosis Date  . GERD (gastroesophageal reflux disease)   . Hyperlipidemia   . Hypertension   . NIDDM (non-insulin dependent diabetes mellitus)   . OSA (obstructive sleep apnea)    uses CPAP  . Thyroid nodule   . Trigeminal neuralgia     Past Surgical History:  Procedure Laterality Date  . APPENDECTOMY    . TEE WITHOUT CARDIOVERSION N/A 05/01/2013   Procedure: TRANSESOPHAGEAL ECHOCARDIOGRAM (TEE);  Surgeon: Thayer Headings, MD;  Location: [facility];  Service: Cardiovascular;  Laterality: N/A;  Need to make sure [facility] is arranged. Notifed RN at [employer] to do this on 04-30-2013  . TOTAL HIP ARTHROPLASTY Left 02/02/2017   Procedure: LEFT TOTAL HIP ARTHROPLASTY ANTERIOR APPROACH;  Surgeon: Mcarthur Rossetti, MD;  Location: [facility];  Service: Orthopedics;  Laterality: Left;  . TRANSESOPHAGEAL ECHOCARDIOGRAM W/O CARDIOVERSION  12/06/2016  . trigeminal surgery     07-22-07- [facility]    There were no vitals filed for this visit.      Subjective Assessment - 05/11/17 0934    Subjective Pt and wife reported he's been more unsteady this morning. Pt experienced LOB episode during amb. Into gym, and  used wall for support, along with SPC and min guard from PT.  Pt reports he has hx of labrynthitis.   Patient is accompained by: Family member  wife: Marlowe Kays   Patient Stated Goals "walk like I used to"    Currently in Pain? No/denies            [facility] Assessment - 05/11/17 0938      6 Minute Walk- Baseline   6 Minute Walk- Baseline yes   BP (mmHg) 143/77   HR (bpm) 86   02 Sat (%RA) 97 %   Modified Borg Scale for Dyspnea 0- Nothing at all   Perceived Rate of Exertion (Borg) 6-     6 Minute walk- Post Test   6 Minute Walk Post Test yes   BP (mmHg) 151/87   HR (bpm) 90   02 Sat (%RA) 95 %   Modified Borg Scale for Dyspnea 2- Mild shortness of breath   Perceived Rate of Exertion (Borg) 11- Fairly light     6 minute walk test results    Aerobic Endurance Distance Walked 610   Endurance additional comments Pt required min guard to S during test 2/2 LOB, which pt corrected with SPC and stepping. Pt required seated rest break after test 2/2 fatigue.                           Balance  Exercises - 05/11/17 0955      Balance Exercises: Standing   Standing Eyes Opened Narrow base of support (BOS);Wide (BOA);Head turns;3 reps;10 secs;30 secs;Solid surface   Standing Eyes Closed Narrow base of support (BOS);Wide (BOA);Head turns;Solid surface;3 reps;10 secs;30 secs   Other Standing Exercises Performed in corner with chair in front of pt for safety, with min guard to S: please see pt instructions for HEP details. Cues and demo for technique.      Self care:     PT Education - 05/11/17 1033    Education provided Yes   Education Details PT educated pt on CVA handout, and discussed common side effects (fatigue, depression) of CVA and strategies to prevent another CVA. PT discussed pt speaking with MD regarding depression-like symptoms/irritability and fatigue s/p CVA. PT educated pt on the importance of staying hydrated during the day, as pt's wife reports pt does  not drink enough water. PT provided pt with balance HEP.    Person(s) Educated Patient;Spouse   Methods Explanation;Demonstration;Verbal cues;Handout   Comprehension Returned demonstration;Verbalized understanding          PT Short Term Goals - 05/04/17 1329      PT SHORT TERM GOAL #1   Title Pt will be independent with initial HEP in order to indicate improved functional mobility and decreased fall risk.  (Target Date: 06/03/17)   Time 4   Period Weeks   Status New     PT SHORT TERM GOAL #2   Title Pt will improve walking speed to >/= 2.62 ft/sec w/ LRAD in order to indicate pt is safe community ambulator.     Time 4   Period Weeks   Status New     PT SHORT TERM GOAL #3   Title Pt will improve BERG score to 45/56 in order to indicate decreased fall risk.     Time 4   Period Weeks   Status New     PT SHORT TERM GOAL #4   Title Pt will ambulate x 200' w/ LRAD at S level over indoor surfaces scanning enviornment and performing turns without overt LOB to indicate safe negotiation in home.     Time 4   Period Weeks   Status New     PT SHORT TERM GOAL #5   Title Will assess 6MWT and improve distance by 23' in order to indicate improved functional endurance.     Time 4   Period Weeks   Status New           PT Long Term Goals - 05/04/17 1332      PT LONG TERM GOAL #1   Title Pt will be independent with final HEP in order to indicate improved functional mobility and decreased fall risk.  (Target Date: 07/03/17)   Time 8   Period Weeks   Status New     PT LONG TERM GOAL #2   Title Pt will improve BERG balance score to 49/56 in order to indicate decreased fall risk.     Time 8   Period Weeks   Status New     PT LONG TERM GOAL #3   Title Pt will improve 6MWT distance by 150' from baseline in order to indicate improved functional endurance.     Time 8   Period Weeks   Status New     PT LONG TERM GOAL #4   Title Pt will ambulate 500' w/ LRAD at mod I level over  unlevel paved outdoor  surfaces (including ramp/curb) in order to indicate safe community negotiation and return to leisure activities.     Time 8   Period Weeks   Status New               Plan - 05/11/17 1035    Clinical Impression Statement Pt was able to amb. 610' during 6MWT, which is below normal for his age group and gender (1368'). Pt continues to experience LOB and incr. postural sway during gait 2/2 narrow BOS and decr. heel strike. Pt experienced postural sway during activities which required incr. vestibular input. PT is recommending pt speak with MD regarding pt reported depression-like symptoms and irritability s/p CVA. Pt would continue to benefit from skilled PT to improve safety during functional mobility.    Rehab Potential Good   Clinical Impairments Affecting Rehab Potential age and co-morbidities   PT Frequency 2x / week   PT Duration 8 weeks   PT Treatment/Interventions ADLs/Self Care Home Management;Gait training;DME Instruction;Stair training;Functional mobility training;Therapeutic activities;Therapeutic exercise;Balance training;Neuromuscular re-education;Patient/family education;Passive range of motion;Vestibular   PT Next Visit Plan Progress to compliant surface balance training as tolerated, look more at vestibular symptoms as able, functional strength (esp in LLE), gait for improved stride length and posture   Consulted and Agree with Plan of Care Patient;Family member/caregiver   Family Member Consulted wife, connie      Patient will benefit from skilled therapeutic intervention in order to improve the following deficits and impairments:  Abnormal gait, Decreased activity tolerance, Decreased balance, Decreased mobility, Decreased safety awareness, Decreased strength, Dizziness, Impaired perceived functional ability, Impaired flexibility, Postural dysfunction, Impaired sensation  Visit Diagnosis: Other abnormalities of gait and mobility  Unsteadiness on  feet  Dizziness and giddiness     Problem List Patient Active Problem List   Diagnosis Date Noted  . Status post total replacement of left hip 02/02/2017  . Unilateral primary osteoarthritis, left hip 11/22/2016  . Lumbar radiculopathy 10/04/2016  . Spinal stenosis of lumbar region with neurogenic claudication 10/04/2016  . Radiculopathy due to lumbar intervertebral disc disorder 10/04/2016  . Unspecified constipation 02/04/2014  . Rhinitis, non-allergic 10/23/2013  . PRIMARY HYPERCOAGULABLE STATE 08/18/2009  . CHRONIC PULMONARY EMBOLISM 08/09/2009  . HYPERTENSION 08/04/2009  . DRY MOUTH 06/09/2008  . CARDIAC MURMUR, SYSTOLIC 93/90/3009  . URINARY FREQUENCY 06/09/2008  . DEPRESSIVE DISORDER 04/01/2008  . Obstructive sleep apnea 04/01/2008  . INSOMNIA-SLEEP DISORDER-UNSPEC 04/01/2008  . DM, UNCOMPLICATED, TYPE II 23/30/0762  . TRIGEMINAL NEURALGIA 07/08/2007  . HYPERLIPIDEMIA 07/01/2007  . GERD 07/01/2007    Saqib Cazarez L 05/11/2017, 10:41 AM  [facility] [address] [place], [place], [address] Phone: [phone]   Fax:  [phone]  Name: Jeremy Stone MRN: [id number] Date of Birth: July 06, 1926  Geoffry Paradise, PT,DPT 05/11/17 10:43 AM Phone: [phone] Fax: [phone]

## 2017-05-11 NOTE — Patient Instructions (Addendum)
Perform in corner with chair in front of you for safety OR at kitchen sink with chair behind you:  Feet Together, Head Motion - Eyes Closed    With eyes closed and feet together, move head slowly, up and down 5 times and side and side 5 times. Repeat __3__ times per session. Do __1__ sessions per day.  Copyright  VHI. All rights reserved.  Single Leg - Eyes Open    Holding support, lift right leg while maintaining balance over other leg. Progress to removing hands from support surface for longer periods of time. Perform with other leg. Hold__10__ seconds. Repeat __3__ times per session per leg. Do __1__ sessions per day.  Copyright  VHI. All rights reserved.    Ischemic Stroke An ischemic stroke (cerebrovascular accident, or CVA) is the sudden death of brain tissue that occurs when an area of the brain does not get enough oxygen. It is a medical emergency that must be treated right away. An ischemic stroke can cause permanent loss of brain function. This can cause problems with how different parts of your body function. What are the causes? This condition is caused by a decrease of oxygen supply to an area of the brain, which may be the result of:  A small blood clot (embolus) or a buildup of plaque in the blood vessels (atherosclerosis) that blocks blood flow in the brain.  An abnormal heart rhythm (atrial fibrillation).  A blocked or damaged artery in the head or neck.  What increases the risk? Certain factors may make you more likely to develop this condition. Some of these factors are things that you can change, such as:  Obesity.  Smoking cigarettes.  Taking oral birth control, especially if you also use tobacco.  Physical inactivity.  Excessive alcohol use.  Use of illegal drugs, especially cocaine and methamphetamine.  Other risk factors include:  High blood pressure (hypertension).  High cholesterol.  Diabetes mellitus.  Heart disease.  Being [place]  American, Native American, Hispanic, or [place] Native.  Being over age 91.  Family history of stroke.  Previous history of blood clots, stroke, or transient ischemic attack (TIA).  Sickle cell disease.  Being a woman with a history of preeclampsia.  Migraine headache.  Sleep apnea.  Irregular heartbeats, such as atrial fibrillation.  Chronic inflammatory diseases, such as rheumatoid arthritis or lupus.  Blood clotting disorders (hypercoagulable state).  What are the signs or symptoms? Symptoms of this condition usually develop suddenly, or you may notice them after waking up from sleep. Symptoms may include sudden:  Weakness or numbness in your face, arm, or leg, especially on one side of your body.  Trouble walking or difficulty moving your arms or legs.  Loss of balance or coordination.  Confusion.  Slurred speech (dysarthria).  Trouble speaking, understanding speech, or both (aphasia).  Vision changes-such as double vision, blurred vision, or loss of vision-inone or both eyes.  Dizziness.  Nausea and vomiting.  Severe headache with no known cause. The headache is often described as the worst headache ever experienced.  If possible, make note of the exact time that you last felt like your normal self and what time your symptoms started. Tell your health care provider. If symptoms come and go, this could be a sign of a warning stroke, or TIA. Get help right away, even if you feel better. How is this diagnosed? This condition may be diagnosed based on:  Your symptoms, your medical history, and a physical exam.  CT  scan of the brain.  MRI.  CT angiogram. This test uses a computer to take X-rays of your arteries. A dye may be injected into your blood to show the inside of your blood vessels more clearly.  MRI angiogram. This is a type of MRI that is used to evaluate the blood vessels.  Cerebral angiogram. This test uses X-rays and a dye to show the blood  vessels in the brain and neck.  You may need to see a health care provider who specializes in stroke care. A stroke specialist can be seen in person or through communication using telephone or television technology (telemedicine). Other tests may also be done to find the cause of the stroke, such as:  Electrocardiogram (ECG).  Continuous heart monitoring.  Echocardiogram.  Carotid ultrasound.  A scan of the brain circulation.  Blood tests.  Sleep study to check for sleep apnea.  How is this treated? Treatment for this condition will depend on the duration, severity, and cause of your symptoms and on the area of the brain affected. It is very important to get treatment at the first sign of stroke symptoms. Some treatments work better if they are done within 3-6 hours of the onset of stroke symptoms. These initial treatments may include:  Aspirin.  Medicines to control blood pressure.  Medicine given by injection to dissolve the blood clot (thrombolytic).  Treatments given directly to the affected artery to remove or dissolve the blood clot.  Other treatment options may include:  Oxygen.  IV fluids.  Medicines to thin the blood (anticoagulants or antiplatelets).  Procedures to increase blood flow.  Medicines and changes to your diet may be used to help treat and manage risk factors for stroke, such as diabetes, high cholesterol, and high blood pressure. After a stroke, you may work with physical, speech, mental health, or occupational therapists to help you recover. Follow these instructions at home: Medicines  Take over-the-counter and prescription medicines only as told by your health care provider.  If you were told to take a medicine to thin your blood, such as aspirin or an anticoagulant, take it exactly as told by your health care provider. ? Taking too much blood-thinning medicine can cause bleeding. ? If you do not take enough blood-thinning medicine, you will  not have the protection that you need against another stroke and other problems.  Understand the side effects of taking anticoagulant medicine. When taking this type of medicine, make sure you: ? Hold pressure over any cuts for longer than usual. ? Tell your dentist and other health care providers that you are taking anticoagulants before you have any procedures that may cause bleeding. ? Avoid activities that may cause trauma or injury. Eating and drinking  Follow instructions from your health care provider about diet.  Eat healthy foods.  If your ability to swallow was affected by the stroke, you may need to take steps to avoid choking, such as: ? Taking small bites when eating. ? Eating foods that are soft or pureed. Safety  Follow instructions from your health care team about physical activity.  Use a walker or cane as told by your health care provider.  Take steps to create a safe home environment in order to reduce the risk of falls. This may include: ? Having your home looked at by specialists. ? Installing grab bars in the bedroom and bathroom. ? Using safety equipment, such as raised toilets and a seat in the shower. General instructions  Do not  use any tobacco products, such as cigarettes, chewing tobacco, and e-cigarettes. If you need help quitting, ask your health care provider.  Limit alcohol intake to no more than 1 drink a day for nonpregnant women and 2 drinks a day for men. One drink equals 12 oz of beer, 5 oz of wine, or 1 oz of hard liquor.  If you need help to stop using drugs or alcohol, ask your health care provider about a referral to a program or specialist.  Maintain an active and healthy lifestyle. Get regular exercise as told by your health care provider.  Keep all follow-up visits as told by your health care provider, including visits with all specialists on your health care team. This is important. How is this prevented? Your risk of another stroke  can be decreased by managing high blood pressure, high cholesterol, diabetes, heart disease, sleep apnea, and obesity. It can also be decreased by quitting smoking, limiting alcohol, and staying physically active. Your health care provider will continue to work with you on measures to prevent short-term and long-term complications of stroke. Get help right away if: You have:  Sudden weakness or numbness in your face, arm, or leg, especially on one side of your body.  Sudden confusion.  Sudden trouble speaking, understanding, or both (aphasia).  Sudden trouble seeing with one or both eyes.  Sudden trouble walking or difficulty moving your arms or legs.  Sudden dizziness.  Sudden loss of balance or coordination.  Sudden, severe headache with no known cause.  A partial or total loss of consciousness.  A seizure. Any of these symptoms may represent a serious problem that is an emergency. Do not wait to see if the symptoms will go away. Get medical help right away. Call your local emergency services (911 in [place] Do not drive yourself to the hospital. This information is not intended to replace advice given to you by your health care provider. Make sure you discuss any questions you have with your health care provider. Document Released: 10/09/2005 Document Revised: 03/21/2016 Document Reviewed: 01/05/2016 Elsevier Interactive Patient Education  2017 [employer].

## 2017-05-14 ENCOUNTER — Encounter: Payer: Self-pay | Admitting: Rehabilitation

## 2017-05-14 ENCOUNTER — Ambulatory Visit: Payer: PPO | Admitting: Rehabilitation

## 2017-05-14 DIAGNOSIS — R2681 Unsteadiness on feet: Secondary | ICD-10-CM

## 2017-05-14 DIAGNOSIS — R2689 Other abnormalities of gait and mobility: Secondary | ICD-10-CM

## 2017-05-14 DIAGNOSIS — M6281 Muscle weakness (generalized): Secondary | ICD-10-CM

## 2017-05-14 DIAGNOSIS — R42 Dizziness and giddiness: Secondary | ICD-10-CM

## 2017-05-14 NOTE — Therapy (Signed)
[facility] [address] [place], [place], [address] Phone: [phone]   Fax:  [phone]  Physical Therapy Treatment  Patient Details  Name: Jeremy Stone MRN: [id number] Date of Birth: [birth year] Referring Provider: Lavone Orn, MD  Encounter Date: 05/14/2017      PT End of Session - 05/14/17 1257    Visit Number 3   Number of Visits 17   Date for PT Re-Evaluation 07/03/17   Authorization Type [employer] Adv G code on every 10th visit   PT Start Time 1149   PT Stop Time 1235   PT Time Calculation (min) 46 min   Equipment Utilized During Treatment --  min guard to S prn   Activity Tolerance Patient tolerated treatment well   Behavior During Therapy [facility] for tasks assessed/performed      Past Medical History:  Diagnosis Date  . GERD (gastroesophageal reflux disease)   . Hyperlipidemia   . Hypertension   . NIDDM (non-insulin dependent diabetes mellitus)   . OSA (obstructive sleep apnea)    uses CPAP  . Thyroid nodule   . Trigeminal neuralgia     Past Surgical History:  Procedure Laterality Date  . APPENDECTOMY    . TEE WITHOUT CARDIOVERSION N/A 05/01/2013   Procedure: TRANSESOPHAGEAL ECHOCARDIOGRAM (TEE);  Surgeon: Thayer Headings, MD;  Location: [facility];  Service: Cardiovascular;  Laterality: N/A;  Need to make sure [facility] is arranged. Notifed RN at [employer] to do this on 04-30-2013  . TOTAL HIP ARTHROPLASTY Left 02/02/2017   Procedure: LEFT TOTAL HIP ARTHROPLASTY ANTERIOR APPROACH;  Surgeon: Mcarthur Rossetti, MD;  Location: [facility];  Service: Orthopedics;  Laterality: Left;  . TRANSESOPHAGEAL ECHOCARDIOGRAM W/O CARDIOVERSION  12/06/2016  . trigeminal surgery     07-22-07- [facility]    There were no vitals filed for this visit.      Subjective Assessment - 05/14/17 1152    Subjective Pt feels more dizzy and more unsteady.  Note wife having to hold onto him during all gait.    Patient is  accompained by: Family member   Limitations House hold activities;Walking   Patient Stated Goals "walk like I used to"    Currently in Pain? No/denies                         [facility] Adult PT Treatment/Exercise - 05/14/17 0001      Self-Care   Self-Care Other Self-Care Comments   Other Self-Care Comments  Pt reporting increased dizziness and unsteadiness today.  Verbalized that he had 2 glasses of wine last night and stayed up later.  Also wife reports that he is not taking in enough water.  Educated that it is likley that the labyrinthitis is flaring back up due to these things plus recent CVA.  Educated that hydration is very important.  Continue to note that he has extremely limited ROM in neck in all ranges and reports that this is due to surgery for trigeminal neuralgia and being placed in "funny" position for several hours.  Upon looking back at notes/imaging and pt reporting that his back spontaneously fused.  It seems that this is what also happened in his neck.  Educated to speak with MD regarding MRI results (has an appt on Thursday) but educated how decreased head motion will play a part in increased vestibular symptoms.  Pt and wife verbalized understanding.  Provided pt with gaze stabilization and turning with compensation to improve  safety and also went over and recommended use of RW as he is very unsteady and at a higher fall risk at this time.  Pt verbalized understanding.       Neuro Re-ed    Neuro Re-ed Details  Gaze stabilization in seated position with horiztonal head movements first.  Note that he is so very limited in this plane that this is unable to provoke any symptoms, but when performing vertical head movements, reports increased symptoms esp when looking down.  Iniitated HEP for this, see pt instruction.  Also provided compensatory turning strategy during gait with RW during session.  Pt instructed to look to target, turn head then body when making a turn and  do perform slowly.                  PT Education - 05/14/17 1256    Education provided Yes   Education Details see self care section    Person(s) Educated Patient;Spouse   Methods Explanation   Comprehension Verbalized understanding          PT Short Term Goals - 05/04/17 1329      PT SHORT TERM GOAL #1   Title Pt will be independent with initial HEP in order to indicate improved functional mobility and decreased fall risk.  (Target Date: 06/03/17)   Time 4   Period Weeks   Status New     PT SHORT TERM GOAL #2   Title Pt will improve walking speed to >/= 2.62 ft/sec w/ LRAD in order to indicate pt is safe community ambulator.     Time 4   Period Weeks   Status New     PT SHORT TERM GOAL #3   Title Pt will improve BERG score to 45/56 in order to indicate decreased fall risk.     Time 4   Period Weeks   Status New     PT SHORT TERM GOAL #4   Title Pt will ambulate x 200' w/ LRAD at S level over indoor surfaces scanning enviornment and performing turns without overt LOB to indicate safe negotiation in home.     Time 4   Period Weeks   Status New     PT SHORT TERM GOAL #5   Title Will assess 6MWT and improve distance by 17' in order to indicate improved functional endurance.     Time 4   Period Weeks   Status New           PT Long Term Goals - 05/04/17 1332      PT LONG TERM GOAL #1   Title Pt will be independent with final HEP in order to indicate improved functional mobility and decreased fall risk.  (Target Date: 07/03/17)   Time 8   Period Weeks   Status New     PT LONG TERM GOAL #2   Title Pt will improve BERG balance score to 49/56 in order to indicate decreased fall risk.     Time 8   Period Weeks   Status New     PT LONG TERM GOAL #3   Title Pt will improve 6MWT distance by 150' from baseline in order to indicate improved functional endurance.     Time 8   Period Weeks   Status New     PT LONG TERM GOAL #4   Title Pt will ambulate  500' w/ LRAD at mod I level over unlevel paved outdoor surfaces (including ramp/curb) in order to indicate  safe community negotiation and return to leisure activities.     Time 8   Period Weeks   Status New               Plan - 05/14/17 1258    Clinical Impression Statement Pt presents today with increased unsteadiness and dizziness.  Also found out about spontaneous fusing of joints (in low back and probably in neck) also contributing to vestibular symptoms.  Educated on this and provided initial HEP.  See self care for education given.    Rehab Potential Good   Clinical Impairments Affecting Rehab Potential age and co-morbidities   PT Frequency 2x / week   PT Duration 8 weeks   PT Treatment/Interventions ADLs/Self Care Home Management;Gait training;DME Instruction;Stair training;Functional mobility training;Therapeutic activities;Therapeutic exercise;Balance training;Neuromuscular re-education;Patient/family education;Passive range of motion;Vestibular   PT Next Visit Plan Assess HEP and vestibular symptoms (esp gaze stabilization) Progress to compliant surface balance training as tolerated, look more at vestibular symptoms as able, functional strength (esp in LLE), gait for improved stride length and posture   Consulted and Agree with Plan of Care Patient;Family member/caregiver   Family Member Consulted wife, connie      Patient will benefit from skilled therapeutic intervention in order to improve the following deficits and impairments:  Abnormal gait, Decreased activity tolerance, Decreased balance, Decreased mobility, Decreased safety awareness, Decreased strength, Dizziness, Impaired perceived functional ability, Impaired flexibility, Postural dysfunction, Impaired sensation  Visit Diagnosis: Other abnormalities of gait and mobility  Unsteadiness on feet  Dizziness and giddiness  Muscle weakness (generalized)     Problem List Patient Active Problem List   Diagnosis  Date Noted  . Status post total replacement of left hip 02/02/2017  . Unilateral primary osteoarthritis, left hip 11/22/2016  . Lumbar radiculopathy 10/04/2016  . Spinal stenosis of lumbar region with neurogenic claudication 10/04/2016  . Radiculopathy due to lumbar intervertebral disc disorder 10/04/2016  . Unspecified constipation 02/04/2014  . Rhinitis, non-allergic 10/23/2013  . PRIMARY HYPERCOAGULABLE STATE 08/18/2009  . CHRONIC PULMONARY EMBOLISM 08/09/2009  . HYPERTENSION 08/04/2009  . DRY MOUTH 06/09/2008  . CARDIAC MURMUR, SYSTOLIC 81/19/1478  . URINARY FREQUENCY 06/09/2008  . DEPRESSIVE DISORDER 04/01/2008  . Obstructive sleep apnea 04/01/2008  . INSOMNIA-SLEEP DISORDER-UNSPEC 04/01/2008  . DM, UNCOMPLICATED, TYPE II 29/56/2130  . TRIGEMINAL NEURALGIA 07/08/2007  . HYPERLIPIDEMIA 07/01/2007  . GERD 07/01/2007    Cameron Sprang, PT, MPT [facility] [address] [place], [place], [address] Phone: [phone]   Fax:  [phone] 05/14/17, 1:15 PM  Name: Jeremy Stone MRN: [id number] Date of Birth: August 03, 1926

## 2017-05-14 NOTE — Patient Instructions (Signed)
Gaze Stabilization: Sitting    Keeping eyes on target on wall 3' feet away, tilt head down 15-30 and move head up and down for _15_ seconds.  Do this sitting.  Remember, I only want you to increase your dizziness 3-4 points above where you start.  We want to make you somewhat dizzy, but no overload your system.   Do __1-2__ sessions per day.   Copyright  VHI. All rights reserved.   Turning in Place: Compensatory Strategy    Standing in place, first move eyes to target at eye level located to right side. Keeping eyes on target, turn head then body toward target. This is what you are to do when walking and making turns or turning to sit down onto chair.     Copyright  VHI. All rights reserved.

## 2017-05-16 ENCOUNTER — Ambulatory Visit: Payer: PPO

## 2017-05-16 DIAGNOSIS — R2681 Unsteadiness on feet: Secondary | ICD-10-CM | POA: Diagnosis not present

## 2017-05-16 DIAGNOSIS — R2689 Other abnormalities of gait and mobility: Secondary | ICD-10-CM

## 2017-05-16 DIAGNOSIS — R42 Dizziness and giddiness: Secondary | ICD-10-CM

## 2017-05-16 NOTE — Therapy (Signed)
[facility] [address] [place], [place], [address] Phone: [phone]   Fax:  [phone]  Physical Therapy Treatment  Patient Details  Name: Jeremy Stone MRN: [id number] Date of Birth: 05/22/1926 Referring Provider: Lavone Orn, MD  Encounter Date: 05/16/2017      PT End of Session - 05/16/17 1025    Visit Number 4   Number of Visits 17   Date for PT Re-Evaluation 07/03/17   Authorization Type [employer] Adv G code on every 10th visit   PT Start Time 0931   PT Stop Time 1014   PT Time Calculation (min) 43 min   Equipment Utilized During Treatment --  min guard to S prn   Activity Tolerance Patient tolerated treatment well   Behavior During Therapy [facility] for tasks assessed/performed      Past Medical History:  Diagnosis Date  . GERD (gastroesophageal reflux disease)   . Hyperlipidemia   . Hypertension   . NIDDM (non-insulin dependent diabetes mellitus)   . OSA (obstructive sleep apnea)    uses CPAP  . Thyroid nodule   . Trigeminal neuralgia     Past Surgical History:  Procedure Laterality Date  . APPENDECTOMY    . TEE WITHOUT CARDIOVERSION N/A 05/01/2013   Procedure: TRANSESOPHAGEAL ECHOCARDIOGRAM (TEE);  Surgeon: Thayer Headings, MD;  Location: [facility];  Service: Cardiovascular;  Laterality: N/A;  Need to make sure [facility] is arranged. Notifed RN at [employer] to do this on 04-30-2013  . TOTAL HIP ARTHROPLASTY Left 02/02/2017   Procedure: LEFT TOTAL HIP ARTHROPLASTY ANTERIOR APPROACH;  Surgeon: Mcarthur Rossetti, MD;  [facility];  Service: Orthopedics;  Laterality: Left;  . TRANSESOPHAGEAL ECHOCARDIOGRAM W/O CARDIOVERSION  12/06/2016  . trigeminal surgery     07-22-07- [facility]    There were no vitals filed for this visit.      Subjective Assessment - 05/16/17 0934    Subjective Pt reported dizziness was severe on Monday, so pt's wife purchased a walker and lift chair to assist pt when  dizziness occurs. Pt feels unsteady when he takes Amlodipine. Pt has seen an ophthalmogist but not a neuro ophthalmologist.   Patient is accompained by: Family member   Limitations House hold activities;Walking   Patient Stated Goals "walk like I used to"    Currently in Pain? No/denies                Vestibular Assessment - 05/16/17 0937      Vestibular Assessment   General Observation Pt has very limited Cx ROM.     Symptom Behavior   Type of Dizziness Unsteady with head/body turns   Frequency of Dizziness Daily   Duration of Dizziness A few minutes   Aggravating Factors --  Incr. Unsteadiness after taking Amlodipine in the evening.   Relieving Factors No known relieving factors     Occulomotor Exam   Occulomotor Alignment Normal   Spontaneous Absent   Gaze-induced Absent   Smooth Pursuits Comment  diplopia in R and L upper quadrants   Saccades Intact   Comment Pt reported diplopia approx. 6 inches from nose. Pt unable to perform B HIT 2/2 limited cx ROM. Convergence: pt reports diplopia approx. 6 inches from nose.     Vestibulo-Occular Reflex   VOR 1 Head Only (x 1 viewing) Limited Cx rotation prevents pt from performing VOR adequately. Pt reported target movement during vertical head nods.    VOR Cancellation Normal     Orthostatics  BP supine (x 5 minutes) 160/89   HR supine (x 5 minutes) 81   BP sitting 134/78  first measure did not work, had to take second measure   HR sitting 81   BP standing (after 1 minute) 155/79   HR standing (after 1 minute) 89   BP standing (after 3 minutes) 158/84   HR standing (after 3 minutes) 88   Orthostatics Comment Pt reported lightheadedness during supine to sit txf.                     Self Care:     PT Education - 05/16/17 1023    Education provided Yes   Education Details PT discussed vestibular exam findings, including education on orthostatic hypotension. PT reiterated the importance of staying  hydrated and waiting for lightheadedness to subside prior to standing/walking for safety. PT discussed seeing a neuro ophthalmologist regarding diplopia-PT will provide pt with info next session and send note to Dr. Laurann [place].    Person(s) Educated Patient;Spouse   Methods Explanation   Comprehension Verbalized understanding          PT Short Term Goals - 05/04/17 1329      PT SHORT TERM GOAL #1   Title Pt will be independent with initial HEP in order to indicate improved functional mobility and decreased fall risk.  (Target Date: 06/03/17)   Time 4   Period Weeks   Status New     PT SHORT TERM GOAL #2   Title Pt will improve walking speed to >/= 2.62 ft/sec w/ LRAD in order to indicate pt is safe community ambulator.     Time 4   Period Weeks   Status New     PT SHORT TERM GOAL #3   Title Pt will improve BERG score to 45/56 in order to indicate decreased fall risk.     Time 4   Period Weeks   Status New     PT SHORT TERM GOAL #4   Title Pt will ambulate x 200' w/ LRAD at S level over indoor surfaces scanning enviornment and performing turns without overt LOB to indicate safe negotiation in home.     Time 4   Period Weeks   Status New     PT SHORT TERM GOAL #5   Title Will assess 6MWT and improve distance by 46' in order to indicate improved functional endurance.     Time 4   Period Weeks   Status New           PT Long Term Goals - 05/04/17 1332      PT LONG TERM GOAL #1   Title Pt will be independent with final HEP in order to indicate improved functional mobility and decreased fall risk.  (Target Date: 07/03/17)   Time 8   Period Weeks   Status New     PT LONG TERM GOAL #2   Title Pt will improve BERG balance score to 49/56 in order to indicate decreased fall risk.     Time 8   Period Weeks   Status New     PT LONG TERM GOAL #3   Title Pt will improve 6MWT distance by 150' from baseline in order to indicate improved functional endurance.     Time 8    Period Weeks   Status New     PT LONG TERM GOAL #4   Title Pt will ambulate 500' w/ LRAD at mod I level over unlevel  paved outdoor surfaces (including ramp/curb) in order to indicate safe community negotiation and return to leisure activities.     Time 8   Period Weeks   Status New               Plan - 05/16/17 1028    Clinical Impression Statement Today's skilled session focused on vestibular assessment and pt education. Pt experienced a significant decr. in both systolic and diastolic BP during orthostatic hypotension testing, and lightheadedness during supine to sit txf. Pt's dizziness is likely multifactorial, as pt's s/s consistent with orthostatsis, vestibular hypofunction, limited cervical ROM, and diplopia during convergence (6 inches out from nose) and during smooth pursuits in R and L upper quadrant. PT will send noted to MD regarding orthostatsis, elevated resting BP, and referral to neuro ophthalmologist. Continue with POC.    Rehab Potential Good   Clinical Impairments Affecting Rehab Potential age and co-morbidities   PT Frequency 2x / week   PT Duration 8 weeks   PT Treatment/Interventions ADLs/Self Care Home Management;Gait training;DME Instruction;Stair training;Functional mobility training;Therapeutic activities;Therapeutic exercise;Balance training;Neuromuscular re-education;Patient/family education;Passive range of motion;Vestibular   PT Next Visit Plan Assess HEP and vestibular symptoms (esp gaze stabilization) Progress to compliant surface balance training as tolerated, look more at vestibular symptoms as able, functional strength (esp in LLE), gait for improved stride length and posture   Consulted and Agree with Plan of Care Patient;Family member/caregiver   Family Member Consulted wife, connie      Patient will benefit from skilled therapeutic intervention in order to improve the following deficits and impairments:  Abnormal gait, Decreased activity tolerance,  Decreased balance, Decreased mobility, Decreased safety awareness, Decreased strength, Dizziness, Impaired perceived functional ability, Impaired flexibility, Postural dysfunction, Impaired sensation  Visit Diagnosis: Dizziness and giddiness  Other abnormalities of gait and mobility     Problem List Patient Active Problem List   Diagnosis Date Noted  . Status post total replacement of left hip 02/02/2017  . Unilateral primary osteoarthritis, left hip 11/22/2016  . Lumbar radiculopathy 10/04/2016  . Spinal stenosis of lumbar region with neurogenic claudication 10/04/2016  . Radiculopathy due to lumbar intervertebral disc disorder 10/04/2016  . Unspecified constipation 02/04/2014  . Rhinitis, non-allergic 10/23/2013  . PRIMARY HYPERCOAGULABLE STATE 08/18/2009  . CHRONIC PULMONARY EMBOLISM 08/09/2009  . HYPERTENSION 08/04/2009  . DRY MOUTH 06/09/2008  . CARDIAC MURMUR, SYSTOLIC 73/53/2992  . URINARY FREQUENCY 06/09/2008  . DEPRESSIVE DISORDER 04/01/2008  . Obstructive sleep apnea 04/01/2008  . INSOMNIA-SLEEP DISORDER-UNSPEC 04/01/2008  . DM, UNCOMPLICATED, TYPE II 42/68/3419  . TRIGEMINAL NEURALGIA 07/08/2007  . HYPERLIPIDEMIA 07/01/2007  . GERD 07/01/2007    Francelia Mclaren L 05/16/2017, 10:33 AM  [facility] [address] [place], [place], [address] Phone: [phone]   Fax:  [phone]  Name: Jeremy Stone MRN: [id number] Date of Birth: 07/26/1926  Geoffry Paradise, PT,DPT 05/16/17 10:34 AM Phone: [phone] Fax: [phone]

## 2017-05-16 NOTE — Patient Instructions (Addendum)
Hypotension As your heart beats, it forces blood through your body. This force is called blood pressure. If you have hypotension, you have low blood pressure. When your blood pressure is too low, you may not get enough blood to your brain. You may feel weak, feel light-headed, have a fast heartbeat, or even pass out (faint). Follow these instructions at home: Eating and drinking  Drink enough fluids to keep your pee (urine) clear or pale yellow.  Eat a healthy diet, and follow instructions from your doctor about eating or drinking restrictions. A healthy diet includes: ? Fresh fruits and vegetables. ? Whole grains. ? Low-fat (lean) meats. ? Low-fat dairy products.  Eat extra salt only as told. Do not add extra salt to your diet unless your doctor tells you to.  Eat small meals often.  Avoid standing up quickly after you eat. Medicines  Take over-the-counter and prescription medicines only as told by your doctor. ? Follow instructions from your doctor about changing how much you take (the dosage) of your medicines, if this applies. ? Do not stop or change your medicine on your own. General instructions  Wear compression stockings as told by your doctor.  Get up slowly from lying down or sitting.  Avoid hot showers and a lot of heat as told by your doctor.  Return to your normal activities as told by your doctor. Ask what activities are safe for you.  Do not use any products that contain nicotine or tobacco, such as cigarettes and e-cigarettes. If you need help quitting, ask your doctor.  Keep all follow-up visits as told by your doctor. This is important. Contact a doctor if:  You throw up (vomit).  You have watery poop (diarrhea).  You have a fever for more than 2-3 days.  You feel more thirsty than normal.  You feel weak and tired. Get help right away if:  You have chest pain.  You have a fast or irregular heartbeat.  You lose feeling (get numbness) in any part  of your body.  You cannot move your arms or your legs.  You have trouble talking.  You get sweaty or feel light-headed.  You faint.  You have trouble breathing.  You have trouble staying awake.  You feel confused. This information is not intended to replace advice given to you by your health care provider. Make sure you discuss any questions you have with your health care provider. Document Released: 01/03/2010 Document Revised: 06/27/2016 Document Reviewed: 06/27/2016 Elsevier Interactive Patient Education  2017 [employer]   

## 2017-05-17 ENCOUNTER — Ambulatory Visit: Payer: PPO | Admitting: Rehabilitation

## 2017-05-17 DIAGNOSIS — G4733 Obstructive sleep apnea (adult) (pediatric): Secondary | ICD-10-CM | POA: Diagnosis not present

## 2017-05-17 DIAGNOSIS — I10 Essential (primary) hypertension: Secondary | ICD-10-CM | POA: Diagnosis not present

## 2017-05-17 DIAGNOSIS — I6322 Cerebral infarction due to unspecified occlusion or stenosis of basilar arteries: Secondary | ICD-10-CM | POA: Diagnosis not present

## 2017-05-22 ENCOUNTER — Ambulatory Visit: Payer: PPO | Admitting: Physical Therapy

## 2017-05-23 ENCOUNTER — Ambulatory Visit: Payer: PPO | Attending: Internal Medicine | Admitting: Physical Therapy

## 2017-05-23 ENCOUNTER — Encounter: Payer: Self-pay | Admitting: Physical Therapy

## 2017-05-23 DIAGNOSIS — R42 Dizziness and giddiness: Secondary | ICD-10-CM | POA: Diagnosis not present

## 2017-05-23 DIAGNOSIS — M6281 Muscle weakness (generalized): Secondary | ICD-10-CM | POA: Insufficient documentation

## 2017-05-23 DIAGNOSIS — R2689 Other abnormalities of gait and mobility: Secondary | ICD-10-CM | POA: Diagnosis not present

## 2017-05-23 DIAGNOSIS — R2681 Unsteadiness on feet: Secondary | ICD-10-CM

## 2017-05-24 NOTE — Therapy (Signed)
[facility] [address] [place], [place], [address] Phone: [phone]   Fax:  [phone]  Physical Therapy Treatment  Patient Details  Name: Jeremy Stone MRN: [id number] Date of Birth: 1926-04-14 Referring Provider: Lavone Orn, MD  Encounter Date: 05/23/2017      PT End of Session - 05/23/17 1539    Visit Number 5   Number of Visits 17   Date for PT Re-Evaluation 07/03/17   Authorization Type [employer] Adv G code on every 10th visit   PT Start Time 1533   PT Stop Time 1615   PT Time Calculation (min) 42 min   Equipment Utilized During Treatment Gait belt   Activity Tolerance Patient tolerated treatment well   Behavior During Therapy [facility] for tasks assessed/performed      Past Medical History:  Diagnosis Date  . GERD (gastroesophageal reflux disease)   . Hyperlipidemia   . Hypertension   . NIDDM (non-insulin dependent diabetes mellitus)   . OSA (obstructive sleep apnea)    uses CPAP  . Thyroid nodule   . Trigeminal neuralgia     Past Surgical History:  Procedure Laterality Date  . APPENDECTOMY    . TEE WITHOUT CARDIOVERSION N/A 05/01/2013   Procedure: TRANSESOPHAGEAL ECHOCARDIOGRAM (TEE);  Surgeon: Thayer Headings, MD;  Location: [facility];  Service: Cardiovascular;  Laterality: N/A;  Need to make sure [facility] is arranged. Notifed RN at [employer] to do this on 04-30-2013  . TOTAL HIP ARTHROPLASTY Left 02/02/2017   Procedure: LEFT TOTAL HIP ARTHROPLASTY ANTERIOR APPROACH;  Surgeon: Mcarthur Rossetti, MD;  Location: [facility];  Service: Orthopedics;  Laterality: Left;  . TRANSESOPHAGEAL ECHOCARDIOGRAM W/O CARDIOVERSION  12/06/2016  . trigeminal surgery     07-22-07- [facility]    There were no vitals filed for this visit.      Subjective Assessment - 05/23/17 1536    Subjective No new complaints. Has increased his water intake- spouse has bought him a water bottle with a straw that he now uses daily. No  falls.    Patient is accompained by: Family member   Limitations House hold activities;Walking   Patient Stated Goals "walk like I used to"    Currently in Pain? No/denies             [facility] Adult PT Treatment/Exercise - 05/23/17 1605      Transfers   Transfers Sit to Stand;Stand to Sit   Sit to Stand 7: Independent   Stand to Sit 7: Independent     Ambulation/Gait   Ambulation/Gait Yes   Ambulation/Gait Assistance 5: Supervision;4: Min guard   Ambulation/Gait Assistance Details pt arrived without an AD and did not use one when walking from activity to activity. pt noted to take short, shuffled steps a times this way. used straight cane with rubber quad tip for gait training toward end of session with emphasis on tall posture, increased bil step length, increased base of support to decrease scissoring steps and on cane placement with gait. Pt and spouse liked the rubber quad tip and plan to look into one for his cane at home.    Ambulation Distance (Feet) 100 Feet  x1, plus around gym no AD; 230 with cane   Assistive device Straight cane   Gait Pattern Step-through pattern;Decreased stride length;Decreased weight shift to left;Shuffle;Lateral hip instability;Trunk flexed;Narrow base of support   Ambulation Surface Level;Indoor             Balance Exercises - 05/23/17  1555      Balance Exercises: Standing   Standing Eyes Closed Narrow base of support (BOS);Wide (BOA);Head turns;Foam/compliant surface;Other reps (comment);Limitations;20 secs   Balance Beam standing with feet across blue foam beam: alternating fwd stepping to floor and back onto beam x 10 reps each side, alternating backwards stepping to floor and back onto beam x 10 reps each side. bil UE support with min assist as well, cues on posture, weight shifting and step length/height with activity.                             Balance Exercises: Standing   Standing Eyes Closed Limitations on 2 pillows in corner with  chair in front of pt for safety: wide base of support progressing to narrow base of support with all the following activities: EC no head movements, progressing to EC head movements left<>right, up<>down and diagonals both ways x 10 reps each. min guard to min assist for balance with no UE support.                                                        PT Education - 05/24/17 1625    Education Details use of rubber quad tip with cane for increaesed support with gait and to keep cane upright when not in use. was discovered pt uses hurry canes at home. educated pt/spouse to be careful when using this style as it can collasps easily if the foot of cane gets caught. Both reported this has already happened once. Spouse plans to look for a more sturdy cane for him to use.                                        Person(s) Educated Patient;Spouse   Methods Explanation;Demonstration   Comprehension Verbalized understanding;Returned demonstration          PT Short Term Goals - 05/04/17 1329      PT SHORT TERM GOAL #1   Title Pt will be independent with initial HEP in order to indicate improved functional mobility and decreased fall risk.  (Target Date: 06/03/17)   Time 4   Period Weeks   Status New     PT SHORT TERM GOAL #2   Title Pt will improve walking speed to >/= 2.62 ft/sec w/ LRAD in order to indicate pt is safe community ambulator.     Time 4   Period Weeks   Status New     PT SHORT TERM GOAL #3   Title Pt will improve BERG score to 45/56 in order to indicate decreased fall risk.     Time 4   Period Weeks   Status New     PT SHORT TERM GOAL #4   Title Pt will ambulate x 200' w/ LRAD at S level over indoor surfaces scanning enviornment and performing turns without overt LOB to indicate safe negotiation in home.     Time 4   Period Weeks   Status New     PT SHORT TERM GOAL #5   Title Will assess 6MWT and improve distance by 32' in order to indicate improved functional endurance.      Time  4   Period Weeks   Status New           PT Long Term Goals - 05/04/17 1332      PT LONG TERM GOAL #1   Title Pt will be independent with final HEP in order to indicate improved functional mobility and decreased fall risk.  (Target Date: 07/03/17)   Time 8   Period Weeks   Status New     PT LONG TERM GOAL #2   Title Pt will improve BERG balance score to 49/56 in order to indicate decreased fall risk.     Time 8   Period Weeks   Status New     PT LONG TERM GOAL #3   Title Pt will improve 6MWT distance by 150' from baseline in order to indicate improved functional endurance.     Time 8   Period Weeks   Status New     PT LONG TERM GOAL #4   Title Pt will ambulate 500' w/ LRAD at mod I level over unlevel paved outdoor surfaces (including ramp/curb) in order to indicate safe community negotiation and return to leisure activities.     Time 8   Period Weeks   Status New           Plan - 05/23/17 1539    Clinical Impression Statement Today's skilled session continued to focuse on high level balance with emphasis on increased vestiubular system imput and decreased visual imput with pt being significantly challenged. Pt did not report any dizziness with today's activities. Also continued to work on gait with emphasis on longer steps with gait today. Pt is making steady progress toward goals and should benefit from continued PT to progress toward unmet goals.                     Rehab Potential Good   Clinical Impairments Affecting Rehab Potential age and co-morbidities   PT Frequency 2x / week   PT Duration 8 weeks   PT Treatment/Interventions ADLs/Self Care Home Management;Gait training;DME Instruction;Stair training;Functional mobility training;Therapeutic activities;Therapeutic exercise;Balance training;Neuromuscular re-education;Patient/family education;Passive range of motion;Vestibular   PT Next Visit Plan Assess HEP and vestibular symptoms (esp gaze stabilization)  Progress to compliant surface balance training as tolerated, look more at vestibular symptoms as able, functional strength (esp in LLE), gait for improved stride length and posture   Consulted and Agree with Plan of Care Patient;Family member/caregiver   Family Member Consulted wife, connie      Patient will benefit from skilled therapeutic intervention in order to improve the following deficits and impairments:  Abnormal gait, Decreased activity tolerance, Decreased balance, Decreased mobility, Decreased safety awareness, Decreased strength, Dizziness, Impaired perceived functional ability, Impaired flexibility, Postural dysfunction, Impaired sensation  Visit Diagnosis: Unsteadiness on feet  Dizziness and giddiness  Other abnormalities of gait and mobility  Muscle weakness (generalized)     Problem List Patient Active Problem List   Diagnosis Date Noted  . Status post total replacement of left hip 02/02/2017  . Unilateral primary osteoarthritis, left hip 11/22/2016  . Lumbar radiculopathy 10/04/2016  . Spinal stenosis of lumbar region with neurogenic claudication 10/04/2016  . Radiculopathy due to lumbar intervertebral disc disorder 10/04/2016  . Unspecified constipation 02/04/2014  . Rhinitis, non-allergic 10/23/2013  . PRIMARY HYPERCOAGULABLE STATE 08/18/2009  . CHRONIC PULMONARY EMBOLISM 08/09/2009  . HYPERTENSION 08/04/2009  . DRY MOUTH 06/09/2008  . CARDIAC MURMUR, SYSTOLIC 94/70/9628  . URINARY FREQUENCY 06/09/2008  . DEPRESSIVE DISORDER 04/01/2008  .  Obstructive sleep apnea 04/01/2008  . INSOMNIA-SLEEP DISORDER-UNSPEC 04/01/2008  . DM, UNCOMPLICATED, TYPE II 73/57/8978  . TRIGEMINAL NEURALGIA 07/08/2007  . HYPERLIPIDEMIA 07/01/2007  . GERD 07/01/2007    Willow Ora, PTA, [facility] [address], [place], [place] [address] [phone] 05/24/17, 4:28 PM   Name: Jeremy Stone MRN: [id number] Date of Birth: 1926/08/17

## 2017-05-25 ENCOUNTER — Ambulatory Visit: Payer: PPO | Admitting: Physical Therapy

## 2017-05-28 ENCOUNTER — Ambulatory Visit: Payer: PPO | Admitting: Rehabilitation

## 2017-05-28 ENCOUNTER — Telehealth: Payer: Self-pay | Admitting: Rehabilitation

## 2017-05-28 ENCOUNTER — Encounter: Payer: Self-pay | Admitting: Rehabilitation

## 2017-05-28 DIAGNOSIS — R2689 Other abnormalities of gait and mobility: Secondary | ICD-10-CM

## 2017-05-28 DIAGNOSIS — R2681 Unsteadiness on feet: Secondary | ICD-10-CM | POA: Diagnosis not present

## 2017-05-28 DIAGNOSIS — M6281 Muscle weakness (generalized): Secondary | ICD-10-CM

## 2017-05-28 DIAGNOSIS — R42 Dizziness and giddiness: Secondary | ICD-10-CM

## 2017-05-28 NOTE — Telephone Encounter (Signed)
Entered in error

## 2017-05-28 NOTE — Patient Instructions (Addendum)
Feet Together, Head Motion - Eyes Open    With eyes open, feet together, move head slowly: up and down x 15 secs.  Don't worry about doing side to side because of your neck.   Repeat __3__ times per session. Do __2__ sessions per day.  Copyright  VHI. All rights reserved.   Feet Together, Varied Arm Positions - Eyes Closed    Stand with feet together and arms out. Close eyes and visualize upright position. Hold _20___ seconds. Repeat __3__ times per session. Do __2__ sessions per day.  **Start with arms by your side first and then as this gets easier, add the different arms positions to increase difficulty.**  Copyright  VHI. All rights reserved.   Feet Apart (Compliant Surface) Varied Arm Positions - Eyes Open    With eyes open, standing on compliant surface: ___pillow_____, feet shoulder width apart and arms out, look at a stationary object. Hold __20__ seconds. Repeat __3__ times per session. Do _2___ sessions per day.  **Again, add varied arm positions once standing with arms by your side gets easier.**  Copyright  VHI. All rights reserved.       Feet Together (Compliant Surface) Varied Arm Positions - Eyes Open    With eyes open, standing on compliant surface: __pillow______, feet together and arms out, look at a stationary object. Hold _20___ seconds. Repeat _3_ times per session. Do _2_ sessions per day.  Again, start with arms by your side and as you improve, add varied arm positions.    Copyright  VHI. All rights reserved.     This one is a last progression.  Don't do until you have mastered feet together eyes open on the pillow.   Feet Together (Compliant Surface) Varied Arm Positions - Eyes Closed    Stand on compliant surface: ___pillow _____ with feet together and arms out. Close eyes and visualize upright position. Hold_20___ seconds. Repeat _3___ times per session. Do __2__ sessions per day.  Copyright  VHI. All rights reserved.      Stroke  Support Group  [facility] ([facility]-There is a nurses station there that can guide you to the right spot) Aug 22nd 1145-1pm (Try to aim for every 3rd Wednesday) [phone] is the number at inpatient rehab where the meetings are held.

## 2017-05-28 NOTE — Therapy (Signed)
[facility] [address] [place], [place], [address] Phone: [phone]   Fax:  [phone]  Physical Therapy Treatment  Patient Details  Name: Jeremy Stone MRN: [id number] Date of Birth: 09-26-1926 Referring Provider: Lavone Orn, MD  Encounter Date: 05/28/2017      PT End of Session - 05/28/17 1650    Visit Number 6   Number of Visits 17   Date for PT Re-Evaluation 07/03/17   Authorization Type [employer] Adv G code on every 10th visit   PT Start Time 1103   PT Stop Time 1149   PT Time Calculation (min) 46 min   Equipment Utilized During Treatment Gait belt   Activity Tolerance Patient tolerated treatment well   Behavior During Therapy [facility] for tasks assessed/performed      Past Medical History:  Diagnosis Date  . GERD (gastroesophageal reflux disease)   . Hyperlipidemia   . Hypertension   . NIDDM (non-insulin dependent diabetes mellitus)   . OSA (obstructive sleep apnea)    uses CPAP  . Thyroid nodule   . Trigeminal neuralgia     Past Surgical History:  Procedure Laterality Date  . APPENDECTOMY    . TEE WITHOUT CARDIOVERSION N/A 05/01/2013   Procedure: TRANSESOPHAGEAL ECHOCARDIOGRAM (TEE);  Surgeon: Thayer Headings, MD;  Location: [facility];  Service: Cardiovascular;  Laterality: N/A;  Need to make sure [facility] is arranged. Notifed RN at [employer] to do this on 04-30-2013  . TOTAL HIP ARTHROPLASTY Left 02/02/2017   Procedure: LEFT TOTAL HIP ARTHROPLASTY ANTERIOR APPROACH;  Surgeon: Mcarthur Rossetti, MD;  Location[facility];  Service: Orthopedics;  Laterality: Left;  . TRANSESOPHAGEAL ECHOCARDIOGRAM W/O CARDIOVERSION  12/06/2016  . trigeminal surgery     07-22-07- [facility]    There were no vitals filed for this visit.      Subjective Assessment - 05/28/17 1104    Subjective Pt reports feeling okay but doesn't feel that therapy is helping.  Fell Sunday morning, hung up robe turned quickly and then  fell.    Patient is accompained by: Family member   Limitations House hold activities;Walking   Patient Stated Goals "walk like I used to"    Currently in Pain? No/denies        NMR:  Issued and went over HEP for corner balance tasks to challenge vestibular input.  Pt tolerated well, see pt instruction for details.    Self Care:  Pt verbalizing that he would like to end therapy soon as he does not feel that PT is benefiting him.  PT discussed deficits that pt has remaining and that she would like to continue to address these.  Wife asking questions about returning to gym to do leg exercises with trainer.  Feel that this is appropriate as long as he has S and assist as needed getting onto/off of equipment.  Both verbalized understanding.  Provided pt with extensive HEP today and how to progress exercises but would like to see for at least a few more visits to ensure he is doing them properly and they are providing appropriate challenge as well as continue to work on gait with cane, esp making turns safely.  Both verbalize understanding.  Also discussed with pt and spouse pts poor frustration and poor coping at home.  Feel that this would warrant referral to neuropsychology to address these issues.  Also recommended CVA support group and provided with this info.  Both verbalize understanding.  PT Education - 05/28/17 1648    Education provided Yes   Education Details Provided education to wife and pt regarding getting referral for neuropsych to cope with frustration issues at home, CVA support group in the hospital, and also discussed continuing therapy for at least a few more visits in order to assess compliance with balance exercises and safety with gait.     Person(s) Educated Patient;Spouse   Methods Explanation;Demonstration;Handout   Comprehension Verbalized understanding;Returned demonstration          PT Short Term Goals - 05/04/17 1329       PT SHORT TERM GOAL #1   Title Pt will be independent with initial HEP in order to indicate improved functional mobility and decreased fall risk.  (Target Date: 06/03/17)   Time 4   Period Weeks   Status New     PT SHORT TERM GOAL #2   Title Pt will improve walking speed to >/= 2.62 ft/sec w/ LRAD in order to indicate pt is safe community ambulator.     Time 4   Period Weeks   Status New     PT SHORT TERM GOAL #3   Title Pt will improve BERG score to 45/56 in order to indicate decreased fall risk.     Time 4   Period Weeks   Status New     PT SHORT TERM GOAL #4   Title Pt will ambulate x 200' w/ LRAD at S level over indoor surfaces scanning enviornment and performing turns without overt LOB to indicate safe negotiation in home.     Time 4   Period Weeks   Status New     PT SHORT TERM GOAL #5   Title Will assess 6MWT and improve distance by 45' in order to indicate improved functional endurance.     Time 4   Period Weeks   Status New           PT Long Term Goals - 05/04/17 1332      PT LONG TERM GOAL #1   Title Pt will be independent with final HEP in order to indicate improved functional mobility and decreased fall risk.  (Target Date: 07/03/17)   Time 8   Period Weeks   Status New     PT LONG TERM GOAL #2   Title Pt will improve BERG balance score to 49/56 in order to indicate decreased fall risk.     Time 8   Period Weeks   Status New     PT LONG TERM GOAL #3   Title Pt will improve 6MWT distance by 150' from baseline in order to indicate improved functional endurance.     Time 8   Period Weeks   Status New     PT LONG TERM GOAL #4   Title Pt will ambulate 500' w/ LRAD at mod I level over unlevel paved outdoor surfaces (including ramp/curb) in order to indicate safe community negotiation and return to leisure activities.     Time 8   Period Weeks   Status New               Plan - 05/28/17 1650    Clinical Impression Statement Skilled session  focused on addressing concerns regarding pt feeling PT is not helping, pt/spouse questions regarding return to the gym, providing education on referral to neuropsych and also CVA support group as well as issuing HEP for balance to challenge vestibular system.     Rehab Potential Good  Clinical Impairments Affecting Rehab Potential age and co-morbidities   PT Frequency 2x / week   PT Duration 8 weeks   PT Treatment/Interventions ADLs/Self Care Home Management;Gait training;DME Instruction;Stair training;Functional mobility training;Therapeutic activities;Therapeutic exercise;Balance training;Neuromuscular re-education;Patient/family education;Passive range of motion;Vestibular   PT Next Visit Plan Begin to look at [place], Assess HEP as pt wants to D/C from therapy Raquel Sarna provided last visit and how to progress, see pt instruction), also need to work on gait with cane, esp making turns.     Consulted and Agree with Plan of Care Patient;Family member/caregiver   Family Member Consulted wife, connie      Patient will benefit from skilled therapeutic intervention in order to improve the following deficits and impairments:  Abnormal gait, Decreased activity tolerance, Decreased balance, Decreased mobility, Decreased safety awareness, Decreased strength, Dizziness, Impaired perceived functional ability, Impaired flexibility, Postural dysfunction, Impaired sensation  Visit Diagnosis: Unsteadiness on feet  Dizziness and giddiness  Other abnormalities of gait and mobility  Muscle weakness (generalized)     Problem List Patient Active Problem List   Diagnosis Date Noted  . Status post total replacement of left hip 02/02/2017  . Unilateral primary osteoarthritis, left hip 11/22/2016  . Lumbar radiculopathy 10/04/2016  . Spinal stenosis of lumbar region with neurogenic claudication 10/04/2016  . Radiculopathy due to lumbar intervertebral disc disorder 10/04/2016  . Unspecified constipation  02/04/2014  . Rhinitis, non-allergic 10/23/2013  . PRIMARY HYPERCOAGULABLE STATE 08/18/2009  . CHRONIC PULMONARY EMBOLISM 08/09/2009  . HYPERTENSION 08/04/2009  . DRY MOUTH 06/09/2008  . CARDIAC MURMUR, SYSTOLIC 72/90/2111  . URINARY FREQUENCY 06/09/2008  . DEPRESSIVE DISORDER 04/01/2008  . Obstructive sleep apnea 04/01/2008  . INSOMNIA-SLEEP DISORDER-UNSPEC 04/01/2008  . DM, UNCOMPLICATED, TYPE II 55/20/8022  . TRIGEMINAL NEURALGIA 07/08/2007  . HYPERLIPIDEMIA 07/01/2007  . GERD 07/01/2007    Cameron Sprang, PT, MPT [facility] [address] [place], [place], [address] Phone: [phone]   Fax:  [phone] 05/28/17, 4:58 PM  Name: Jeremy Stone MRN: [id number] Date of Birth: [birth year]

## 2017-05-29 DIAGNOSIS — G4733 Obstructive sleep apnea (adult) (pediatric): Secondary | ICD-10-CM | POA: Diagnosis not present

## 2017-06-01 ENCOUNTER — Ambulatory Visit: Payer: PPO

## 2017-06-04 ENCOUNTER — Ambulatory Visit: Payer: PPO | Admitting: Rehabilitation

## 2017-06-07 ENCOUNTER — Ambulatory Visit: Payer: PPO | Admitting: Rehabilitation

## 2017-06-11 ENCOUNTER — Ambulatory Visit: Payer: PPO | Admitting: Rehabilitation

## 2017-06-15 ENCOUNTER — Ambulatory Visit: Payer: PPO | Admitting: Rehabilitation

## 2017-06-18 ENCOUNTER — Ambulatory Visit: Payer: PPO | Admitting: Rehabilitation

## 2017-06-21 ENCOUNTER — Ambulatory Visit: Payer: PPO | Admitting: Rehabilitation

## 2017-06-27 ENCOUNTER — Ambulatory Visit: Payer: PPO | Admitting: Physical Therapy

## 2017-06-27 ENCOUNTER — Other Ambulatory Visit: Payer: Self-pay | Admitting: Dermatology

## 2017-06-27 DIAGNOSIS — C44329 Squamous cell carcinoma of skin of other parts of face: Secondary | ICD-10-CM | POA: Diagnosis not present

## 2017-06-29 ENCOUNTER — Ambulatory Visit: Payer: PPO | Admitting: Rehabilitation

## 2017-06-29 DIAGNOSIS — G4733 Obstructive sleep apnea (adult) (pediatric): Secondary | ICD-10-CM | POA: Diagnosis not present

## 2017-07-04 DIAGNOSIS — G4733 Obstructive sleep apnea (adult) (pediatric): Secondary | ICD-10-CM | POA: Diagnosis not present

## 2017-07-04 DIAGNOSIS — Z7984 Long term (current) use of oral hypoglycemic drugs: Secondary | ICD-10-CM | POA: Diagnosis not present

## 2017-07-04 DIAGNOSIS — Z23 Encounter for immunization: Secondary | ICD-10-CM | POA: Diagnosis not present

## 2017-07-04 DIAGNOSIS — E119 Type 2 diabetes mellitus without complications: Secondary | ICD-10-CM | POA: Diagnosis not present

## 2017-07-04 DIAGNOSIS — F329 Major depressive disorder, single episode, unspecified: Secondary | ICD-10-CM | POA: Diagnosis not present

## 2017-07-04 DIAGNOSIS — Z0000 Encounter for general adult medical examination without abnormal findings: Secondary | ICD-10-CM | POA: Diagnosis not present

## 2017-07-04 DIAGNOSIS — I10 Essential (primary) hypertension: Secondary | ICD-10-CM | POA: Diagnosis not present

## 2017-07-04 DIAGNOSIS — Z8673 Personal history of transient ischemic attack (TIA), and cerebral infarction without residual deficits: Secondary | ICD-10-CM | POA: Diagnosis not present

## 2017-07-06 ENCOUNTER — Encounter: Payer: Self-pay | Admitting: Rehabilitation

## 2017-07-06 NOTE — Therapy (Signed)
[facility] [address] [place], [place], [address] Phone: [phone]   Fax:  [phone]  Physical Therapy DC Summary  Patient Details  Name: Jeremy Stone MRN: [id number] Date of Birth: Aug 20, 1926 Referring Provider: Lavone Orn, MD  Encounter Date: 07/06/2017    Past Medical History:  Diagnosis Date  . GERD (gastroesophageal reflux disease)   . Hyperlipidemia   . Hypertension   . NIDDM (non-insulin dependent diabetes mellitus)   . OSA (obstructive sleep apnea)    uses CPAP  . Thyroid nodule   . Trigeminal neuralgia     Past Surgical History:  Procedure Laterality Date  . APPENDECTOMY    . TEE WITHOUT CARDIOVERSION N/A 05/01/2013   Procedure: TRANSESOPHAGEAL ECHOCARDIOGRAM (TEE);  Surgeon: Thayer Headings, MD;  Location: [place];  Service: Cardiovascular;  Laterality: N/A;  Need to make sure [facility] is arranged. Notifed RN at [employer] to do this on 04-30-2013  . TOTAL HIP ARTHROPLASTY Left 02/02/2017   Procedure: LEFT TOTAL HIP ARTHROPLASTY ANTERIOR APPROACH;  Surgeon: Mcarthur Rossetti, MD;  [facility];  Service: Orthopedics;  Laterality: Left;  . TRANSESOPHAGEAL ECHOCARDIOGRAM W/O CARDIOVERSION  12/06/2016  . trigeminal surgery     07-22-07- [facility]    There were no vitals filed for this visit.                                 PT Short Term Goals - 05/04/17 1329      PT SHORT TERM GOAL #1   Title Pt will be independent with initial HEP in order to indicate improved functional mobility and decreased fall risk.  (Target Date: 06/03/17)   Time 4   Period Weeks   Status New     PT SHORT TERM GOAL #2   Title Pt will improve walking speed to >/= 2.62 ft/sec w/ LRAD in order to indicate pt is safe community ambulator.     Time 4   Period Weeks   Status New     PT SHORT TERM GOAL #3   Title Pt will improve BERG score to 45/56 in order to indicate decreased fall risk.      Time 4   Period Weeks   Status New     PT SHORT TERM GOAL #4   Title Pt will ambulate x 200' w/ LRAD at S level over indoor surfaces scanning enviornment and performing turns without overt LOB to indicate safe negotiation in home.     Time 4   Period Weeks   Status New     PT SHORT TERM GOAL #5   Title Will assess 6MWT and improve distance by 35' in order to indicate improved functional endurance.     Time 4   Period Weeks   Status New           PT Long Term Goals - 05/04/17 1332      PT LONG TERM GOAL #1   Title Pt will be independent with final HEP in order to indicate improved functional mobility and decreased fall risk.  (Target Date: 07/03/17)   Time 8   Period Weeks   Status New     PT LONG TERM GOAL #2   Title Pt will improve BERG balance score to 49/56 in order to indicate decreased fall risk.     Time 8   Period Weeks   Status New  PT LONG TERM GOAL #3   Title Pt will improve 6MWT distance by 150' from baseline in order to indicate improved functional endurance.     Time 8   Period Weeks   Status New     PT LONG TERM GOAL #4   Title Pt will ambulate 500' w/ LRAD at mod I level over unlevel paved outdoor surfaces (including ramp/curb) in order to indicate safe community negotiation and return to leisure activities.     Time 8   Period Weeks   Status New             Patient will benefit from skilled therapeutic intervention in order to improve the following deficits and impairments:     Visit Diagnosis: No diagnosis found.       G-Codes - 2017-07-28 1231    Functional Assessment Tool Used (Outpatient Only) BERG: 41/56   Functional Limitation Mobility: Walking and moving around   Mobility: Walking and Moving Around Current Status [phone]) At least 20 percent but less than 40 percent impaired, limited or restricted   Mobility: Walking and Moving Around Goal Status [phone]) At least 1 percent but less than 20 percent impaired, limited or  restricted   Mobility: Walking and Moving Around Discharge Status [phone]) At least 20 percent but less than 40 percent impaired, limited or restricted       PHYSICAL THERAPY DISCHARGE SUMMARY  Visits from Start of Care: 6  Current functional level related to goals / functional outcomes: Unsure as pt did not wish to return to PT   Remaining deficits: See goals above   Education / Equipment: HEP  Plan: Patient agrees to discharge.  Patient goals were not met. Patient is being discharged due to the patient's request.  ?????           Problem List Patient Active Problem List   Diagnosis Date Noted  . Status post total replacement of left hip 02/02/2017  . Unilateral primary osteoarthritis, left hip 11/22/2016  . Lumbar radiculopathy 10/04/2016  . Spinal stenosis of lumbar region with neurogenic claudication 10/04/2016  . Radiculopathy due to lumbar intervertebral disc disorder 10/04/2016  . Unspecified constipation 02/04/2014  . Rhinitis, non-allergic 10/23/2013  . PRIMARY HYPERCOAGULABLE STATE 08/18/2009  . CHRONIC PULMONARY EMBOLISM 08/09/2009  . HYPERTENSION 08/04/2009  . DRY MOUTH 06/09/2008  . CARDIAC MURMUR, SYSTOLIC 31/09/1623  . URINARY FREQUENCY 06/09/2008  . DEPRESSIVE DISORDER 04/01/2008  . Obstructive sleep apnea 04/01/2008  . INSOMNIA-SLEEP DISORDER-UNSPEC 04/01/2008  . DM, UNCOMPLICATED, TYPE II 46/95/0722  . TRIGEMINAL NEURALGIA 07/08/2007  . HYPERLIPIDEMIA 07/01/2007  . GERD 07/01/2007    Cameron Sprang, PT, MPT [facility] [address] [place], [place], [address] Phone: [phone]   Fax:  [phone] July 28, 2017, 12:33 PM  Name: ANDRAS GRUNEWALD MRN: [id number] Date of Birth: 1926/06/14

## 2017-07-29 DIAGNOSIS — G4733 Obstructive sleep apnea (adult) (pediatric): Secondary | ICD-10-CM | POA: Diagnosis not present

## 2017-08-28 ENCOUNTER — Other Ambulatory Visit: Payer: Self-pay | Admitting: Dermatology

## 2017-08-28 DIAGNOSIS — D0439 Carcinoma in situ of skin of other parts of face: Secondary | ICD-10-CM | POA: Diagnosis not present

## 2017-08-29 DIAGNOSIS — G4733 Obstructive sleep apnea (adult) (pediatric): Secondary | ICD-10-CM | POA: Diagnosis not present

## 2017-09-03 ENCOUNTER — Other Ambulatory Visit: Payer: Self-pay

## 2017-09-03 ENCOUNTER — Encounter: Payer: Self-pay | Admitting: *Deleted

## 2017-09-03 NOTE — Telephone Encounter (Signed)
This encounter was created in error - please disregard.

## 2017-09-03 NOTE — Patient Outreach (Signed)
[facility]) Care Management  09/03/2017  Jeremy Stone [birth year] [id number]   TELEPHONE SCREENING: Referral Date: 08/31/2017 Referral Source: Primary Care Physician referral Referral Reason: Recent CVA, Cognitive/Physical decline and Depression   Outreach Attempt: 1st attempt   Social: The patient lives in the home with his wife. His wife helps take care of him. He states that he is compliant with his medications.  He is able to perform his ADLS' with assistance. He no longer drives due to a recent stroke. He rates his health as good. The patient takes metformin.  He does not check his blood sugars but they are checked at his doctor's visits. The patient states that since his recent stroke he has some memory issues.  The patient walks with a cane and walker and denies any falls within the last year. He states that he has some depression related to his age and inability to do things as he had before. He verbalized that he would like some help. PHQ2 - 2 PHQ9 - 6  Conditions: Recent CVA, Depression, Diabetes type 2  and Hypertension   Medications: Norvasc, Metformin, Meclizine   Appointments: Last appointment with his primary care was 07/04/2017.  No future appointments noted.   Consent: HIPAA verified.   Plan: RN Health Coach will send [facility] SW referral for possible assistance with resources related to depression and in home support to alleviate caregiver burnout/fatigue.  Lazaro Arms RN, BSN, [place] Direct Dial:  [phone] Fax: [phone]

## 2017-09-04 ENCOUNTER — Other Ambulatory Visit: Payer: Self-pay | Admitting: *Deleted

## 2017-09-04 NOTE — Patient Outreach (Signed)
[facility] Care Management  09/04/2017  TYRIN HERBERS [birth year] [id number]   CSW made an initial attempt to try and contact patient today to perform phone assessment, as well as assess and assist with social needs and services, without success.  A HIPAA compliant message was left for patient on voicemail.  CSW is currently awaiting a return call. CSW will make a second outreach attempt within the next week, if CSW does not receive a return call from patient in the meantime. Nat Christen, BSW, MSW, LCSW  Licensed Education officer, environmental Health System  Mailing [place] [address] [place], [place] [address] Physical Address-[address], [place], [place] [address] Toll Free Main # [phone] Fax # [phone] Cell # [phone]  Office # [phone] [email or web address]

## 2017-09-06 ENCOUNTER — Ambulatory Visit: Payer: Self-pay | Admitting: *Deleted

## 2017-09-07 ENCOUNTER — Other Ambulatory Visit: Payer: Self-pay | Admitting: *Deleted

## 2017-09-07 NOTE — Patient Outreach (Signed)
[facility]) Care Management  09/07/2017  SIRE POET [birth year] [id number]   CSW made a second attempt to try and contact patient today to perform phone assessment, as well as assess and assist with social needs and services, without success.  A HIPAA compliant message was left for patient on voicemail.  CSW is currently awaiting a return call. CSW will make a third and final outreach attempt within the next week, if CSW does not receive a return call from patient in the meantime. Nat Christen, BSW, MSW, LCSW  Licensed Education officer, environmental Health System  Mailing [place] [address], [place], [place] [address] Physical Address[address], [place], [place] [address] Toll Free Main # [phone] Fax # [phone] Cell # [phone]  Office # [phone] [email or web address]

## 2017-09-12 ENCOUNTER — Other Ambulatory Visit: Payer: Self-pay | Admitting: *Deleted

## 2017-09-12 ENCOUNTER — Encounter: Payer: Self-pay | Admitting: *Deleted

## 2017-09-12 NOTE — Patient Outreach (Signed)
[facility]) Care Management  09/12/2017  Jeremy Stone 1926/08/20 [id number]   CSW made a third and final attempt to try and contact patient today to perform phone assessment, as well as assess and assist with social work needs and services, without success.  A HIPAA compliant message was left for patient on voicemail.  CSW  continues to await a return call.  CSW will mail an outreach letter to patient's home, encouraging patient to contact CSW at their earliest convenience, if patient is interested in receiving social work services through [place] with [facility].  If CSW does not receive a return call from patient within the next 10 business days, CSW will proceed with case closure.  Required number of phone attempts will have been made and outreach letter mailed.   Nat Christen, BSW, MSW, LCSW  Licensed Education officer, environmental Health System  Mailing [place] [address], [place], [place] [address] Physical Address[address], [place], [place] [address] Toll Free Main # [phone] Fax # [phone] Cell # [phone]  Office # [phone] [email or web address]

## 2017-09-26 ENCOUNTER — Other Ambulatory Visit: Payer: Self-pay | Admitting: *Deleted

## 2017-09-26 ENCOUNTER — Encounter: Payer: Self-pay | Admitting: *Deleted

## 2017-09-26 NOTE — Patient Outreach (Signed)
[facility]) Care Management  09/26/2017  Jeremy Stone 11/27/1925 [id number]   CSW will perform a case closure on patient, due to inability to establish initial phone contact, despite required number of phone attempts made and outreach letter mailed to patient's home allowing for 10 business days.  CSW will notify patient's RNCM with [facility], of CSW's plans to close patient's case.  CSW will fax an update to patient's Primary Care Physician, Dr. Lavone Orn to ensure that they are aware of CSW's involvement with patient's plan of care.  CSW will submit a case closure request to Alycia Rossetti, Care Management Assistant with [facility], in the form of an [employer].   Nat Christen, BSW, MSW, LCSW  Licensed Education officer, environmental Health System  Mailing [place] [address] [place], [place] [address] Physical Address[address], [place], [place] [address] Toll Free Main # [phone] Fax # [phone] Cell # [phone]  Office # [phone] [email or web address]

## 2017-09-27 DIAGNOSIS — L89322 Pressure ulcer of left buttock, stage 2: Secondary | ICD-10-CM | POA: Diagnosis not present

## 2017-09-28 DIAGNOSIS — G4733 Obstructive sleep apnea (adult) (pediatric): Secondary | ICD-10-CM | POA: Diagnosis not present

## 2017-10-09 DIAGNOSIS — R350 Frequency of micturition: Secondary | ICD-10-CM | POA: Diagnosis not present

## 2017-10-09 DIAGNOSIS — F329 Major depressive disorder, single episode, unspecified: Secondary | ICD-10-CM | POA: Diagnosis not present

## 2017-10-29 DIAGNOSIS — G4733 Obstructive sleep apnea (adult) (pediatric): Secondary | ICD-10-CM | POA: Diagnosis not present

## 2017-11-07 DIAGNOSIS — G4733 Obstructive sleep apnea (adult) (pediatric): Secondary | ICD-10-CM | POA: Diagnosis not present

## 2017-11-07 DIAGNOSIS — F329 Major depressive disorder, single episode, unspecified: Secondary | ICD-10-CM | POA: Diagnosis not present

## 2017-11-19 ENCOUNTER — Other Ambulatory Visit: Payer: Self-pay | Admitting: Internal Medicine

## 2017-11-19 DIAGNOSIS — R1310 Dysphagia, unspecified: Secondary | ICD-10-CM | POA: Diagnosis not present

## 2017-11-23 ENCOUNTER — Ambulatory Visit
Admission: RE | Admit: 2017-11-23 | Discharge: 2017-11-23 | Disposition: A | Discharge status: Home / Self Care | Payer: PPO | Source: Ambulatory Visit | Attending: Internal Medicine | Admitting: Internal Medicine

## 2017-11-23 DIAGNOSIS — R1310 Dysphagia, unspecified: Secondary | ICD-10-CM

## 2017-11-23 DIAGNOSIS — K449 Diaphragmatic hernia without obstruction or gangrene: Secondary | ICD-10-CM | POA: Diagnosis not present

## 2017-11-27 DIAGNOSIS — L570 Actinic keratosis: Secondary | ICD-10-CM | POA: Diagnosis not present

## 2017-11-27 DIAGNOSIS — L309 Dermatitis, unspecified: Secondary | ICD-10-CM | POA: Diagnosis not present

## 2017-11-29 DIAGNOSIS — G4733 Obstructive sleep apnea (adult) (pediatric): Secondary | ICD-10-CM | POA: Diagnosis not present

## 2018-01-03 DIAGNOSIS — K225 Diverticulum of esophagus, acquired: Secondary | ICD-10-CM | POA: Diagnosis not present

## 2018-01-03 DIAGNOSIS — I872 Venous insufficiency (chronic) (peripheral): Secondary | ICD-10-CM | POA: Diagnosis not present

## 2018-01-03 DIAGNOSIS — E119 Type 2 diabetes mellitus without complications: Secondary | ICD-10-CM | POA: Diagnosis not present

## 2018-01-03 DIAGNOSIS — I10 Essential (primary) hypertension: Secondary | ICD-10-CM | POA: Diagnosis not present

## 2018-02-13 ENCOUNTER — Ambulatory Visit (INDEPENDENT_AMBULATORY_CARE_PROVIDER_SITE_OTHER): Payer: PPO | Admitting: Orthopaedic Surgery

## 2018-02-26 ENCOUNTER — Observation Stay (HOSPITAL_COMMUNITY)
Admission: EM | Admit: 2018-02-26 | Discharge: 2018-02-27 | Discharge status: Against Medical Advice | Payer: PPO | Attending: Family Medicine | Admitting: Family Medicine

## 2018-02-26 ENCOUNTER — Encounter (HOSPITAL_COMMUNITY): Payer: Self-pay | Admitting: Emergency Medicine

## 2018-02-26 ENCOUNTER — Observation Stay (HOSPITAL_COMMUNITY): Payer: PPO

## 2018-02-26 ENCOUNTER — Other Ambulatory Visit: Payer: Self-pay

## 2018-02-26 DIAGNOSIS — K219 Gastro-esophageal reflux disease without esophagitis: Secondary | ICD-10-CM | POA: Insufficient documentation

## 2018-02-26 DIAGNOSIS — G3189 Other specified degenerative diseases of nervous system: Secondary | ICD-10-CM | POA: Insufficient documentation

## 2018-02-26 DIAGNOSIS — R404 Transient alteration of awareness: Secondary | ICD-10-CM | POA: Diagnosis not present

## 2018-02-26 DIAGNOSIS — I10 Essential (primary) hypertension: Secondary | ICD-10-CM

## 2018-02-26 DIAGNOSIS — Z5321 Procedure and treatment not carried out due to patient leaving prior to being seen by health care provider: Secondary | ICD-10-CM | POA: Diagnosis not present

## 2018-02-26 DIAGNOSIS — Z7982 Long term (current) use of aspirin: Secondary | ICD-10-CM | POA: Diagnosis not present

## 2018-02-26 DIAGNOSIS — Z87891 Personal history of nicotine dependence: Secondary | ICD-10-CM | POA: Diagnosis not present

## 2018-02-26 DIAGNOSIS — M19012 Primary osteoarthritis, left shoulder: Secondary | ICD-10-CM | POA: Insufficient documentation

## 2018-02-26 DIAGNOSIS — M4802 Spinal stenosis, cervical region: Secondary | ICD-10-CM | POA: Insufficient documentation

## 2018-02-26 DIAGNOSIS — N182 Chronic kidney disease, stage 2 (mild): Secondary | ICD-10-CM | POA: Diagnosis not present

## 2018-02-26 DIAGNOSIS — R918 Other nonspecific abnormal finding of lung field: Secondary | ICD-10-CM | POA: Diagnosis not present

## 2018-02-26 DIAGNOSIS — D6859 Other primary thrombophilia: Secondary | ICD-10-CM | POA: Diagnosis present

## 2018-02-26 DIAGNOSIS — I350 Nonrheumatic aortic (valve) stenosis: Secondary | ICD-10-CM | POA: Diagnosis not present

## 2018-02-26 DIAGNOSIS — Z96642 Presence of left artificial hip joint: Secondary | ICD-10-CM | POA: Insufficient documentation

## 2018-02-26 DIAGNOSIS — M19011 Primary osteoarthritis, right shoulder: Secondary | ICD-10-CM | POA: Insufficient documentation

## 2018-02-26 DIAGNOSIS — E739 Lactose intolerance, unspecified: Secondary | ICD-10-CM | POA: Insufficient documentation

## 2018-02-26 DIAGNOSIS — G500 Trigeminal neuralgia: Secondary | ICD-10-CM | POA: Insufficient documentation

## 2018-02-26 DIAGNOSIS — R011 Cardiac murmur, unspecified: Secondary | ICD-10-CM | POA: Diagnosis present

## 2018-02-26 DIAGNOSIS — Z8673 Personal history of transient ischemic attack (TIA), and cerebral infarction without residual deficits: Secondary | ICD-10-CM | POA: Insufficient documentation

## 2018-02-26 DIAGNOSIS — Z882 Allergy status to sulfonamides status: Secondary | ICD-10-CM | POA: Insufficient documentation

## 2018-02-26 DIAGNOSIS — E785 Hyperlipidemia, unspecified: Secondary | ICD-10-CM | POA: Diagnosis not present

## 2018-02-26 DIAGNOSIS — R112 Nausea with vomiting, unspecified: Secondary | ICD-10-CM | POA: Diagnosis not present

## 2018-02-26 DIAGNOSIS — I129 Hypertensive chronic kidney disease with stage 1 through stage 4 chronic kidney disease, or unspecified chronic kidney disease: Secondary | ICD-10-CM | POA: Insufficient documentation

## 2018-02-26 DIAGNOSIS — Z7984 Long term (current) use of oral hypoglycemic drugs: Secondary | ICD-10-CM | POA: Insufficient documentation

## 2018-02-26 DIAGNOSIS — E1122 Type 2 diabetes mellitus with diabetic chronic kidney disease: Secondary | ICD-10-CM | POA: Insufficient documentation

## 2018-02-26 DIAGNOSIS — G4733 Obstructive sleep apnea (adult) (pediatric): Secondary | ICD-10-CM | POA: Insufficient documentation

## 2018-02-26 DIAGNOSIS — Z9989 Dependence on other enabling machines and devices: Secondary | ICD-10-CM | POA: Diagnosis not present

## 2018-02-26 DIAGNOSIS — Z9119 Patient's noncompliance with other medical treatment and regimen: Secondary | ICD-10-CM | POA: Insufficient documentation

## 2018-02-26 DIAGNOSIS — E119 Type 2 diabetes mellitus without complications: Secondary | ICD-10-CM | POA: Diagnosis not present

## 2018-02-26 DIAGNOSIS — R600 Localized edema: Secondary | ICD-10-CM | POA: Insufficient documentation

## 2018-02-26 DIAGNOSIS — Z79899 Other long term (current) drug therapy: Secondary | ICD-10-CM | POA: Insufficient documentation

## 2018-02-26 DIAGNOSIS — R55 Syncope and collapse: Secondary | ICD-10-CM | POA: Diagnosis not present

## 2018-02-26 LAB — BASIC METABOLIC PANEL
Anion gap: 10 (ref 5–15)
BUN: 25 mg/dL — AB (ref 6–20)
CO2: 21 mmol/L — ABNORMAL LOW (ref 22–32)
CREATININE: 1.37 mg/dL — AB (ref 0.61–1.24)
Calcium: 8.9 mg/dL (ref 8.9–10.3)
Chloride: 109 mmol/L (ref 101–111)
GFR, EST AFRICAN AMERICAN: 51 mL/min — AB (ref >60)
GFR, EST NON AFRICAN AMERICAN: 44 mL/min — AB (ref >60)
Glucose, Bld: 185 mg/dL — ABNORMAL HIGH (ref 65–99)
Potassium: 4.2 mmol/L (ref 3.5–5.1)
Sodium: 140 mmol/L (ref 135–145)

## 2018-02-26 LAB — MAGNESIUM: Magnesium: 1.9 mg/dL (ref 1.7–2.4)

## 2018-02-26 LAB — CREATININE, SERUM
Creatinine, Ser: 1.33 mg/dL — ABNORMAL HIGH (ref 0.61–1.24)
GFR calc non Af Amer: 45 mL/min — ABNORMAL LOW (ref >60)
GFR, EST AFRICAN AMERICAN: 53 mL/min — AB (ref >60)

## 2018-02-26 LAB — CBC
HCT: 37.6 % — ABNORMAL LOW (ref 39.0–52.0)
HEMATOCRIT: 39.5 % (ref 39.0–52.0)
Hemoglobin: 12.7 g/dL — ABNORMAL LOW (ref 13.0–17.0)
Hemoglobin: 13.4 g/dL (ref 13.0–17.0)
MCH: 32.3 pg (ref 26.0–34.0)
MCH: 32.3 pg (ref 26.0–34.0)
MCHC: 33.8 g/dL (ref 30.0–36.0)
MCHC: 33.9 g/dL (ref 30.0–36.0)
MCV: 95.7 fL (ref 78.0–100.0)
MCV: 95.2 fL (ref 78.0–100.0)
PLATELETS: 232 10*3/uL (ref 150–400)
PLATELETS: 244 10*3/uL (ref 150–400)
RBC: 3.93 MIL/uL — ABNORMAL LOW (ref 4.22–5.81)
RBC: 4.15 MIL/uL — ABNORMAL LOW (ref 4.22–5.81)
RDW: 13.8 % (ref 11.5–15.5)
RDW: 13.8 % (ref 11.5–15.5)
WBC: 10.3 10*3/uL (ref 4.0–10.5)
WBC: 9.7 10*3/uL (ref 4.0–10.5)

## 2018-02-26 LAB — HEPATIC FUNCTION PANEL
ALBUMIN: 3.7 g/dL (ref 3.5–5.0)
ALK PHOS: 60 U/L (ref 38–126)
ALT: 12 U/L — AB (ref 17–63)
AST: 16 U/L (ref 15–41)
Bilirubin, Direct: 0.1 mg/dL (ref 0.1–0.5)
Indirect Bilirubin: 0.6 mg/dL (ref 0.3–0.9)
TOTAL PROTEIN: 6.8 g/dL (ref 6.5–8.1)
Total Bilirubin: 0.7 mg/dL (ref 0.3–1.2)

## 2018-02-26 LAB — CBG MONITORING, ED: GLUCOSE-CAPILLARY: 172 mg/dL — AB (ref 65–99)

## 2018-02-26 LAB — TSH: TSH: 2.532 u[IU]/mL (ref 0.350–4.500)

## 2018-02-26 LAB — D-DIMER, QUANTITATIVE: D-Dimer, Quant: 1.24 ug/mL-FEU — ABNORMAL HIGH (ref 0.00–0.50)

## 2018-02-26 LAB — TROPONIN I

## 2018-02-26 MED ORDER — SODIUM CHLORIDE 0.9 % IV SOLN
1.0000 g | INTRAVENOUS | Status: DC
  Administered 2018-02-27: 1 g via INTRAVENOUS
  Filled 2018-02-26 (×2): qty 10

## 2018-02-26 MED ORDER — ONDANSETRON HCL 4 MG/2ML IJ SOLN
4.0000 mg | Freq: Once | INTRAMUSCULAR | Status: AC
  Administered 2018-02-26: 4 mg via INTRAVENOUS
  Filled 2018-02-26: qty 2

## 2018-02-26 MED ORDER — ACETAMINOPHEN 650 MG RE SUPP: 650.0000 mg | Freq: Four times a day (QID) | RECTAL | Status: DC | PRN

## 2018-02-26 MED ORDER — ONDANSETRON HCL 4 MG PO TABS: 4.0000 mg | ORAL_TABLET | Freq: Four times a day (QID) | ORAL | Status: DC | PRN

## 2018-02-26 MED ORDER — HYDRALAZINE HCL 20 MG/ML IJ SOLN
10.0000 mg | INTRAMUSCULAR | Status: AC
  Administered 2018-02-26: 10 mg via INTRAVENOUS
  Filled 2018-02-26: qty 1

## 2018-02-26 MED ORDER — SODIUM CHLORIDE 0.9 % IV SOLN
500.0000 mg | INTRAVENOUS | Status: DC
  Administered 2018-02-26: 500 mg via INTRAVENOUS
  Filled 2018-02-26 (×2): qty 500

## 2018-02-26 MED ORDER — ENOXAPARIN SODIUM 30 MG/0.3ML ~~LOC~~ SOLN
30.0000 mg | SUBCUTANEOUS | Status: DC
  Administered 2018-02-26: 30 mg via SUBCUTANEOUS
  Filled 2018-02-26: qty 0.3

## 2018-02-26 MED ORDER — HYDRALAZINE HCL 20 MG/ML IJ SOLN: 10.0000 mg | INTRAMUSCULAR | Status: DC | PRN

## 2018-02-26 MED ORDER — ACETAMINOPHEN 325 MG PO TABS: 650.0000 mg | ORAL_TABLET | Freq: Four times a day (QID) | ORAL | Status: DC | PRN

## 2018-02-26 MED ORDER — ONDANSETRON HCL 4 MG/2ML IJ SOLN: 4.0000 mg | Freq: Four times a day (QID) | INTRAMUSCULAR | Status: DC | PRN

## 2018-02-26 MED ORDER — VITAMIN B-12 1000 MCG PO TABS
1000.0000 ug | ORAL_TABLET | Freq: Every day | ORAL | Status: DC
  Administered 2018-02-26 – 2018-02-27 (×2): 1000 ug via ORAL
  Filled 2018-02-26 (×3): qty 1

## 2018-02-26 MED ORDER — SODIUM CHLORIDE 0.9 % IV SOLN
INTRAVENOUS | Status: DC
Start: 2018-02-26 — End: 2018-02-27
  Administered 2018-02-27: 01:00:00 via INTRAVENOUS

## 2018-02-26 MED ORDER — INSULIN ASPART 100 UNIT/ML ~~LOC~~ SOLN: 0.0000 [IU] | Freq: Three times a day (TID) | SUBCUTANEOUS | Status: DC

## 2018-02-26 NOTE — ED Notes (Signed)
Stopped pts azithromycin infusion. Pt complaining of burning and stinging pain at IV site. On coming nurse notified.

## 2018-02-26 NOTE — ED Notes (Signed)
Phlebotomy collected all labs.

## 2018-02-26 NOTE — ED Provider Notes (Signed)
[facility] EMERGENCY DEPARTMENT Provider Note   CSN: [id number] Arrival date & time: 02/26/18  1658     History   Chief Complaint Chief Complaint  Patient presents with  . Near Syncope  . Emesis  . Weakness    HPI MICHAE GRIMLEY is a 84 y.o. male.  HPI  Vision is a 84 year old male, he has a known history of obstructive sleep apnea, DM, high blood pressure, hyperlipidemia and a history of trigeminal neuralgia.  He presents to the hospital today with a complaint of what appears to be some type of syncopal event.  It is not exactly clear what happened but per the patient and his spouse who is an additional historian he did not sleep well the last several nights as they are in the midst of a move, he has not been using his CPAP machine and thus has been becoming intermittently apneic at night and confused in the morning which usually improves with time however this morning the water in their new house was not yet turned on so they went to their old house to shower, while they were in the house he became extremely diffusely weak holding on that the wall to prevent collapsing, his wife had to essentially carry him to his desk chair.  After several minutes of him being minimally responsive she had to leave to go pick up her grandchildren, she turned around and decided to come back and he was still in that same position between 10 and 15 minutes later.  He then became more responsive.  Paramedics denied any abnormal vital signs and his mental status was in a normal range on their evaluation.  The patient denies any pain and specifically any chest pain shortness of breath or palpitations.  He does have chronic lower extremity edema.  At this time the patient has normal mental status.  There is no seizure activity with this.  The patient denies any prior cardiac or arrhythmic history  Past Medical History:  Diagnosis Date  . GERD (gastroesophageal reflux disease)   .  Hyperlipidemia   . Hypertension   . NIDDM (non-insulin dependent diabetes mellitus)   . OSA (obstructive sleep apnea)    uses CPAP  . Thyroid nodule   . Trigeminal neuralgia     Patient Active Problem List   Diagnosis Date Noted  . Syncope 02/26/2018  . Status post total replacement of left hip 02/02/2017  . Unilateral primary osteoarthritis, left hip 11/22/2016  . Lumbar radiculopathy 10/04/2016  . Spinal stenosis of lumbar region with neurogenic claudication 10/04/2016  . Radiculopathy due to lumbar intervertebral disc disorder 10/04/2016  . Unspecified constipation 02/04/2014  . Rhinitis, non-allergic 10/23/2013  . PRIMARY HYPERCOAGULABLE STATE 08/18/2009  . CHRONIC PULMONARY EMBOLISM 08/09/2009  . HYPERTENSION 08/04/2009  . DRY MOUTH 06/09/2008  . CARDIAC MURMUR, SYSTOLIC 12/87/8676  . URINARY FREQUENCY 06/09/2008  . DEPRESSIVE DISORDER 04/01/2008  . Obstructive sleep apnea 04/01/2008  . INSOMNIA-SLEEP DISORDER-UNSPEC 04/01/2008  . DM, UNCOMPLICATED, TYPE II 72/06/4708  . TRIGEMINAL NEURALGIA 07/08/2007  . HYPERLIPIDEMIA 07/01/2007  . GERD 07/01/2007    Past Surgical History:  Procedure Laterality Date  . APPENDECTOMY    . TEE WITHOUT CARDIOVERSION N/A 05/01/2013   Procedure: TRANSESOPHAGEAL ECHOCARDIOGRAM (TEE);  Surgeon: Thayer Headings, MD;  Location: [facility];  Service: Cardiovascular;  Laterality: N/A;  Need to make sure [facility] is arranged. Notifed RN at [employer] to do this on 04-30-2013  . TOTAL HIP ARTHROPLASTY Left 02/02/2017  Procedure: LEFT TOTAL HIP ARTHROPLASTY ANTERIOR APPROACH;  Surgeon: Mcarthur Rossetti, MD;  [facility];  Service: Orthopedics;  Laterality: Left;  . TRANSESOPHAGEAL ECHOCARDIOGRAM W/O CARDIOVERSION  12/06/2016  . trigeminal surgery     07-22-07- [facility]        Home Medications    Prior to Admission medications   Medication Sig Start Date End Date Taking? Authorizing Provider  b complex vitamins tablet Take 1 tablet by mouth  daily.   Yes [provider]  Cholecalciferol (VITAMIN D3) 50000 units TABS Take 5,000 Units by mouth daily.   Yes [provider]  Coenzyme Q10 (CO Q-10) 100 MG CAPS Take by mouth daily.   Yes [provider]  Cyanocobalamin (VITAMIN B-12) 1000 MCG SUBL Place 1,000 mcg under the tongue daily.   Yes [provider]  meclizine (ANTIVERT) 25 MG tablet Take 25 mg by mouth 3 (three) times daily as needed for dizziness.   Yes [provider]  metFORMIN (GLUCOPHAGE) 500 MG tablet Take 1 tablet (500 mg total) by mouth 2 (two) times daily with a meal. Patient taking differently: Take 500 mg by mouth daily.  11/03/13  Yes Norins, Heinz Knuckles, MD  PRESCRIPTION MEDICATION CPAP   Yes [provider]  PRESCRIPTION MEDICATION Apply 1 application topically as needed. Pain ointment   Yes [provider]  Probiotic Product (PROBIOTIC PO) Take 1 tablet by mouth daily.   Yes [provider]  amLODipine (NORVASC) 5 MG tablet Take 1 tablet (5 mg total) by mouth daily. Patient not taking: Reported on 02/26/2018 11/03/13   Neena Rhymes, MD  aspirin 81 MG chewable tablet Chew 1 tablet (81 mg total) by mouth 2 (two) times daily. Patient not taking: Reported on 02/26/2018 02/03/17   Pete Pelt, PA-C  fluticasone [facility]) 50 MCG/ACT nasal spray Place 2 sprays into both nostrils daily. Patient not taking: Reported on 05/04/2017 10/21/13   Neena Rhymes, MD  traMADol (ULTRAM) 50 MG tablet Take 1 tablet (50 mg total) by mouth every 6 (six) hours as needed for moderate pain. Patient not taking: Reported on 05/04/2017 09/21/16   Gerda Diss, DO    Family History Family History  Problem Relation Age of Onset  . Dementia Sister     Social History Social History   Tobacco Use  . Smoking status: Former Smoker    Packs/day: 1.00    Years: 20.00    Pack years: 20.00    Types: Cigarettes    Last attempt to quit: 10/23/1982    Years since  quitting: 35.3  . Smokeless tobacco: Never Used  Substance Use Topics  . Alcohol use: Yes    Alcohol/week: 5.0 oz    Types: 10 Standard drinks or equivalent per week    Comment: wine with dinner  . Drug use: No     Allergies   Lactose intolerance (gi) and Sulfa antibiotics   Review of Systems Review of Systems  All other systems reviewed and are negative.    Physical Exam Updated Vital Signs BP (!) 154/77   Pulse 77   Resp (!) 23   SpO2 98%   Physical Exam  Constitutional: He appears well-developed and well-nourished. No distress.  HENT:  Head: Normocephalic and atraumatic.  Mouth/Throat: Oropharynx is clear and moist. No oropharyngeal exudate.  Eyes: Pupils are equal, round, and reactive to light. Conjunctivae and EOM are normal. Right eye exhibits no discharge. Left eye exhibits no discharge. No scleral icterus.  Neck: Normal range of motion. Neck supple. No JVD present. No thyromegaly present.  Cardiovascular: Normal rate, regular rhythm and intact distal pulses. Exam reveals no gallop and no friction rub.  Murmur ( systolic murmur) heard. Pulmonary/Chest: Effort normal and breath sounds normal. No respiratory distress. He has no wheezes. He has no rales.  Abdominal: Soft. Bowel sounds are normal. He exhibits no distension and no mass. There is no tenderness.  Musculoskeletal: Normal range of motion. He exhibits edema (mild bilateral LE edema). He exhibits no tenderness.  Lymphadenopathy:    He has no cervical adenopathy.  Neurological: He is alert. Coordination normal.  Neurologic exam:  Speech clear, pupils equal round reactive to light, extraocular movements intact  Normal peripheral visual fields Cranial nerves III through XII normal including no facial droop Follows commands, moves all extremities x4, normal strength to bilateral upper and lower extremities at all major muscle groups including grip Sensation normal to light touch and pinprick Coordination  intact, no limb ataxia, finger-nose-finger normal Rapid alternating movements normal No pronator drift   Skin: Skin is warm and dry. No rash noted. No erythema.  Psychiatric: He has a normal mood and affect. His behavior is normal.  Nursing note and vitals reviewed.    ED Treatments / Results  Labs (all labs ordered are listed, but only abnormal results are displayed) Labs Reviewed  BASIC METABOLIC PANEL - Abnormal; Notable for the following components:      Result Value   CO2 21 (*)    Glucose, Bld 185 (*)    BUN 25 (*)    Creatinine, Ser 1.37 (*)    GFR calc non Af Amer 44 (*)    GFR calc Af Amer 51 (*)    All other components within normal limits  CBC - Abnormal; Notable for the following components:   RBC 3.93 (*)    Hemoglobin 12.7 (*)    HCT 37.6 (*)    All other components within normal limits  CBG MONITORING, ED - Abnormal; Notable for the following components:   Glucose-Capillary 172 (*)    All other components within normal limits  URINE CULTURE  TROPONIN I  URINALYSIS, ROUTINE W REFLEX MICROSCOPIC  RAPID URINE DRUG SCREEN, HOSP PERFORMED    EKG EKG Interpretation  Date/Time:  Tuesday Feb 26 2018 17:16:07 EDT Ventricular Rate:  75 PR Interval:    QRS Duration: 96 QT Interval:  424 QTC Calculation: 474 R Axis:   38 Text Interpretation:  Sinus rhythm subtle ST abnormalities no QRS abnormalities. since 01/26/17, now with subtle changes as above Abnormal ekg Confirmed by Noemi Chapel [phone]) on 02/26/2018 5:23:37 PM   Radiology No results found.  Procedures Procedures (including critical care time)  Medications Ordered in ED Medications  hydrALAZINE (APRESOLINE) injection 10 mg (has no administration in time range)  ondansetron (ZOFRAN) injection 4 mg (has no administration in time range)     Initial Impression / Assessment and Plan / ED Course  I have reviewed the triage vital signs and the nursing notes.  Pertinent labs & imaging results that  were available during my care of the patient were reviewed by me and considered in my medical decision making (see chart for details).     The patient's exam is non-concerning however the history is extremely concerning and with a prolonged period where his wife states that he was ashen, cyanotic, essentially unresponsive it raises significant concern for arrhythmia or other significant event.  His neurologic exam is normal and  he has no headache at this time making a intracranial lesion much less likely however given the prolonged time that he was down we will perform a CT scan of the brain as well.  Anticipate the need for admission of the hospital for at the minimum cardiac monitoring.  His EKG here is unremarkable showing no signs of arrhythmia or ischemia other than very subtle ST depression in the inferior leads.  Initial w/u with CBC and chemistry and troponin is negative, he has no chets pain and no palpitaitons at this time - no SOB.    D/w Dr. Hal Hope - will admit Labs reassuring but BP is 200/100 Hydralazine given  Vitals:   02/26/18 1730 02/26/18 1800  BP: (!) 151/71 (!) 154/77  Pulse: 73 77  Resp: (!) 23 (!) 23  SpO2: 95% 98%    Paged hospitalist for admission at 8:09 PM  Final Clinical Impressions(s) / ED Diagnoses   Final diagnoses:  Essential hypertension  Syncope and collapse    ED Discharge Orders    None       Noemi Chapel, MD 02/26/18 2027

## 2018-02-26 NOTE — ED Notes (Signed)
MD K at bedside 

## 2018-02-26 NOTE — ED Notes (Signed)
Phlebotomy at bedside.

## 2018-02-26 NOTE — H&P (Addendum)
History and Physical    Jeremy Stone NWG:[id number] DOB: 1926/04/02 DOA: 02/26/2018  PCP: Lavone Orn, MD  Patient coming from: Home.  History obtained from patient and patient's wife.  Chief Complaint: Loss of consciousness.  HPI: Jeremy Stone is a 84 y.o. male with history of diabetes mellitus type 2, sleep apnea, hypertension, trigeminal neuralgia, was brought to the ER for further evaluation of possible syncope.  Patient was moving to a new house.  In the process today around 2 PM patient at his old house became mildly dizzy and weak and had to sit on his chair.  Following which patient wife states that he has been losing consciousness in and out.  Patient did not have any incontinence of urine or any seizure-like activities.  Patient does not recall the episode.  The whole incident may have lasted for around 10 minutes.  Patient was brought to his PCPs office and was brought to ER.  He denies any chest pain shortness of breath.  Had nausea and vomiting couple episode.  Denies abdominal pain or diarrhea.  Patient wife states that recently patient has not been using his CPAP machine as advised.    ED Course: In the ER patient appears nonfocal.  Denied any chest pain shortness of breath.  Abdomen appears benign.  CT of the head was unremarkable.  Chest x-ray shows possible infiltrates.  Troponin was negative.  EKG shows normal sinus rhythm.  Is within acceptable limits.  Patient admitted for further observation and management of syncope.  Review of Systems: As per HPI, rest all negative.   Past Medical History:  Diagnosis Date  . GERD (gastroesophageal reflux disease)   . Hyperlipidemia   . Hypertension   . NIDDM (non-insulin dependent diabetes mellitus)   . OSA (obstructive sleep apnea)    uses CPAP  . Thyroid nodule   . Trigeminal neuralgia     Past Surgical History:  Procedure Laterality Date  . APPENDECTOMY    . TEE WITHOUT CARDIOVERSION N/A 05/01/2013   Procedure: TRANSESOPHAGEAL ECHOCARDIOGRAM (TEE);  Surgeon: Thayer Headings, MD;  Location: [facility];  Service: Cardiovascular;  Laterality: N/A;  Need to make sure [facility] is arranged. Notifed RN at [employer] to do this on 04-30-2013  . TOTAL HIP ARTHROPLASTY Left 02/02/2017   Procedure: LEFT TOTAL HIP ARTHROPLASTY ANTERIOR APPROACH;  Surgeon: Mcarthur Rossetti, MD;  Location: [facility];  Service: Orthopedics;  Laterality: Left;  . TRANSESOPHAGEAL ECHOCARDIOGRAM W/O CARDIOVERSION  12/06/2016  . trigeminal surgery     07-22-07- [facility]     reports that he quit smoking about 35 years ago. His smoking use included cigarettes. He has a 20.00 pack-year smoking history. He has never used smokeless tobacco. He reports that he drinks about 5.0 oz of alcohol per week. He reports that he does not use drugs.  Allergies  Allergen Reactions  . Lactose Intolerance (Gi)   . Sulfa Antibiotics Other (See Comments)    dizziness    Family History  Problem Relation Age of Onset  . Dementia Sister     Prior to Admission medications   Medication Sig Start Date End Date Taking? Authorizing Provider  b complex vitamins tablet Take 1 tablet by mouth daily.   Yes [provider]  Cholecalciferol (VITAMIN D3) 50000 units TABS Take 5,000 Units by mouth daily.   Yes [provider]  Coenzyme Q10 (CO Q-10) 100 MG CAPS Take by mouth daily.   Yes [provider]  Cyanocobalamin (VITAMIN  B-12) 1000 MCG SUBL Place 1,000 mcg under the tongue daily.   Yes [provider]  meclizine (ANTIVERT) 25 MG tablet Take 25 mg by mouth 3 (three) times daily as needed for dizziness.   Yes [provider]  metFORMIN (GLUCOPHAGE) 500 MG tablet Take 1 tablet (500 mg total) by mouth 2 (two) times daily with a meal. Patient taking differently: Take 500 mg by mouth daily.  11/03/13  Yes Norins, Heinz Knuckles, MD  PRESCRIPTION MEDICATION CPAP   Yes [provider]  PRESCRIPTION MEDICATION Apply 1  application topically as needed. Pain ointment   Yes [provider]  Probiotic Product (PROBIOTIC PO) Take 1 tablet by mouth daily.   Yes [provider]  amLODipine (NORVASC) 5 MG tablet Take 1 tablet (5 mg total) by mouth daily. Patient not taking: Reported on 02/26/2018 11/03/13   Neena Rhymes, MD  aspirin 81 MG chewable tablet Chew 1 tablet (81 mg total) by mouth 2 (two) times daily. Patient not taking: Reported on 02/26/2018 02/03/17   Pete Pelt, PA-C  fluticasone [facility]) 50 MCG/ACT nasal spray Place 2 sprays into both nostrils daily. Patient not taking: Reported on 05/04/2017 10/21/13   Neena Rhymes, MD  traMADol (ULTRAM) 50 MG tablet Take 1 tablet (50 mg total) by mouth every 6 (six) hours as needed for moderate pain. Patient not taking: Reported on 05/04/2017 09/21/16   Gerda Diss, DO    Physical Exam: Vitals:   02/26/18 1800 02/26/18 2030 02/26/18 2100 02/26/18 2145  BP: (!) 154/77 (!) 174/84 (!) 150/72 (!) 162/78  Pulse: 77 84 87 92  Resp: (!) 23 (!) 21 (!) 21 (!) 22  Temp:      SpO2: 98% 95% 97% 96%      Constitutional: Moderately built and nourished. Vitals:   02/26/18 1800 02/26/18 2030 02/26/18 2100 02/26/18 2145  BP: (!) 154/77 (!) 174/84 (!) 150/72 (!) 162/78  Pulse: 77 84 87 92  Resp: (!) 23 (!) 21 (!) 21 (!) 22  Temp:      SpO2: 98% 95% 97% 96%   Eyes: Anicteric no pallor. ENMT: No discharge from the ears eyes nose or mouth. Neck: No mass palpated no neck rigidity. Respiratory: No rhonchi or crepitations. Cardiovascular: S1-S2 heard no murmurs appreciated. Abdomen: Soft nontender bowel sounds present. Musculoskeletal: Mild edema of the both lower extremities. Skin: No rash. Neurologic: Alert awake oriented to time place and person.  Moves all extremities 5 x 5.  No facial asymmetry tongue is midline.  Pupils are equal and reacting to light. Psychiatric: Appears normal.  Normal affect.   Labs on Admission: I have  personally reviewed following labs and imaging studies  CBC: Recent Labs  Lab 02/26/18 1723  WBC 10.3  HGB 12.7*  HCT 37.6*  MCV 95.7  PLT 235   Basic Metabolic Panel: Recent Labs  Lab 02/26/18 1723  NA 140  K 4.2  CL 109  CO2 21*  GLUCOSE 185*  BUN 25*  CREATININE 1.37*  CALCIUM 8.9   GFR: CrCl cannot be calculated (Unknown ideal weight.). Liver Function Tests: No results for input(s): AST, ALT, ALKPHOS, BILITOT, PROT, ALBUMIN in the last 168 hours. No results for input(s): LIPASE, AMYLASE in the last 168 hours. No results for input(s): AMMONIA in the last 168 hours. Coagulation Profile: No results for input(s): INR, PROTIME in the last 168 hours. Cardiac Enzymes: Recent Labs  Lab 02/26/18 1723  TROPONINI <0.03   BNP (last 3 results)  No results for input(s): PROBNP in the last 8760 hours. HbA1C: No results for input(s): HGBA1C in the last 72 hours. CBG: Recent Labs  Lab 02/26/18 1728  GLUCAP 172*   Lipid Profile: No results for input(s): CHOL, HDL, LDLCALC, TRIG, CHOLHDL, LDLDIRECT in the last 72 hours. Thyroid Function Tests: No results for input(s): TSH, T4TOTAL, FREET4, T3FREE, THYROIDAB in the last 72 hours. Anemia Panel: No results for input(s): VITAMINB12, FOLATE, FERRITIN, TIBC, IRON, RETICCTPCT in the last 72 hours. Urine analysis:    Component Value Date/Time   COLORURINE YELLOW 02/01/2014 1705   APPEARANCEUR CLEAR 02/01/2014 1705   LABSPEC 1.025 02/01/2014 1705   PHURINE 6.0 02/01/2014 1705   GLUCOSEU 100 (A) 02/01/2014 1705   HGBUR TRACE (A) 02/01/2014 1705   BILIRUBINUR SMALL (A) 02/01/2014 1705   KETONESUR 15 (A) 02/01/2014 1705   PROTEINUR 100 (A) 02/01/2014 1705   UROBILINOGEN 0.2 02/01/2014 1705   NITRITE NEGATIVE 02/01/2014 1705   LEUKOCYTESUR NEGATIVE 02/01/2014 1705   Sepsis Labs: @LABRCNTIP (procalcitonin:4,lacticidven:4) )No results found for this or any previous visit (from the past 240 hour(s)).   Radiological Exams on  Admission: Ct Head Wo Contrast  Result Date: 02/26/2018 CLINICAL DATA:  84 year old male with syncope. EXAM: CT HEAD WITHOUT CONTRAST TECHNIQUE: Contiguous axial images were obtained from the base of the skull through the vertex without intravenous contrast. COMPARISON:  Head CT dated 04/17/2017 FINDINGS: Brain: There is mild age-related atrophy and chronic microvascular ischemic changes. There is no acute intracranial hemorrhage. No mass effect or midline shift. No extra-axial fluid collection. Vascular: No hyperdense vessel or unexpected calcification. Skull: No acute skull pathology. Postsurgical changes of left occipital craniectomy. Sinuses/Orbits: No acute finding. Other: None. IMPRESSION: 1. No acute intracranial pathology. 2. Age-related atrophy and chronic microvascular ischemic changes. Electronically Signed   By: Anner Crete M.D.   On: 02/26/2018 21:33   Dg Chest Port 1 View  Result Date: 02/26/2018 CLINICAL DATA:  Near syncope.  Generalized weakness. EXAM: PORTABLE CHEST 1 VIEW COMPARISON:  02/01/2014. FINDINGS: Cardiomediastinal silhouette is stable and normal. Increased opacity at the LEFT base, dense retrocardiac region, concerning for pneumonia. RIGHT lung clear. Thoracic atherosclerosis. Severe degenerative change both shoulders and thoracic spine. IMPRESSION: Query LEFT lower lobe pneumonia.  Interval change from priors. Electronically Signed   By: Staci Righter M.D.   On: 02/26/2018 20:48    EKG: Independently reviewed.  Normal sinus rhythm with QTC of 470 ms.  Assessment/Plan Principal Problem:   Syncope and collapse Active Problems:   Primary hypercoagulable state ([place])   CARDIAC MURMUR, SYSTOLIC   Diabetes mellitus type 2 in nonobese ([place])    1. Syncope -cause not clear.  Will check 2D echo cardiac markers d-dimer EEG and MRI of the brain.  Get physical therapy consult.  Gently hydrate. 2. Diabetes mellitus type 2 -we will hold metformin while inpatient and keep patient  on sliding scale coverage. 3. History of hypertension has not been taking any medications now.  Will keep patient on PRN IV hydralazine and closely follow blood pressure trends. 4. Possible pneumonia but the chest x-ray. -Patient kept on empiric antibiotics we will check blood cultures procalcitonin levels. 5. Previous history of stroke.  MRI brain pending. 6. History of systolic heart murmur.  Follow 2D echo. 7. Sleep apnea has not been compliant recently.  CPAP at bedtime. 8. Chronic kidney disease stage II.  Follow embolic panel.  Creatinine appears to be at baseline. 9. Normocytic normochromic anemia -appears to be chronic.  Follow  CBC.  May be related to kidney disease.  There is no significant for hemoglobin and further work-up as outpatient.  DVT prophylaxis: Lovenox. Code Status: Full code. Family Communication: Patient's wife. Disposition Plan: Home. Consults called: Physical therapy. Admission status: Observation.   Rise Patience MD [facility] Pager [phone].  If 7PM-7AM, please contact night-coverage [email or web address] Password [email or web address]  02/26/2018, 10:04 PM

## 2018-02-26 NOTE — ED Notes (Signed)
ED Provider at bedside. 

## 2018-02-26 NOTE — ED Triage Notes (Signed)
Per [facility] Pt sent from the parking lot of [facility] for c/o near syncope, N/V, generalized weakness and fatigue that developed this morning. 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 18LAC no meds given en route. SR BP 160/Palp P70 O2 95% CBG152

## 2018-02-26 NOTE — ED Notes (Signed)
Patient transported to CT 

## 2018-02-27 ENCOUNTER — Observation Stay (HOSPITAL_COMMUNITY): Payer: PPO

## 2018-02-27 ENCOUNTER — Observation Stay (HOSPITAL_BASED_OUTPATIENT_CLINIC_OR_DEPARTMENT_OTHER): Payer: PPO

## 2018-02-27 ENCOUNTER — Observation Stay (HOSPITAL_BASED_OUTPATIENT_CLINIC_OR_DEPARTMENT_OTHER)
Admit: 2018-02-27 | Discharge: 2018-02-27 | Disposition: A | Discharge status: Home / Self Care | Payer: PPO | Attending: Internal Medicine | Admitting: Internal Medicine

## 2018-02-27 ENCOUNTER — Ambulatory Visit (INDEPENDENT_AMBULATORY_CARE_PROVIDER_SITE_OTHER): Payer: PPO | Admitting: Orthopaedic Surgery

## 2018-02-27 DIAGNOSIS — R55 Syncope and collapse: Secondary | ICD-10-CM

## 2018-02-27 DIAGNOSIS — R609 Edema, unspecified: Secondary | ICD-10-CM | POA: Diagnosis not present

## 2018-02-27 LAB — RAPID URINE DRUG SCREEN, HOSP PERFORMED
Amphetamines: NOT DETECTED
Barbiturates: NOT DETECTED
Benzodiazepines: NOT DETECTED
Cocaine: NOT DETECTED
OPIATES: NOT DETECTED
Tetrahydrocannabinol: NOT DETECTED

## 2018-02-27 LAB — BASIC METABOLIC PANEL
Anion gap: 8 (ref 5–15)
BUN: 24 mg/dL — ABNORMAL HIGH (ref 6–20)
CALCIUM: 8.8 mg/dL — AB (ref 8.9–10.3)
CHLORIDE: 110 mmol/L (ref 101–111)
CO2: 24 mmol/L (ref 22–32)
Creatinine, Ser: 1.33 mg/dL — ABNORMAL HIGH (ref 0.61–1.24)
GFR calc non Af Amer: 45 mL/min — ABNORMAL LOW (ref >60)
GFR, EST AFRICAN AMERICAN: 53 mL/min — AB (ref >60)
Glucose, Bld: 96 mg/dL (ref 65–99)
POTASSIUM: 3.7 mmol/L (ref 3.5–5.1)
Sodium: 142 mmol/L (ref 135–145)

## 2018-02-27 LAB — CBC
HCT: 36 % — ABNORMAL LOW (ref 39.0–52.0)
Hemoglobin: 12 g/dL — ABNORMAL LOW (ref 13.0–17.0)
MCH: 31.9 pg (ref 26.0–34.0)
MCHC: 33.3 g/dL (ref 30.0–36.0)
MCV: 95.7 fL (ref 78.0–100.0)
Platelets: 225 10*3/uL (ref 150–400)
RBC: 3.76 MIL/uL — AB (ref 4.22–5.81)
RDW: 13.9 % (ref 11.5–15.5)
WBC: 9.1 10*3/uL (ref 4.0–10.5)

## 2018-02-27 LAB — STREP PNEUMONIAE URINARY ANTIGEN: Strep Pneumo Urinary Antigen: NEGATIVE

## 2018-02-27 LAB — GLUCOSE, CAPILLARY
GLUCOSE-CAPILLARY: 124 mg/dL — AB (ref 65–99)
GLUCOSE-CAPILLARY: 123 mg/dL — AB (ref 65–99)
Glucose-Capillary: 102 mg/dL — ABNORMAL HIGH (ref 65–99)
Glucose-Capillary: 101 mg/dL — ABNORMAL HIGH (ref 65–99)

## 2018-02-27 LAB — URINALYSIS, ROUTINE W REFLEX MICROSCOPIC
Bilirubin Urine: NEGATIVE
Glucose, UA: NEGATIVE mg/dL
Hgb urine dipstick: NEGATIVE
Ketones, ur: 20 mg/dL — AB
Leukocytes, UA: NEGATIVE
Nitrite: NEGATIVE
Protein, ur: NEGATIVE mg/dL
Specific Gravity, Urine: 1.019 (ref 1.005–1.030)
pH: 6 (ref 5.0–8.0)

## 2018-02-27 LAB — PROCALCITONIN: Procalcitonin: <0.1 ng/mL

## 2018-02-27 LAB — ECHOCARDIOGRAM COMPLETE: Height: 69 in

## 2018-02-27 LAB — TROPONIN I
Troponin I: 0.03 ng/mL
Troponin I: 0.03 ng/mL

## 2018-02-27 MED ORDER — TECHNETIUM TC 99M DIETHYLENETRIAME-PENTAACETIC ACID
31.0000 | Freq: Once | INTRAVENOUS | Status: AC | PRN
  Administered 2018-02-27: 31 via INTRAVENOUS

## 2018-02-27 MED ORDER — TECHNETIUM TO 99M ALBUMIN AGGREGATED
4.0000 | Freq: Once | INTRAVENOUS | Status: AC | PRN
  Administered 2018-02-27: 4 via INTRAVENOUS

## 2018-02-27 NOTE — Progress Notes (Signed)
Set pt up on Cpap with full face mask pt  Wife at bedside pt wears full face mask states setting is unknown so placed pt auto

## 2018-02-27 NOTE — Progress Notes (Signed)
EEG completed; results pending.    

## 2018-02-27 NOTE — Evaluation (Signed)
Physical Therapy Evaluation Patient Details Name: Jeremy Stone MRN: [id number] DOB: 07-17-1926 Today's Date: 02/27/2018   History of Present Illness  Pt. is a 84 y.o. M with significant PMH of diabetes mellitus type 2, sleep apnea, hypertension, trigeminal neuralgia, stroke,  who was brought in for further evaluation of syncope with accompanying nausea and vomiting episode.  Clinical Impression  Patient evaluated by Physical Therapy with no further acute PT needs identified. Patient is at baseline level of functioning. No complaints of dizziness or lightheadedness at rest or with positional changes. Orthostatic vitals obtained and stable. Able to ambulate 400 feet with no device and supervision for balance. Dynamic Gait Index 19/24 (<19/24 indicates fall risk). All education has been completed and the patient has no further questions.  PT is signing off. Thank you for this referral.      Follow Up Recommendations No PT follow up    Equipment Recommendations  None recommended by PT    Recommendations for Other Services       Precautions / Restrictions Precautions Precautions: Fall Restrictions Weight Bearing Restrictions: No      Mobility  Bed Mobility Overal bed mobility: Modified Independent                Transfers Overall transfer level: Modified independent Equipment used: None                Ambulation/Gait Ambulation/Gait assistance: Supervision Ambulation Distance (Feet): 400 Feet Assistive device: None Gait Pattern/deviations: Step-through pattern;Trunk flexed     General Gait Details: Patient at baseline with gait. Has forward trunk flexion and decreased bilateral foot clearance. VC's for upright posture.  Stairs            Wheelchair Mobility    Modified Rankin (Stroke Patients Only)       Balance Overall balance assessment: Needs assistance Sitting-balance support: No upper extremity supported;Feet supported Sitting  balance-Leahy Scale: Normal     Standing balance support: No upper extremity supported;During functional activity Standing balance-Leahy Scale: Good Standing balance comment: Supervision needed for dynamic balance (see DGI)                 Standardized Balance Assessment Standardized Balance Assessment : Dynamic Gait Index   Dynamic Gait Index Level Surface: Mild Impairment Change in Gait Speed: Mild Impairment Gait with Horizontal Head Turns: Mild Impairment Gait with Vertical Head Turns: Normal Gait and Pivot Turn: Mild Impairment Step Over Obstacle: Normal Step Around Obstacles: Normal Steps: Mild Impairment Total Score: 19       Pertinent Vitals/Pain Pain Assessment: No/denies pain    Home Living Family/patient expects to be discharged to:: Private residence Living Arrangements: Spouse/significant other Available Help at Discharge: Family Type of Home: House Home Access: Stairs to enter Entrance Stairs-Rails: Can reach both Entrance Stairs-Number of Steps: 3 Home Layout: Two level;Able to live on main level with bedroom/bathroom Home Equipment: Shower seat;Walker - 2 wheels;Cane - single point      Prior Function Level of Independence: Independent         Comments: Stopped using RW several months ago     Hand Dominance        Extremity/Trunk Assessment   Upper Extremity Assessment Upper Extremity Assessment: Overall WFL for tasks assessed    Lower Extremity Assessment Lower Extremity Assessment: Overall WFL for tasks assessed    Cervical / Trunk Assessment Cervical / Trunk Assessment: Kyphotic;Other exceptions Cervical / Trunk Exceptions: Very limited neck flexion, extension, and rotation. Pt states this is  baseline from trigeminal neuralgia?  Communication   Communication: No difficulties  Cognition Arousal/Alertness: Awake/alert Behavior During Therapy: WFL for tasks assessed/performed Overall Cognitive Status: Within Functional Limits  for tasks assessed                                        General Comments General comments (skin integrity, edema, etc.): Patient wife present throughout    Exercises     Assessment/Plan    PT Assessment Patent does not need any further PT services  PT Problem List         PT Treatment Interventions      PT Goals (Current goals can be found in the Care Plan section)  Acute Rehab PT Goals Patient Stated Goal: wants to go home PT Goal Formulation: All assessment and education complete, DC therapy    Frequency     Barriers to discharge        Co-evaluation               AM-PAC PT "6 Clicks" Daily Activity  Outcome Measure Difficulty turning over in bed (including adjusting bedclothes, sheets and blankets)?: None Difficulty moving from lying on back to sitting on the side of the bed? : None Difficulty sitting down on and standing up from a chair with arms (e.g., wheelchair, bedside commode, etc,.)?: A Little Help needed moving to and from a bed to chair (including a wheelchair)?: A Little Help needed walking in hospital room?: A Little Help needed climbing 3-5 steps with a railing? : A Little 6 Click Score: 20    End of Session Equipment Utilized During Treatment: Gait belt Activity Tolerance: Patient tolerated treatment well Patient left: in bed;with call bell/phone within reach;with family/visitor present Nurse Communication: Mobility status PT Visit Diagnosis: Unsteadiness on feet (R26.81);Dizziness and giddiness (R42)    Time: 0160-1093 PT Time Calculation (min) (ACUTE ONLY): 33 min   Charges:   PT Evaluation $PT Eval Moderate Complexity: 1 Mod PT Treatments $Gait Training: 8-22 mins   PT G Codes:        Ellamae Sia, PT, DPT Acute Rehabilitation Services  Pager: [phone]  Willy Eddy 02/27/2018, 2:52 PM

## 2018-02-27 NOTE — Care Management Obs Status (Signed)
[place] NOTIFICATION   Patient Details  Name: Jeremy Stone MRN: [id number] Date of Birth: [birth year]   Medicare Observation Status Notification Given:  Yes    Carles Collet, RN 02/27/2018, 2:46 PM

## 2018-02-27 NOTE — Care Management Note (Addendum)
Case Management Note  Patient Details  Name: Jeremy Stone MRN: [id number] Date of Birth: September 20, 1926  Subjective/Objective:  History of diabetes mellitus type 2, sleep apnea, hypertension, trigeminal neuralgia; Admitted for Loss of consciousness.  PCP noted.              Action/Plan: Prior to admission patient lived at home with wife.  At discharge patient plans to return to same living situation.  Home DME: CPAP. NCM will continue to follow for discharge planning needs.  Expected Discharge Date:   To Be determined               Expected Discharge Plan:   To be determined  In-House Referral:  Clinical Social Work  Discharge planning Services  CM Consult  Post Acute Care Choice:    Choice offered to:     DME Arranged:    DME Agency:     HH Arranged:    HH Agency:     Status of Service:  In process, will continue to follow  Kristen Cardinal, RN 02/27/2018, 1:32 PM

## 2018-02-27 NOTE — Procedures (Signed)
ELECTROENCEPHALOGRAM REPORT  Date of Study: 02/27/2018  Patient's Name: Jeremy Stone MRN: [id number] Date of Birth: December 09, 1925  Referring Provider: Gean Birchwood, MD  Clinical History: 84 y.o. male with history of diabetes mellitus type 2, sleep apnea, hypertension, trigeminal neuralgia, was brought to the ER for further evaluation of possible syncope.  Medications: Hydralazine Rocephin Zithromax  Technical Summary: A multichannel digital EEG recording measured by the international 10-20 system with electrodes applied with paste and impedances below 5000 ohms performed in our laboratory with EKG monitoring in an awake and drowsy patient.  Hyperventilation and photic stimulation were not performed.  The digital EEG was referentially recorded, reformatted, and digitally filtered in a variety of bipolar and referential montages for optimal display.    Description: The patient is awake and drowsy during the recording.  During maximal wakefulness, there is a symmetric, medium voltage 9 Hz posterior dominant rhythm that attenuates with eye opening.  The record is symmetric.  Stage 2 sleep is not seen.  There were no epileptiform discharges or electrographic seizures seen.    EKG lead was unremarkable.  Impression: This awake and drowsy EEG is normal.    Clinical Correlation: A normal EEG does not exclude a clinical diagnosis of epilepsy.  If further clinical questions remain, prolonged EEG may be helpful.  Clinical correlation is advised.   Metta Clines, DO

## 2018-02-27 NOTE — Progress Notes (Signed)
Preliminary results by tech - Venous Duplex Lower Ext. Completed. Negative for deep and superficial vein thrombosis. Saliah Crisp, BS, RDMS, RVT  

## 2018-02-27 NOTE — Progress Notes (Signed)
Preliminary results by tech - Lower Ext. Venous Duplex Completed. Negative for deep and superficial vein thrombosis in both legs. Danielle Mink, BS, RDMS, RVT  

## 2018-02-27 NOTE — Progress Notes (Signed)
Patient request [place] papers. Patient and spouse spoke with physician and have both decided that they have thoroughly been advised about plan of care but are going to leave AMA. IV removed. Patient to be assisted to car with spouse. Dorthey Sawyer, RN

## 2018-02-27 NOTE — Discharge Summary (Signed)
Jeremy Stone, is a 84 y.o. male  DOB [birth year]  MRN [id number].  Admission date:  02/26/2018  Admitting Physician  Rise Patience, MD  Discharge Date:  02/27/2018   Primary MD  Lavone Orn, MD  Recommendations for primary care physician for things to follow:   Pt left [place] despite persuasion to stay for further cardiology and cardiothoracic evaluation for his severe aortic stenosis in the setting of recurrent syncope   Admission Diagnosis  Syncope and collapse [R55] Syncope [R55] Essential hypertension [I10]   Discharge Diagnosis  Syncope and collapse [R55] Syncope [R55] Essential hypertension [I10]    Principal Problem:   Syncope and collapse Active Problems:   Primary hypercoagulable state ([place])   CARDIAC MURMUR, SYSTOLIC   Diabetes mellitus type 2 in nonobese [facility]      Past Medical History:  Diagnosis Date  . GERD (gastroesophageal reflux disease)   . Hyperlipidemia   . Hypertension   . NIDDM (non-insulin dependent diabetes mellitus)   . OSA (obstructive sleep apnea)    uses CPAP  . Thyroid nodule   . Trigeminal neuralgia     Past Surgical History:  Procedure Laterality Date  . APPENDECTOMY    . TEE WITHOUT CARDIOVERSION N/A 05/01/2013   Procedure: TRANSESOPHAGEAL ECHOCARDIOGRAM (TEE);  Surgeon: Thayer Headings, MD;  Location: [facility];  Service: Cardiovascular;  Laterality: N/A;  Need to make sure [facility] is arranged. Notifed RN at [employer] to do this on 04-30-2013  . TOTAL HIP ARTHROPLASTY Left 02/02/2017   Procedure: LEFT TOTAL HIP ARTHROPLASTY ANTERIOR APPROACH;  Surgeon: Mcarthur Rossetti, MD;  Location: [facility];  Service: Orthopedics;  Laterality: Left;  . TRANSESOPHAGEAL ECHOCARDIOGRAM W/O CARDIOVERSION  12/06/2016  . trigeminal surgery     07-22-07- [facility]       HPI  from the history and physical done on the day of admission:   History  obtained from patient and patient's wife.  Chief Complaint: Loss of consciousness.  HPI: Jeremy Stone is a 84 y.o. male with history of diabetes mellitus type 2, sleep apnea, hypertension, trigeminal neuralgia, was brought to the ER for further evaluation of possible syncope.  Patient was moving to a new house.  In the process today around 2 PM patient at his old house became mildly dizzy and weak and had to sit on his chair.  Following which patient wife states that he has been losing consciousness in and out.  Patient did not have any incontinence of urine or any seizure-like activities.  Patient does not recall the episode.  The whole incident may have lasted for around 10 minutes.  Patient was brought to his PCPs office and was brought to ER.  He denies any chest pain shortness of breath.  Had nausea and vomiting couple episode.  Denies abdominal pain or diarrhea.  Patient wife states that recently patient has not been using his CPAP machine as advised.    ED Course: In the ER patient appears nonfocal.  Denied any chest pain  shortness of breath.  Abdomen appears benign.  CT of the head was unremarkable.  Chest x-ray shows possible infiltrates.  Troponin was negative.  EKG shows normal sinus rhythm.  Is within acceptable limits.  Patient admitted for further observation and management of syncope.     Hospital Course:   1)Syncope-  on telemetry monitored unit no significant arrhythmias,  serial troponins and EKG for rule out ACS protocol noted, CT head and brain MRI without acute strokes,  echocardiogram with severe aortic stenosis, aortic valve area 0.97, mean gradient is 41 and peak gradient is 61.  Discussed with on-call cardiologist Dr. Irish Lack... Who advised further evaluation by cardiology and cardiothoracic team to see if patient is a candidate for TAVR.  Pt left AGAINST MEDICAL ADVICE despite persuasion to stay for further cardiology and cardiothoracic evaluation for his severe  aortic stenosis in the setting of recurrent syncope .   Past medical Dopplers negative for acute DVT, VQ scan without conclusive evidence for DVT.     2)Chest x-ray with possible left lower lobe pneumonia-suspect possible aspiration in the setting of recurrent emesis due to recurrent syncope on 02/26/2018-no hypoxia, no productive cough, no fevers.   Pt left AGAINST MEDICAL ADVICE despite persuasion to stay for further cardiology and cardiothoracic evaluation for his severe aortic stenosis in the setting of recurrent syncope   Discharge Condition: Patient left AGAINST MEDICAL ADVICE  Follow UP  Follow-up Information    Lavone Orn, MD Follow up.   Specialty:  Internal Medicine Contact information: [address] [place] [address] [phone]            Consults obtained -  Cardiology--- gust with Dr. Irish Lack Diet and Activity recommendation:  As advised  Discharge Instructions    Pt left AGAINST MEDICAL ADVICE despite persuasion to stay for further cardiology and cardiothoracic evaluation for his severe aortic stenosis in the setting of recurrent syncope     Discharge Medications    Pt left AGAINST MEDICAL ADVICE despite persuasion to stay for further cardiology and cardiothoracic evaluation for his severe aortic stenosis in the setting of recurrent syncope   Major procedures and Radiology Reports - PLEASE review detailed and final reports for all details, in brief -    Ct Head Wo Contrast  Result Date: 02/26/2018 CLINICAL DATA:  84 year old male with syncope. EXAM: CT HEAD WITHOUT CONTRAST TECHNIQUE: Contiguous axial images were obtained from the base of the skull through the vertex without intravenous contrast. COMPARISON:  Head CT dated 04/17/2017 FINDINGS: Brain: There is mild age-related atrophy and chronic microvascular ischemic changes. There is no acute intracranial hemorrhage. No mass effect or midline shift. No extra-axial fluid collection.  Vascular: No hyperdense vessel or unexpected calcification. Skull: No acute skull pathology. Postsurgical changes of left occipital craniectomy. Sinuses/Orbits: No acute finding. Other: None. IMPRESSION: 1. No acute intracranial pathology. 2. Age-related atrophy and chronic microvascular ischemic changes. Electronically Signed   By: Anner Crete M.D.   On: 02/26/2018 21:33   Mr Brain Wo Contrast  Result Date: 02/27/2018 CLINICAL DATA:  Focal neuro deficit.  Syncopal event. EXAM: MRI HEAD WITHOUT CONTRAST TECHNIQUE: Multiplanar, multiecho pulse sequences of the brain and surrounding structures were obtained without intravenous contrast. COMPARISON:  04/19/2017 FINDINGS: Brain: No acute infarction, hemorrhage, hydrocephalus, extra-axial collection or mass lesion. Mild for age chronic small vessel ischemic type change in the cerebral white matter and pons. Remote lacunar infarct in the left pons. Cerebral volume loss which may preferentially involve the temporal  lobes, likely stable Vascular: Major flow voids are preserved, including vertebrobasilar. Vertebrobasilar tortuosity. Skull and upper cervical spine: Flowing and ankylosing bulky ventral osteophyte, seen to a greater extent on comparison MRI. There is severe adjacent segment degeneration at C1-2 with. Anterior displacement of the C 1 ring versus posterior arch hypoplasia. There is spinal stenosis with cord impingement, stable from prior. No visible cord signal abnormality. Sinuses/Orbits: Negative IMPRESSION: 1. No acute finding.  Stable compared to 2018. 2. Chronic small vessel ischemia and atrophy. 3. Probable diffuse idiopathic skeletal hyperostosis of the partially covered cervical spine. There is severe adjacent segment degeneration at C1-2 causing spinal stenosis and cord impingement. Electronically Signed   By: Monte Fantasia M.D.   On: 02/27/2018 12:05   Nm Pulmonary Perf And Vent  Result Date: 02/27/2018 CLINICAL DATA:  84 year old male with  syncope. EXAM: NUCLEAR MEDICINE VENTILATION - PERFUSION LUNG SCAN TECHNIQUE: Ventilation images were obtained in multiple projections using inhaled aerosol Tc-35m DTPA. Perfusion images were obtained in multiple projections after intravenous injection of Tc-69m-MAA. RADIOPHARMACEUTICALS:  31.0 mCi of Tc-34m DTPA aerosol inhalation and 4.2 mCi Tc56m-MAA IV COMPARISON:  Portable chest radiograph 02/26/2018. FINDINGS: Ventilation: Asymmetrically decreased ventilation in the left lung, but no focal ventilation defect. Perfusion: Mildly heterogeneous perfusion radiotracer bilaterally. Asymmetric decreased perfusion in the left lung, corresponding to the ventilation finding. No wedge shaped peripheral perfusion defects to suggest acute pulmonary embolism. IMPRESSION: 1. Nonspecific asymmetrically decreased ventilation and perfusion (matched) in the left lung, perhaps due to chronic lung disease or pneumonia. 2. No strong evidence of acute pulmonary embolus. Electronically Signed   By: Genevie Ann M.D.   On: 02/27/2018 14:18   Dg Chest Port 1 View  Result Date: 02/26/2018 CLINICAL DATA:  Near syncope.  Generalized weakness. EXAM: PORTABLE CHEST 1 VIEW COMPARISON:  02/01/2014. FINDINGS: Cardiomediastinal silhouette is stable and normal. Increased opacity at the LEFT base, dense retrocardiac region, concerning for pneumonia. RIGHT lung clear. Thoracic atherosclerosis. Severe degenerative change both shoulders and thoracic spine. IMPRESSION: Query LEFT lower lobe pneumonia.  Interval change from priors. Electronically Signed   By: Staci Righter M.D.   On: 02/26/2018 20:48    Micro Results     Recent Results (from the past 240 hour(s))  Culture, blood (routine x 2) Call MD if unable to obtain prior to antibiotics being given     Status: None (Preliminary result)   Collection Time: 02/26/18 10:34 PM  Result Value Ref Range Status   Specimen Description BLOOD RIGHT ANTECUBITAL  Final   Special Requests   Final     BOTTLES DRAWN AEROBIC AND ANAEROBIC Blood Culture adequate volume   Culture   Final    NO GROWTH < 12 HOURS Performed at [facility], [address], [place], [place] [address]    Report Status PENDING  Incomplete  Culture, blood (routine x 2) Call MD if unable to obtain prior to antibiotics being given     Status: None (Preliminary result)   Collection Time: 02/26/18 10:41 PM  Result Value Ref Range Status   Specimen Description BLOOD BLOOD RIGHT FOREARM  Final   Special Requests   Final    BOTTLES DRAWN AEROBIC AND ANAEROBIC Blood Culture adequate volume   Culture   Final    NO GROWTH < 12 HOURS Performed at [facility], [place] [address], [place], [place] [address]    Report Status PENDING  Incomplete    Today   Subjective    Jeremy Stone  today Pt left AGAINST MEDICAL ADVICE despite persuasion to stay for further cardiology and cardiothoracic evaluation for his severe aortic stenosis in the setting of recurrent syncope          Patient has been seen and examined prior to discharge   Objective   Blood pressure (!) 171/74, pulse 76, temperature 98.1 F (36.7 C), temperature source Oral, resp. rate 20, height 5\' 9"  (1.753 m), SpO2 98 %.   Intake/Output Summary (Last 24 hours) at 02/27/2018 1802 Last data filed at 02/27/2018 0148 Gross per 24 hour  Intake 505 ml  Output 0 ml  Net 505 ml    Exam Gen:- Awake Alert,  In no apparent distress HEENT:- [place].AT, No sclera icterus Neck-Supple Neck,No JVD,.  Lungs-  CTAB , good air movement CV- S1, S2 normal, 3/6 SM Abd-  +ve B.Sounds, Abd Soft, No tenderness,    Extremity/Skin:- No  edema,   good pulses Psych-affect is appropriate, oriented x3 Neuro-no new focal deficits, no tremors   Data Review   CBC w Diff:  Lab Results  Component Value Date   WBC 9.1 02/27/2018   HGB 12.0 (L) 02/27/2018   HGB 14.2 08/27/2009   HCT 36.0 (L) 02/27/2018   HCT 41.5 08/27/2009   PLT 225 02/27/2018   PLT 248 08/27/2009    LYMPHOPCT 10 (L) 05/02/2013   LYMPHOPCT 28.3 08/27/2009   MONOPCT 5 05/02/2013   MONOPCT 6.1 08/27/2009   EOSPCT 2 05/02/2013   EOSPCT 2.3 08/27/2009   BASOPCT 0 05/02/2013   BASOPCT 0.4 08/27/2009    CMP:  Lab Results  Component Value Date   NA 142 02/27/2018   K 3.7 02/27/2018   CL 110 02/27/2018   CO2 24 02/27/2018   BUN 24 (H) 02/27/2018   CREATININE 1.33 (H) 02/27/2018   PROT 6.8 02/26/2018   ALBUMIN 3.7 02/26/2018   BILITOT 0.7 02/26/2018   ALKPHOS 60 02/26/2018   AST 16 02/26/2018   ALT 12 (L) 02/26/2018    Pt left AGAINST MEDICAL ADVICE despite persuasion to stay for further cardiology and cardiothoracic evaluation for his severe aortic stenosis in the setting of recurrent syncope  Total Discharge time is about 33 minutes  Roxan Hockey M.D on 02/27/2018 at 6:02 PM  [facility]   Office  [phone]  Voice Recognition Vivian[employer] dictation system was used to create this note, attempts have been made to correct errors. Please contact the author with questions and/or clarifications.

## 2018-02-28 ENCOUNTER — Telehealth: Payer: Self-pay

## 2018-02-28 LAB — LEGIONELLA PNEUMOPHILA SEROGP 1 UR AG: L. PNEUMOPHILA SEROGP 1 UR AG: NEGATIVE

## 2018-02-28 LAB — URINE CULTURE: Special Requests: NORMAL

## 2018-02-28 NOTE — Telephone Encounter (Signed)
Pt left [facility] AMA on 02/27/2018 prior to evaluation by cardiology.  Echo on 02/27/2018 showed severe AS with mean gradient 41 mmHg/peak gradient 61 mmHg, AVA 0.83 cm2.  I contacted the pt by phone to arrange outpatient Cardiology evaluation with the Structural Heart Team.  The pt states he has had a heart murmur since birth. The pt has never seen a cardiologist for evaluation.  Since the pt left the hospital he has been okay and had no further syncopal spells. I provided the pt with information about the diagnosis of severe AS.  The pt does not wish to make an appointment with Cardiology at this time.  The pt has scheduled a follow-up appointment with Dr Lavone Orn on 03/05/18 at 10:45 AM.  The pt states that he would like to see his PCP for further instruction about evaluation/treatment of his heart valve.  I asked the pt if I could call him next week to follow-up on his PCP apt.  The pt agreed to an additional phone call and said he would appreciate if I followed up with him next week.

## 2018-03-03 LAB — CULTURE, BLOOD (ROUTINE X 2)
CULTURE: NO GROWTH
CULTURE: NO GROWTH
Special Requests: ADEQUATE
Special Requests: ADEQUATE

## 2018-03-05 DIAGNOSIS — I350 Nonrheumatic aortic (valve) stenosis: Secondary | ICD-10-CM | POA: Diagnosis not present

## 2018-03-05 DIAGNOSIS — R55 Syncope and collapse: Secondary | ICD-10-CM | POA: Diagnosis not present

## 2018-03-05 DIAGNOSIS — F0390 Unspecified dementia without behavioral disturbance: Secondary | ICD-10-CM | POA: Diagnosis not present

## 2018-03-06 ENCOUNTER — Ambulatory Visit (INDEPENDENT_AMBULATORY_CARE_PROVIDER_SITE_OTHER): Payer: PPO | Admitting: Orthopaedic Surgery

## 2018-03-12 ENCOUNTER — Encounter: Payer: Self-pay | Admitting: Cardiovascular Disease

## 2018-03-12 NOTE — Progress Notes (Signed)
Valve Clinic Consult Note  Chief Complaint  Patient presents with  . New Patient (Initial Visit)    severe aortic valve stenosis   History of Present Illness: 84 yo male with history of GERD, HLD, HTN, diabetes mellitus, trigeminal neuralgia, sleep apnea, prior CVA and severe aortic stenosis who is here today as a new consult, referred by Dr. Laurann Montana, for evaluation of his severe aortic stenosis. He has never been seen by cardiology. He was admitted to [facility] 02/27/18 after a syncopal episode. CT head with no acute findings. Brain MRI with no acute findings. Chest x-ray with possible infiltrate. Echo 02/27/18 showed normal LV size with moderate concentric LVH, normal LVEF=60-65%. There was grade 1 diastolic dysfunction. The aortic valve is heavily calcified limited leaflet mobility. Mean gradient 41 mmHg, peak gradient 61 mmHg, AVA 0.83 cm2, DVI 0.22. There is moderate MAC. RVSP estimated at 32 mmHg. The valve team was asked to see him on 02/27/18 but he left the hospital against medical advice before we could see him that day.   He tells me that he has no dyspnea at home. He has no chest pain. He has had balance issues following his stroke in 2018. He has had lower extremity edema. This resolved after he began elevating his legs. He lives at home with his wife who is younger. 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 He has only been taking Metformin. He has no dentures. He has not seen a dentist in 15 years.   Primary Care Physician: Lavone Orn, MD  Referring Physician: Dr. Lavone Orn Primary Cardiologist: New  Past Medical History:  Diagnosis Date  . GERD (gastroesophageal reflux disease)   . Hyperlipidemia   . Hypertension   . NIDDM (non-insulin dependent diabetes mellitus)   . OSA (obstructive sleep apnea)    uses CPAP  . Severe aortic stenosis   . Thyroid nodule   . Trigeminal neuralgia     Past Surgical History:  Procedure Laterality Date  . APPENDECTOMY    .  TEE WITHOUT CARDIOVERSION N/A 05/01/2013   Procedure: TRANSESOPHAGEAL ECHOCARDIOGRAM (TEE);  Surgeon: Thayer Headings, MD;  Location: [facility];  Service: Cardiovascular;  Laterality: N/A;  Need to make sure [facility] is arranged. Notifed RN at [facility] to do this on 04-30-2013  . TOTAL HIP ARTHROPLASTY Left 02/02/2017   Procedure: LEFT TOTAL HIP ARTHROPLASTY ANTERIOR APPROACH;  Surgeon: Mcarthur Rossetti, MD;  Location: [facility];  Service: Orthopedics;  Laterality: Left;  . TRANSESOPHAGEAL ECHOCARDIOGRAM W/O CARDIOVERSION  12/06/2016  . trigeminal surgery     07-22-07- [facility]    Current Outpatient Medications  Medication Sig Dispense Refill  . metFORMIN (GLUCOPHAGE) 500 MG tablet Take 1 tablet (500 mg total) by mouth 2 (two) times daily with a meal. 180 tablet 3  . traMADol (ULTRAM) 50 MG tablet Take 1 tablet (50 mg total) by mouth every 6 (six) hours as needed for moderate pain. 30 tablet 0  . b complex vitamins tablet Take 1 tablet by mouth daily.    . Cholecalciferol (VITAMIN D3) 50000 units TABS Take 5,000 Units by mouth daily.    . Coenzyme Q10 (CO Q-10) 100 MG CAPS Take by mouth daily.    . Cyanocobalamin (VITAMIN B-12) 1000 MCG SUBL Place 1,000 mcg under the tongue daily.    . fluticasone (FLONASE) 50 MCG/ACT nasal spray Place 2 sprays into both nostrils daily. (Patient not taking: Reported on 03/13/2018) 16 g 6  . meclizine (ANTIVERT) 25 MG tablet  Take 25 mg by mouth 3 (three) times daily as needed for dizziness.    Marland Kitchen PRESCRIPTION MEDICATION CPAP    . PRESCRIPTION MEDICATION Apply 1 application topically as needed. Pain ointment    . Probiotic Product (PROBIOTIC PO) Take 1 tablet by mouth daily.     Current Facility-Administered Medications  Medication Dose Route Frequency Provider Last Rate Last Dose  . lidocaine (PF) (XYLOCAINE) 1 % injection 0.3 mL  0.3 mL Other Once Magnus Sinning, MD        Allergies  Allergen Reactions  . Lactose Intolerance (Gi)   . Sulfa Antibiotics Other (See  Comments)    dizziness    Social History   Socioeconomic History  . Marital status: Married    Spouse name: Marlowe Kays  . Number of children: 3  . Years of education: 23  . Highest education level: Not on file  Occupational History  . 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   Social Needs  . Financial resource strain: Not on file  . Food insecurity:    Worry: Not on file    Inability: Not on file  . Transportation needs:    Medical: Not on file    Non-medical: Not on file  Tobacco Use  . Smoking status: Former Smoker    Packs/day: 1.00    Years: 20.00    Pack years: 20.00    Types: Cigarettes    Last attempt to quit: 10/23/1982    Years since quitting: 35.4  . Smokeless tobacco: Never Used  Substance and Sexual Activity  . Alcohol use: Yes    Alcohol/week: 5.0 oz    Types: 10 Standard drinks or equivalent per week    Comment: wine with dinner  . Drug use: No  . Sexual activity: Yes    Partners: Female  Lifestyle  . Physical activity:    Days per week: Not on file    Minutes per session: Not on file  . Stress: Not on file  Relationships  . Social connections:    Talks on phone: Not on file    Gets together: Not on file    Attends religious service: Not on file    Active member of club or organization: Not on file    Attends meetings of clubs or organizations: Not on file    Relationship status: Not on file  . Intimate partner violence:    Fear of current or ex partner: Not on file    Emotionally abused: Not on file    Physically abused: Not on file    Forced sexual activity: Not on file  Other Topics Concern  . Not on file  Social History Narrative   Entrepeneurial businessman - water treatment products. Married  And divorced - 2 children: 1 son - Psychiatric nurse now in [place]; 1 dtr , several grandhchildren. 2nd marriage - doing well.    Family History  Problem Relation Age of Onset  . Dementia Mother   .  Dementia Sister   . Cancer Father     Review of Systems:  As stated in the HPI and otherwise negative.   BP (!) 150/72   Pulse 77   Ht _0  (1.753 m)   Wt 172 lb 1.9 oz (78.1 kg)   SpO2 99%   BMI 25.42 kg/m   Physical Examination: General: Well developed, well nourished, NAD  HEENT: OP clear, mucus membranes moist  SKIN: warm, dry. No rashes. Neuro:  No focal deficits  Musculoskeletal: Muscle strength 5/5 all ext  Psychiatric: Mood and affect normal  Neck: No JVD, no carotid bruits, no thyromegaly, no lymphadenopathy.  Lungs:Clear bilaterally, no wheezes, rhonci, crackles Cardiovascular: Regular rate and rhythm. Loud, harsh, late peaking systolic murmur.  Abdomen:Soft. Bowel sounds present. Non-tender.  Extremities: No lower extremity edema. Pulses are 2 + in the bilateral DP/PT.  Echo 02/27/18: Left ventricle: The cavity size was normal. There was moderate   concentric hypertrophy. Systolic function was normal. The   estimated ejection fraction was in the range of 60% to 65%. Wall   motion was normal; there were no regional wall motion   abnormalities. Doppler parameters are consistent with abnormal   left ventricular relaxation (grade 1 diastolic dysfunction). The   E/e&' ratio is >15, suggesting elevated LV filling pressure. - Aortic valve: Calcified valve with severe stenosis. No   significant regurgitation. Mean gradient (S): 41 mm Hg. Peak   gradient (S): 61 mm Hg. Valve area (VTI): 0.97 cm^2. Valve area   (Vmax): 0.84 cm^2. Valve area (Vmean): 0.83 cm^2. - Mitral valve: Poorly visualized. Calcified annulus. There was no   significant regurgitation. - Left atrium: The atrium was normal in size. - Right ventricle: Poorly visualized. - Right atrium: The atrium was normal in size. - Tricuspid valve: There was trivial regurgitation. - Pulmonary arteries: PA peak pressure: 32 mm Hg (S). - Inferior vena cava: The vessel was dilated. The respirophasic   diameter changes  were blunted (< 50%), consistent with elevated   central venous pressure.  Impressions:  - LVEF 60-65%, moderate LVH, normal wall motion, grade 1 DD,   markedly elevated LV filling pressure, severe calcific aortic   stenosis - mean gradient of 41 mmHg, AVA around 0.8 cm2. There is   moderate MAC with no significant MR, normal LA size, trival TR,   RVSP 32 mmHg, dilated IVC.  Left ventricle:  The cavity size was normal. There was moderate concentric hypertrophy. Systolic function was normal. The estimated ejection fraction was in the range of 60% to 65%. Wall motion was normal; there were no regional wall motion abnormalities. Doppler parameters are consistent with abnormal left ventricular relaxation (grade 1 diastolic dysfunction). The E/e&' ratio is >15, suggesting elevated LV filling pressure.  ------------------------------------------------------------------- Aortic valve:  Calcified valve with severe stenosis. No significant regurgitation.  Doppler:     VTI ratio of LVOT to aortic valve: 0.25. Valve area (VTI): 0.97 cm^2. Indexed valve area (VTI): 0.47 cm^2/m^2. Peak velocity ratio of LVOT to aortic valve: 0.22. Valve area (Vmax): 0.84 cm^2. Indexed valve area (Vmax): 0.41 cm^2/m^2. Mean velocity ratio of LVOT to aortic valve: 0.22. Valve area (Vmean): 0.83 cm^2. Indexed valve area (Vmean): 0.41 cm^2/m^2. Mean gradient (S): 41 mm Hg. Peak gradient (S): 61 mm Hg.  ------------------------------------------------------------------- Mitral valve:  Poorly visualized.  Calcified annulus.  Doppler: There was no significant regurgitation.    Peak gradient (D): 6 mm Hg.  ------------------------------------------------------------------- Left atrium:  The atrium was normal in size.  ------------------------------------------------------------------- Right ventricle:  Poorly visualized.  ------------------------------------------------------------------- Pulmonic valve:     The valve appears to be grossly normal. Doppler:  There was no significant regurgitation.  ------------------------------------------------------------------- Tricuspid valve:   Doppler:  There was trivial regurgitation.  ------------------------------------------------------------------- Pulmonary artery:   The main pulmonary artery was normal-sized.  ------------------------------------------------------------------- Right atrium:  The atrium was normal in size.  ------------------------------------------------------------------- Pericardium:  There was no pericardial effusion.  ------------------------------------------------------------------- Systemic veins: Inferior vena cava: The vessel was  dilated. The respirophasic diameter changes were blunted (< 50%), consistent with elevated central venous pressure.  ------------------------------------------------------------------- Measurements   Left ventricle                            Value          Reference  LV ID, ED, PLAX chordal           (L)     30.2  mm       43 - 52  LV ID, ES, PLAX chordal                   27    mm       23 - 38  LV fx shortening, PLAX chordal    (L)     11    %        >=29  LV PW thickness, ED                       14.8  mm       ---------  IVS/LV PW ratio, ED                       0.95           <=1.3  Stroke volume, 2D                         84    ml       ---------  Stroke volume/bsa, 2D                     41    ml/m^2   ---------  LV e&', lateral                            4.79  cm/s     ---------  LV E/e&', lateral                          26.1           ---------  LV e&', medial                             5.66  cm/s     ---------  LV E/e&', medial                           22.08          ---------  LV e&', average                            5.23  cm/s     ---------  LV E/e&', average                          23.92          ---------    Ventricular septum                        Value           Reference  IVS thickness, ED  14.1  mm       ---------    LVOT                                      Value          Reference  LVOT ID, S                                22    mm       ---------  LVOT area                                 3.8   cm^2     ---------  LVOT peak velocity, S                     86.7  cm/s     ---------  LVOT mean velocity, S                     65.9  cm/s     ---------  LVOT VTI, S                               22    cm       ---------    Aortic valve                              Value          Reference  Aortic valve peak velocity, S             392   cm/s     ---------  Aortic valve mean velocity, S             301   cm/s     ---------  Aortic valve VTI, S                       86.4  cm       ---------  Aortic mean gradient, S                   41    mm Hg    ---------  Aortic peak gradient, S                   61    mm Hg    ---------  VTI ratio, LVOT/AV                        0.25           ---------  Aortic valve area, VTI                    0.97  cm^2     ---------  Aortic valve area/bsa, VTI                0.47  cm^2/m^2 ---------  Velocity ratio, peak, LVOT/AV             0.22           ---------  Aortic valve area, peak velocity  0.84  cm^2     ---------  Aortic valve area/bsa, peak               0.41  cm^2/m^2 ---------  velocity  Velocity ratio, mean, LVOT/AV             0.22           ---------  Aortic valve area, mean velocity          0.83  cm^2     ---------  Aortic valve area/bsa, mean               0.41  cm^2/m^2 ---------  velocity    Left atrium                               Value          Reference  LA ID, A-P, ES                            36    mm       ---------  LA ID/bsa, A-P                            1.76  cm/m^2   <=2.2  LA volume, S                              30.3  ml       ---------  LA volume/bsa, S                          14.8  ml/m^2   ---------  LA volume, ES, 1-p A4C                     30.9  ml       ---------  LA volume/bsa, ES, 1-p A4C                15.1  ml/m^2   ---------  LA volume, ES, 1-p A2C                    29.9  ml       ---------  LA volume/bsa, ES, 1-p A2C                14.6  ml/m^2   ---------    Mitral valve                              Value          Reference  Mitral E-wave peak velocity               125   cm/s     ---------  Mitral A-wave peak velocity               148   cm/s     ---------  Mitral deceleration time          (H)     373   ms       150 - 230  Mitral peak gradient, D                   6  mm Hg    ---------  Mitral E/A ratio, peak                    0.8            ---------    Pulmonary arteries                        Value          Reference  PA pressure, S, DP                (H)     32    mm Hg    <=30    Tricuspid valve                           Value          Reference  Tricuspid regurg peak velocity            204   cm/s     ---------  Tricuspid peak RV-RA gradient             17    mm Hg    ---------    Systemic veins                            Value          Reference  Estimated CVP                             3     mm Hg    ---------    Right ventricle                           Value          Reference  TAPSE                                     25.9  mm       ---------  RV pressure, S, DP                        20    mm Hg    <=30  RV s&', lateral, S                         12.7  cm/s     ---------  EKG:  EKG is not ordered today. The ekg ordered today demonstrates   Recent Labs: 02/26/2018: ALT 12; Magnesium 1.9; TSH 2.532 02/27/2018: BUN 24; Creatinine, Ser 1.33; Hemoglobin 12.0; Platelets 225; Potassium 3.7; Sodium 142    Wt Readings from Last 3 Encounters:  03/13/18 172 lb 1.9 oz (78.1 kg)  02/15/17 187 lb (84.8 kg)  02/02/17 186 lb 8 oz (84.6 kg)     Other studies Reviewed: Additional studies/ records that were reviewed today include: Echo images, hospital records. Review of the above records demonstrates:     Assessment and Plan:   1. Severe aortic stenosis: He has severe, stage D aortic valve stenosis. I have personally reviewed the echo images. The aortic valve is thickened, calcified with limited leaflet mobility. I think he would benefit from AVR.  Given advanced age, he is not a good candidate for conventional AVR by surgical approach. I think he may be a good candidate for TAVR.   STS Risk Score:  Risk of Mortality: 2.941% Renal Failure: 5.349% Permanent Stroke: 5.186% Prolonged Ventilation: 10.551% DSW Infection: 0.112% Reoperation: 4.743% Morbidity or Mortality: 19.440% Short Length of Stay: 15.110% Long Length of Stay: 11.431%   I have reviewed the natural history of aortic stenosis with the patient and his wife who is present today. We have discussed the limitations of medical therapy and the poor prognosis associated with symptomatic aortic stenosis. We have reviewed potential treatment options, including palliative medical therapy, conventional surgical aortic valve replacement, and transcatheter aortic valve replacement. We discussed treatment options in the context of the patient's specific comorbid medical conditions.   He would like to think about this before agreeing to proceed with planning for TAVR. I will plan to see him back in 3 months. If he changes his mind or has change in his symptoms, I will arrange a right and left heart catheterization at that time. Risks and benefits of the cath procedure and the TAVR procedure reviewed with the patient.   30 minutes of chart prep with record review. 60 minutes face to face time with patient.   Current medicines are reviewed at length with the patient today.  The patient does not have concerns regarding medicines.  The following changes have been made:  no change  Labs/ tests ordered today include:  No orders of the defined types were placed in this encounter.    Disposition:   FU with me in 3 months   Signed, Lauree Chandler, MD 03/13/2018 11:03 AM    [facility] [address], [place], [place]  [address] Phone: [phone]; Fax: [phone]

## 2018-03-13 ENCOUNTER — Ambulatory Visit (INDEPENDENT_AMBULATORY_CARE_PROVIDER_SITE_OTHER): Payer: PPO | Admitting: Orthopaedic Surgery

## 2018-03-13 ENCOUNTER — Encounter: Payer: Self-pay | Admitting: Cardiovascular Disease

## 2018-03-13 ENCOUNTER — Ambulatory Visit: Payer: PPO | Admitting: Cardiovascular Disease

## 2018-03-13 VITALS — BP 150/72 | HR 77 | Ht 69.0 in | Wt 172.1 lb

## 2018-03-13 DIAGNOSIS — I350 Nonrheumatic aortic (valve) stenosis: Secondary | ICD-10-CM | POA: Diagnosis not present

## 2018-03-13 NOTE — Patient Instructions (Signed)
Medication Instructions:  Your physician recommends that you continue on your current medications as directed. Please refer to the Current Medication list given to you today.   Labwork: none  Testing/Procedures: none  Follow-Up: Your physician recommends that you schedule a follow-up appointment in: 3-4 months. Scheduled for August 22,2019 at 10:40    Any Other Special Instructions Will Be Listed Below (If Applicable).     If you need a refill on your cardiac medications before your next appointment, please call your pharmacy.

## 2018-03-20 ENCOUNTER — Ambulatory Visit (INDEPENDENT_AMBULATORY_CARE_PROVIDER_SITE_OTHER): Payer: PPO

## 2018-03-20 ENCOUNTER — Encounter (INDEPENDENT_AMBULATORY_CARE_PROVIDER_SITE_OTHER): Payer: Self-pay | Admitting: Orthopaedic Surgery

## 2018-03-20 ENCOUNTER — Ambulatory Visit (INDEPENDENT_AMBULATORY_CARE_PROVIDER_SITE_OTHER): Payer: PPO | Admitting: Orthopaedic Surgery

## 2018-03-20 DIAGNOSIS — Z96642 Presence of left artificial hip joint: Secondary | ICD-10-CM | POA: Diagnosis not present

## 2018-03-20 NOTE — Progress Notes (Signed)
The patient is now 14 months status post a left total hip arthroplasty.  He is 84 years old and does ambulate with a cane.  He has had a recent hospitalization due to syncopal episode.  He also had a stroke a few months after surgery.  As far as his left hip goes he says he is doing well and is pain-free.  I can easily put both hips the range of motion without any difficulties or blocks to motion all and no signs of pain.  I did watch him ambulate slowly with a cane but otherwise he appears to be in stable position right now.  X-rays of the pelvis and left hip shows a left total hip arthroplasty with no complicating features.  I did go over his exam and x-rays with his family and him.  All questions concerns were answered and addressed.  At this point will follow-up as needed.

## 2018-04-15 DIAGNOSIS — K5900 Constipation, unspecified: Secondary | ICD-10-CM | POA: Diagnosis not present

## 2018-06-13 ENCOUNTER — Ambulatory Visit: Payer: PPO | Admitting: Cardiovascular Disease

## 2018-06-27 DIAGNOSIS — G4733 Obstructive sleep apnea (adult) (pediatric): Secondary | ICD-10-CM | POA: Diagnosis not present

## 2018-07-01 DIAGNOSIS — G4700 Insomnia, unspecified: Secondary | ICD-10-CM | POA: Diagnosis not present

## 2018-07-01 DIAGNOSIS — F0390 Unspecified dementia without behavioral disturbance: Secondary | ICD-10-CM | POA: Diagnosis not present

## 2018-07-01 DIAGNOSIS — W19XXXA Unspecified fall, initial encounter: Secondary | ICD-10-CM | POA: Diagnosis not present

## 2018-07-01 DIAGNOSIS — S0101XA Laceration without foreign body of scalp, initial encounter: Secondary | ICD-10-CM | POA: Diagnosis not present

## 2018-07-02 ENCOUNTER — Telehealth: Payer: Self-pay

## 2018-07-02 NOTE — Telephone Encounter (Signed)
I contacted the pt in regards to further follow-up with Dr Angelena Form to evaluate Aortic Stenosis.  The pt had an appointment scheduled 06/13/18 but he cancelled because "he did not need to be seen". The pt was seen by his PCP Dr Laurann Montana yesterday and was told he is doing well.  The pt plans to continue seeing his PCP and will follow-up with cardiology if needed.

## 2018-07-03 NOTE — Telephone Encounter (Signed)
OK. Thanks.

## 2018-07-04 ENCOUNTER — Telehealth: Payer: Self-pay | Admitting: Pulmonary Disease

## 2018-07-04 DIAGNOSIS — Z8673 Personal history of transient ischemic attack (TIA), and cerebral infarction without residual deficits: Secondary | ICD-10-CM | POA: Diagnosis not present

## 2018-07-04 DIAGNOSIS — E1122 Type 2 diabetes mellitus with diabetic chronic kidney disease: Secondary | ICD-10-CM | POA: Diagnosis not present

## 2018-07-04 DIAGNOSIS — G4733 Obstructive sleep apnea (adult) (pediatric): Secondary | ICD-10-CM

## 2018-07-04 DIAGNOSIS — S21211D Laceration without foreign body of right back wall of thorax without penetration into thoracic cavity, subsequent encounter: Secondary | ICD-10-CM | POA: Diagnosis not present

## 2018-07-04 DIAGNOSIS — G4700 Insomnia, unspecified: Secondary | ICD-10-CM | POA: Diagnosis not present

## 2018-07-04 DIAGNOSIS — I129 Hypertensive chronic kidney disease with stage 1 through stage 4 chronic kidney disease, or unspecified chronic kidney disease: Secondary | ICD-10-CM | POA: Diagnosis not present

## 2018-07-04 DIAGNOSIS — Z9181 History of falling: Secondary | ICD-10-CM | POA: Diagnosis not present

## 2018-07-04 DIAGNOSIS — I350 Nonrheumatic aortic (valve) stenosis: Secondary | ICD-10-CM | POA: Diagnosis not present

## 2018-07-04 DIAGNOSIS — S0101XD Laceration without foreign body of scalp, subsequent encounter: Secondary | ICD-10-CM | POA: Diagnosis not present

## 2018-07-04 DIAGNOSIS — Z96642 Presence of left artificial hip joint: Secondary | ICD-10-CM | POA: Diagnosis not present

## 2018-07-04 DIAGNOSIS — N183 Chronic kidney disease, stage 3 (moderate): Secondary | ICD-10-CM | POA: Diagnosis not present

## 2018-07-04 DIAGNOSIS — Z7984 Long term (current) use of oral hypoglycemic drugs: Secondary | ICD-10-CM | POA: Diagnosis not present

## 2018-07-04 DIAGNOSIS — F0390 Unspecified dementia without behavioral disturbance: Secondary | ICD-10-CM | POA: Diagnosis not present

## 2018-07-04 NOTE — Telephone Encounter (Signed)
Order- DME change CPAP to auto 8-11  Please encourage regular use every night. If mask is not comfortable please have him discuss it with his DME.

## 2018-07-04 NOTE — Telephone Encounter (Signed)
Called and spoke with pt who states he feels like his pressure needs to be adjusted.  Pt states he is not getting any sleep and states he has also has been losing a lot of weight, maybe about 50#.  Pt denies any pain involved, just the inability to sleep.  Due to Dr. Elsworth Soho not being available, routing to DOD.  Dr. Annamaria Boots, please advise on this for pt what you think we need to do in regards to pt's cpap pressures if you think it is to high and if we do need to adjust it for pt, or if you think there is anything else we need to do.  A download has been printed.

## 2018-07-04 NOTE — Telephone Encounter (Signed)
Spoke with patient. He is aware of Dr. Janee Morn response and is ok with [place] changing his CPAP settings. He verified that he is still using [facility] as his DME. Will go ahead and send order to [facility]. Patient is aware.   Nothing further needed at time of call.

## 2018-07-17 DIAGNOSIS — E7800 Pure hypercholesterolemia, unspecified: Secondary | ICD-10-CM | POA: Diagnosis not present

## 2018-07-17 DIAGNOSIS — E1169 Type 2 diabetes mellitus with other specified complication: Secondary | ICD-10-CM | POA: Diagnosis not present

## 2018-07-17 DIAGNOSIS — G4733 Obstructive sleep apnea (adult) (pediatric): Secondary | ICD-10-CM | POA: Diagnosis not present

## 2018-07-17 DIAGNOSIS — Z23 Encounter for immunization: Secondary | ICD-10-CM | POA: Diagnosis not present

## 2018-07-17 DIAGNOSIS — Z1389 Encounter for screening for other disorder: Secondary | ICD-10-CM | POA: Diagnosis not present

## 2018-07-17 DIAGNOSIS — I10 Essential (primary) hypertension: Secondary | ICD-10-CM | POA: Diagnosis not present

## 2018-07-17 DIAGNOSIS — K5900 Constipation, unspecified: Secondary | ICD-10-CM | POA: Diagnosis not present

## 2018-07-17 DIAGNOSIS — F0390 Unspecified dementia without behavioral disturbance: Secondary | ICD-10-CM | POA: Diagnosis not present

## 2018-07-17 DIAGNOSIS — I350 Nonrheumatic aortic (valve) stenosis: Secondary | ICD-10-CM | POA: Diagnosis not present

## 2018-07-17 DIAGNOSIS — G253 Myoclonus: Secondary | ICD-10-CM | POA: Diagnosis not present

## 2018-07-17 DIAGNOSIS — Z0000 Encounter for general adult medical examination without abnormal findings: Secondary | ICD-10-CM | POA: Diagnosis not present

## 2018-08-13 DIAGNOSIS — R4182 Altered mental status, unspecified: Secondary | ICD-10-CM | POA: Diagnosis not present

## 2018-08-27 DIAGNOSIS — R1032 Left lower quadrant pain: Secondary | ICD-10-CM | POA: Diagnosis not present

## 2018-09-25 DIAGNOSIS — Z8673 Personal history of transient ischemic attack (TIA), and cerebral infarction without residual deficits: Secondary | ICD-10-CM | POA: Diagnosis not present

## 2018-09-25 DIAGNOSIS — F0390 Unspecified dementia without behavioral disturbance: Secondary | ICD-10-CM | POA: Diagnosis not present

## 2018-09-25 DIAGNOSIS — E1122 Type 2 diabetes mellitus with diabetic chronic kidney disease: Secondary | ICD-10-CM | POA: Diagnosis not present

## 2018-09-25 DIAGNOSIS — I350 Nonrheumatic aortic (valve) stenosis: Secondary | ICD-10-CM | POA: Diagnosis not present

## 2018-09-25 DIAGNOSIS — Z7984 Long term (current) use of oral hypoglycemic drugs: Secondary | ICD-10-CM | POA: Diagnosis not present

## 2018-09-25 DIAGNOSIS — Z9181 History of falling: Secondary | ICD-10-CM | POA: Diagnosis not present

## 2018-09-25 DIAGNOSIS — Z96642 Presence of left artificial hip joint: Secondary | ICD-10-CM | POA: Diagnosis not present

## 2018-09-25 DIAGNOSIS — N183 Chronic kidney disease, stage 3 (moderate): Secondary | ICD-10-CM | POA: Diagnosis not present

## 2018-09-25 DIAGNOSIS — I129 Hypertensive chronic kidney disease with stage 1 through stage 4 chronic kidney disease, or unspecified chronic kidney disease: Secondary | ICD-10-CM | POA: Diagnosis not present

## 2018-10-09 DIAGNOSIS — F0390 Unspecified dementia without behavioral disturbance: Secondary | ICD-10-CM | POA: Diagnosis not present

## 2018-10-09 DIAGNOSIS — Z96642 Presence of left artificial hip joint: Secondary | ICD-10-CM | POA: Diagnosis not present

## 2018-10-09 DIAGNOSIS — Z7984 Long term (current) use of oral hypoglycemic drugs: Secondary | ICD-10-CM | POA: Diagnosis not present

## 2018-10-09 DIAGNOSIS — I129 Hypertensive chronic kidney disease with stage 1 through stage 4 chronic kidney disease, or unspecified chronic kidney disease: Secondary | ICD-10-CM | POA: Diagnosis not present

## 2018-10-09 DIAGNOSIS — Z9181 History of falling: Secondary | ICD-10-CM | POA: Diagnosis not present

## 2018-10-09 DIAGNOSIS — Z8673 Personal history of transient ischemic attack (TIA), and cerebral infarction without residual deficits: Secondary | ICD-10-CM | POA: Diagnosis not present

## 2018-10-09 DIAGNOSIS — E1122 Type 2 diabetes mellitus with diabetic chronic kidney disease: Secondary | ICD-10-CM | POA: Diagnosis not present

## 2018-10-09 DIAGNOSIS — N183 Chronic kidney disease, stage 3 (moderate): Secondary | ICD-10-CM | POA: Diagnosis not present

## 2018-10-09 DIAGNOSIS — I350 Nonrheumatic aortic (valve) stenosis: Secondary | ICD-10-CM | POA: Diagnosis not present

## 2018-10-15 DIAGNOSIS — Z96642 Presence of left artificial hip joint: Secondary | ICD-10-CM | POA: Diagnosis not present

## 2018-10-15 DIAGNOSIS — I350 Nonrheumatic aortic (valve) stenosis: Secondary | ICD-10-CM | POA: Diagnosis not present

## 2018-10-15 DIAGNOSIS — N183 Chronic kidney disease, stage 3 (moderate): Secondary | ICD-10-CM | POA: Diagnosis not present

## 2018-10-15 DIAGNOSIS — Z8673 Personal history of transient ischemic attack (TIA), and cerebral infarction without residual deficits: Secondary | ICD-10-CM | POA: Diagnosis not present

## 2018-10-15 DIAGNOSIS — E1122 Type 2 diabetes mellitus with diabetic chronic kidney disease: Secondary | ICD-10-CM | POA: Diagnosis not present

## 2018-10-15 DIAGNOSIS — I129 Hypertensive chronic kidney disease with stage 1 through stage 4 chronic kidney disease, or unspecified chronic kidney disease: Secondary | ICD-10-CM | POA: Diagnosis not present

## 2018-10-15 DIAGNOSIS — F0390 Unspecified dementia without behavioral disturbance: Secondary | ICD-10-CM | POA: Diagnosis not present

## 2018-10-15 DIAGNOSIS — Z9181 History of falling: Secondary | ICD-10-CM | POA: Diagnosis not present

## 2018-10-15 DIAGNOSIS — Z7984 Long term (current) use of oral hypoglycemic drugs: Secondary | ICD-10-CM | POA: Diagnosis not present

## 2018-10-31 DIAGNOSIS — Z96642 Presence of left artificial hip joint: Secondary | ICD-10-CM | POA: Diagnosis not present

## 2018-10-31 DIAGNOSIS — I350 Nonrheumatic aortic (valve) stenosis: Secondary | ICD-10-CM | POA: Diagnosis not present

## 2018-10-31 DIAGNOSIS — Z7984 Long term (current) use of oral hypoglycemic drugs: Secondary | ICD-10-CM | POA: Diagnosis not present

## 2018-10-31 DIAGNOSIS — N183 Chronic kidney disease, stage 3 (moderate): Secondary | ICD-10-CM | POA: Diagnosis not present

## 2018-10-31 DIAGNOSIS — Z8673 Personal history of transient ischemic attack (TIA), and cerebral infarction without residual deficits: Secondary | ICD-10-CM | POA: Diagnosis not present

## 2018-10-31 DIAGNOSIS — Z9181 History of falling: Secondary | ICD-10-CM | POA: Diagnosis not present

## 2018-10-31 DIAGNOSIS — E1122 Type 2 diabetes mellitus with diabetic chronic kidney disease: Secondary | ICD-10-CM | POA: Diagnosis not present

## 2018-10-31 DIAGNOSIS — I129 Hypertensive chronic kidney disease with stage 1 through stage 4 chronic kidney disease, or unspecified chronic kidney disease: Secondary | ICD-10-CM | POA: Diagnosis not present

## 2018-10-31 DIAGNOSIS — F0390 Unspecified dementia without behavioral disturbance: Secondary | ICD-10-CM | POA: Diagnosis not present

## 2018-11-01 ENCOUNTER — Telehealth: Payer: Self-pay | Admitting: Pulmonary Disease

## 2018-11-01 DIAGNOSIS — G4733 Obstructive sleep apnea (adult) (pediatric): Secondary | ICD-10-CM

## 2018-11-01 NOTE — Telephone Encounter (Signed)
Spoke with Almyra Free about patient's CPAP issues. Advised her that he is overdue for a follow up appointment. She has requested that I call the patient's wife to arrange this.   Spoke with Marlowe Kays. She advised that he is almost 85yrs old and would be almost impossible to get him in for an appt. He has lost almost 30 pounds in the past year.   She stated that since he keeps losing weight, his mask is not fitting properly. She also feels that the pressure is too much for him. He has not being wearing the machine recently due to the discomfort.   D/L has been printed and placed in RA's folder.   RA's please advise. Thanks!

## 2018-11-04 NOTE — Telephone Encounter (Signed)
CPAP download shows average pressure of 10 cm. Current settings auto 8 to 11 cm. Change to auto 5 to 10 cm

## 2018-11-04 NOTE — Telephone Encounter (Signed)
Order placed for the cpap setting change. Nothing further needed.

## 2018-11-06 NOTE — Addendum Note (Signed)
Addended by: Lorretta Harp on: 11/06/2018 09:04 AM   Modules accepted: Orders

## 2018-11-08 ENCOUNTER — Other Ambulatory Visit: Payer: Self-pay | Admitting: Internal Medicine

## 2018-11-08 ENCOUNTER — Other Ambulatory Visit (HOSPITAL_COMMUNITY): Payer: Self-pay | Admitting: Internal Medicine

## 2018-11-08 ENCOUNTER — Ambulatory Visit
Admission: RE | Admit: 2018-11-08 | Discharge: 2018-11-08 | Disposition: A | Discharge status: Home / Self Care | Payer: PPO | Source: Ambulatory Visit | Attending: Internal Medicine | Admitting: Internal Medicine

## 2018-11-08 DIAGNOSIS — J90 Pleural effusion, not elsewhere classified: Secondary | ICD-10-CM

## 2018-11-08 DIAGNOSIS — Z79899 Other long term (current) drug therapy: Secondary | ICD-10-CM | POA: Diagnosis not present

## 2018-11-08 DIAGNOSIS — R05 Cough: Secondary | ICD-10-CM | POA: Diagnosis not present

## 2018-11-08 DIAGNOSIS — I350 Nonrheumatic aortic (valve) stenosis: Secondary | ICD-10-CM | POA: Diagnosis not present

## 2018-11-08 DIAGNOSIS — E119 Type 2 diabetes mellitus without complications: Secondary | ICD-10-CM

## 2018-11-08 DIAGNOSIS — I5031 Acute diastolic (congestive) heart failure: Secondary | ICD-10-CM | POA: Diagnosis not present

## 2018-11-08 DIAGNOSIS — R0609 Other forms of dyspnea: Secondary | ICD-10-CM | POA: Diagnosis not present

## 2018-11-12 ENCOUNTER — Ambulatory Visit (HOSPITAL_COMMUNITY)
Admission: RE | Admit: 2018-11-12 | Discharge: 2018-11-12 | Disposition: A | Discharge status: Home / Self Care | Payer: PPO | Source: Ambulatory Visit | Attending: Physician Assistant | Admitting: Physician Assistant

## 2018-11-12 ENCOUNTER — Other Ambulatory Visit (HOSPITAL_COMMUNITY): Payer: Self-pay | Admitting: Physician Assistant

## 2018-11-12 ENCOUNTER — Ambulatory Visit (HOSPITAL_COMMUNITY)
Admission: RE | Admit: 2018-11-12 | Discharge: 2018-11-12 | Disposition: A | Discharge status: Home / Self Care | Payer: PPO | Source: Ambulatory Visit | Attending: Internal Medicine | Admitting: Internal Medicine

## 2018-11-12 ENCOUNTER — Encounter (HOSPITAL_COMMUNITY): Payer: Self-pay | Admitting: Physician Assistant

## 2018-11-12 DIAGNOSIS — J948 Other specified pleural conditions: Secondary | ICD-10-CM | POA: Diagnosis not present

## 2018-11-12 DIAGNOSIS — J90 Pleural effusion, not elsewhere classified: Secondary | ICD-10-CM | POA: Insufficient documentation

## 2018-11-12 HISTORY — PX: IR THORACENTESIS ASP PLEURAL SPACE W/IMG GUIDE: IMG5380

## 2018-11-12 LAB — BODY FLUID CELL COUNT WITH DIFFERENTIAL
Lymphs, Fluid: 22 %
Monocyte-Macrophage-Serous Fluid: 78 % (ref 50–90)
WBC FLUID: 100 uL (ref 0–1000)

## 2018-11-12 LAB — GLUCOSE, PLEURAL OR PERITONEAL FLUID: Glucose, Fluid: 151 mg/dL

## 2018-11-12 LAB — PROTEIN, PLEURAL OR PERITONEAL FLUID: Total protein, fluid: 5.3 g/dL

## 2018-11-12 LAB — LACTATE DEHYDROGENASE, PLEURAL OR PERITONEAL FLUID: LD, Fluid: 72 U/L — ABNORMAL HIGH (ref 3–23)

## 2018-11-12 MED ORDER — LIDOCAINE HCL 1 % IJ SOLN
INTRAMUSCULAR | Status: AC
  Filled 2018-11-12: qty 20

## 2018-11-12 MED ORDER — LIDOCAINE HCL 1 % IJ SOLN
INTRAMUSCULAR | Status: DC | PRN
  Administered 2018-11-12: 10 mL

## 2018-11-12 NOTE — Procedures (Signed)
PROCEDURE SUMMARY:  Successful US guided left thoracentesis. Yielded 2 L of clear yellow fluid. Patient tolerated procedure well. No immediate complications. EBL = trace  Specimen was sent for labs.  Post procedure chest X-ray reveals no pneumothorax  [name] S Boyce Keltner PA-C 11/12/2018 10:44 AM

## 2018-11-13 ENCOUNTER — Other Ambulatory Visit (HOSPITAL_COMMUNITY): Payer: Self-pay | Admitting: Internal Medicine

## 2018-11-13 ENCOUNTER — Other Ambulatory Visit: Payer: Self-pay | Admitting: Internal Medicine

## 2018-11-13 DIAGNOSIS — J90 Pleural effusion, not elsewhere classified: Secondary | ICD-10-CM

## 2018-11-13 LAB — ACID FAST SMEAR (AFB): ACID FAST SMEAR - AFSCU2: NEGATIVE

## 2018-11-13 LAB — ACID FAST SMEAR (AFB, MYCOBACTERIA)

## 2018-11-15 ENCOUNTER — Ambulatory Visit (HOSPITAL_COMMUNITY)
Admission: RE | Admit: 2018-11-15 | Discharge: 2018-11-15 | Disposition: A | Discharge status: Home / Self Care | Payer: PPO | Source: Ambulatory Visit | Attending: Internal Medicine | Admitting: Internal Medicine

## 2018-11-15 ENCOUNTER — Encounter (HOSPITAL_COMMUNITY): Payer: Self-pay | Admitting: Radiology

## 2018-11-15 DIAGNOSIS — R918 Other nonspecific abnormal finding of lung field: Secondary | ICD-10-CM | POA: Diagnosis not present

## 2018-11-15 DIAGNOSIS — I313 Pericardial effusion (noninflammatory): Secondary | ICD-10-CM | POA: Diagnosis not present

## 2018-11-15 DIAGNOSIS — J90 Pleural effusion, not elsewhere classified: Secondary | ICD-10-CM | POA: Insufficient documentation

## 2018-11-15 HISTORY — PX: IR THORACENTESIS ASP PLEURAL SPACE W/IMG GUIDE: IMG5380

## 2018-11-15 MED ORDER — LIDOCAINE HCL 1 % IJ SOLN
INTRAMUSCULAR | Status: AC
  Filled 2018-11-15: qty 20

## 2018-11-15 MED ORDER — LIDOCAINE HCL 1 % IJ SOLN
INTRAMUSCULAR | Status: AC | PRN
  Administered 2018-11-15: 10 mL

## 2018-11-15 NOTE — Procedures (Signed)
PROCEDURE SUMMARY:  Successful US guided left thoracentesis. Yielded 2.4 L of clear yellow fluid. Pt tolerated procedure well. No immediate complications.  Specimen was sent for labs. CXR ordered.  EBL < 5 mL  Ascencion Dike PA-C 11/15/2018 1:23 PM

## 2018-11-19 DIAGNOSIS — I350 Nonrheumatic aortic (valve) stenosis: Secondary | ICD-10-CM | POA: Diagnosis not present

## 2018-11-19 DIAGNOSIS — R269 Unspecified abnormalities of gait and mobility: Secondary | ICD-10-CM | POA: Diagnosis not present

## 2018-11-19 DIAGNOSIS — R4182 Altered mental status, unspecified: Secondary | ICD-10-CM | POA: Diagnosis not present

## 2018-11-19 DIAGNOSIS — R0682 Tachypnea, not elsewhere classified: Secondary | ICD-10-CM | POA: Diagnosis not present

## 2018-11-19 DIAGNOSIS — J90 Pleural effusion, not elsewhere classified: Secondary | ICD-10-CM | POA: Diagnosis not present

## 2018-11-20 LAB — CHOLESTEROL, BODY FLUID: Cholesterol, Fluid: 77 mg/dL

## 2018-12-18 ENCOUNTER — Other Ambulatory Visit (HOSPITAL_COMMUNITY): Payer: Self-pay | Admitting: Hospice and Palliative Medicine

## 2018-12-18 ENCOUNTER — Other Ambulatory Visit: Payer: Self-pay | Admitting: Hospice and Palliative Medicine

## 2018-12-18 DIAGNOSIS — R0602 Shortness of breath: Secondary | ICD-10-CM

## 2018-12-18 DIAGNOSIS — R188 Other ascites: Secondary | ICD-10-CM

## 2018-12-19 ENCOUNTER — Other Ambulatory Visit (HOSPITAL_COMMUNITY): Payer: Self-pay | Admitting: Hospice and Palliative Medicine

## 2018-12-19 DIAGNOSIS — R0602 Shortness of breath: Secondary | ICD-10-CM

## 2018-12-19 DIAGNOSIS — J90 Pleural effusion, not elsewhere classified: Secondary | ICD-10-CM

## 2018-12-20 ENCOUNTER — Ambulatory Visit (HOSPITAL_COMMUNITY)
Admission: RE | Admit: 2018-12-20 | Discharge: 2018-12-20 | Disposition: A | Discharge status: Home / Self Care | Source: Ambulatory Visit | Attending: Hospice and Palliative Medicine | Admitting: Hospice and Palliative Medicine

## 2018-12-20 ENCOUNTER — Ambulatory Visit (HOSPITAL_COMMUNITY)
Admission: RE | Admit: 2018-12-20 | Discharge: 2018-12-20 | Disposition: A | Discharge status: Home / Self Care | Source: Ambulatory Visit | Attending: Radiology | Admitting: Radiology

## 2018-12-20 DIAGNOSIS — R0602 Shortness of breath: Secondary | ICD-10-CM | POA: Diagnosis not present

## 2018-12-20 DIAGNOSIS — J90 Pleural effusion, not elsewhere classified: Secondary | ICD-10-CM

## 2018-12-20 DIAGNOSIS — Z9889 Other specified postprocedural states: Secondary | ICD-10-CM | POA: Diagnosis not present

## 2018-12-20 MED ORDER — LIDOCAINE HCL 1 % IJ SOLN
INTRAMUSCULAR | Status: AC
  Filled 2018-12-20: qty 20

## 2018-12-20 NOTE — Procedures (Signed)
Ultrasound-guided therapeutic left thoracentesis performed yielding 2.4 liters of yellow fluid. No immediate complications. Follow-up chest x-ray pending. EBL none.

## 2018-12-25 LAB — ACID FAST CULTURE WITH REFLEXED SENSITIVITIES: ACID FAST CULTURE - AFSCU3: NEGATIVE

## 2018-12-30 DIAGNOSIS — N390 Urinary tract infection, site not specified: Secondary | ICD-10-CM | POA: Diagnosis not present

## 2019-02-06 ENCOUNTER — Other Ambulatory Visit (HOSPITAL_COMMUNITY): Payer: Self-pay | Admitting: Internal Medicine

## 2019-02-06 DIAGNOSIS — R0602 Shortness of breath: Secondary | ICD-10-CM

## 2019-02-06 DIAGNOSIS — I350 Nonrheumatic aortic (valve) stenosis: Secondary | ICD-10-CM | POA: Diagnosis not present

## 2019-02-06 DIAGNOSIS — R0600 Dyspnea, unspecified: Secondary | ICD-10-CM | POA: Diagnosis not present

## 2019-02-07 ENCOUNTER — Other Ambulatory Visit (HOSPITAL_COMMUNITY): Payer: Self-pay | Admitting: Student

## 2019-02-07 ENCOUNTER — Other Ambulatory Visit: Payer: Self-pay

## 2019-02-07 ENCOUNTER — Ambulatory Visit (HOSPITAL_COMMUNITY)
Admission: RE | Admit: 2019-02-07 | Discharge: 2019-02-07 | Disposition: A | Discharge status: Home / Self Care | Source: Ambulatory Visit | Attending: Student | Admitting: Student

## 2019-02-07 ENCOUNTER — Encounter (HOSPITAL_COMMUNITY): Payer: Self-pay | Admitting: Student

## 2019-02-07 ENCOUNTER — Ambulatory Visit (HOSPITAL_COMMUNITY)
Admission: RE | Admit: 2019-02-07 | Discharge: 2019-02-07 | Disposition: A | Discharge status: Home / Self Care | Source: Ambulatory Visit | Attending: Internal Medicine | Admitting: Internal Medicine

## 2019-02-07 DIAGNOSIS — R0602 Shortness of breath: Secondary | ICD-10-CM | POA: Diagnosis present

## 2019-02-07 DIAGNOSIS — Z9889 Other specified postprocedural states: Secondary | ICD-10-CM

## 2019-02-07 DIAGNOSIS — J90 Pleural effusion, not elsewhere classified: Secondary | ICD-10-CM | POA: Diagnosis not present

## 2019-02-07 HISTORY — PX: IR THORACENTESIS ASP PLEURAL SPACE W/IMG GUIDE: IMG5380

## 2019-02-07 MED ORDER — LIDOCAINE HCL 1 % IJ SOLN
INTRAMUSCULAR | Status: AC
  Filled 2019-02-07: qty 20

## 2019-02-07 MED ORDER — LIDOCAINE HCL 1 % IJ SOLN
INTRAMUSCULAR | Status: DC | PRN
  Administered 2019-02-07: 10 mL

## 2019-02-07 NOTE — Procedures (Signed)
PROCEDURE SUMMARY:  Successful US guided therapeutic left thoracentesis. Yielded 1.8 liters of yellow fluid. Pt tolerated procedure well. No immediate complications.  Specimen was not sent for labs. CXR ordered.  EBL < 5 mL  Docia Barrier PA-C 02/07/2019 10:58 AM

## 2019-03-24 DEATH — deceased
